# Patient Record
Sex: Female | Born: 1945 | Race: White | Hispanic: No | Marital: Married | Smoking: Former smoker
Health system: Southern US, Community
[De-identification: ages and names within clinical notes are randomized; demographics above are authoritative.]

## PROBLEM LIST (undated history)

## (undated) DIAGNOSIS — I10 Essential (primary) hypertension: Secondary | ICD-10-CM

## (undated) DIAGNOSIS — F419 Anxiety disorder, unspecified: Secondary | ICD-10-CM

## (undated) DIAGNOSIS — E785 Hyperlipidemia, unspecified: Secondary | ICD-10-CM

## (undated) DIAGNOSIS — N3281 Overactive bladder: Secondary | ICD-10-CM

## (undated) DIAGNOSIS — M1990 Unspecified osteoarthritis, unspecified site: Secondary | ICD-10-CM

## (undated) DIAGNOSIS — F32A Depression, unspecified: Secondary | ICD-10-CM

## (undated) DIAGNOSIS — S82899A Other fracture of unspecified lower leg, initial encounter for closed fracture: Secondary | ICD-10-CM

## (undated) DIAGNOSIS — F329 Major depressive disorder, single episode, unspecified: Secondary | ICD-10-CM

## (undated) DIAGNOSIS — K5792 Diverticulitis of intestine, part unspecified, without perforation or abscess without bleeding: Secondary | ICD-10-CM

## (undated) DIAGNOSIS — F319 Bipolar disorder, unspecified: Secondary | ICD-10-CM

## (undated) HISTORY — PX: COLONOSCOPY: SHX174

## (undated) HISTORY — PX: JOINT REPLACEMENT: SHX530

---
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
## 1974-09-19 HISTORY — PX: BARTHOLIN GLAND CYST EXCISION: SHX565

## 1993-09-19 HISTORY — PX: ELBOW SURGERY: SHX618

## 1996-09-19 HISTORY — PX: ABDOMINAL HYSTERECTOMY: SHX81

## 1998-09-19 HISTORY — PX: KNEE SURGERY: SHX244

## 2006-09-19 HISTORY — PX: TOTAL HIP ARTHROPLASTY: SHX124

## 2007-09-20 HISTORY — PX: JOINT REPLACEMENT: SHX530

## 2007-09-20 HISTORY — PX: PELVIC FLOOR REPAIR: SHX2192

## 2009-09-19 HISTORY — PX: JOINT REPLACEMENT: SHX530

## 2013-09-18 ENCOUNTER — Emergency Department: Payer: Self-pay | Admitting: Emergency Medicine

## 2013-09-18 LAB — TSH: Thyroid Stimulating Horm: 0.95 u[IU]/mL

## 2013-09-18 LAB — CBC
HCT: 38.4 % (ref 35.0–47.0)
HGB: 12.9 g/dL (ref 12.0–16.0)
MCH: 31.9 pg (ref 26.0–34.0)
MCHC: 33.6 g/dL (ref 32.0–36.0)
MCV: 95 fL (ref 80–100)
Platelet: 233 10*3/uL (ref 150–440)
RBC: 4.06 10*6/uL (ref 3.80–5.20)
RDW: 13.4 % (ref 11.5–14.5)
WBC: 6.2 10*3/uL (ref 3.6–11.0)

## 2013-09-18 LAB — COMPREHENSIVE METABOLIC PANEL
Albumin: 3.7 g/dL (ref 3.4–5.0)
Alkaline Phosphatase: 80 U/L
Anion Gap: 4 — ABNORMAL LOW (ref 7–16)
BUN: 17 mg/dL (ref 7–18)
Bilirubin,Total: 0.5 mg/dL (ref 0.2–1.0)
Calcium, Total: 8.9 mg/dL (ref 8.5–10.1)
Chloride: 100 mmol/L (ref 98–107)
Co2: 29 mmol/L (ref 21–32)
Creatinine: 0.86 mg/dL (ref 0.60–1.30)
EGFR (African American): >60
EGFR (Non-African Amer.): >60
Glucose: 97 mg/dL (ref 65–99)
Osmolality: 268 (ref 275–301)
Potassium: 3.9 mmol/L (ref 3.5–5.1)
SGOT(AST): 30 U/L (ref 15–37)
SGPT (ALT): 27 U/L (ref 12–78)
Sodium: 133 mmol/L — ABNORMAL LOW (ref 136–145)
Total Protein: 7.3 g/dL (ref 6.4–8.2)

## 2013-09-18 LAB — URINALYSIS, COMPLETE
Bilirubin,UR: NEGATIVE
Blood: NEGATIVE
Glucose,UR: NEGATIVE mg/dL (ref 0–75)
Ketone: NEGATIVE
Leukocyte Esterase: NEGATIVE
Nitrite: NEGATIVE
Ph: 8 (ref 4.5–8.0)
Protein: NEGATIVE
RBC,UR: 1 /HPF (ref 0–5)
Specific Gravity: 1.004 (ref 1.003–1.030)
Squamous Epithelial: <1
WBC UR: NONE SEEN /HPF (ref 0–5)

## 2013-09-18 LAB — ACETAMINOPHEN LEVEL: Acetaminophen: <2 ug/mL

## 2013-09-18 LAB — DRUG SCREEN, URINE
Amphetamines, Ur Screen: POSITIVE (ref ?–1000)
Barbiturates, Ur Screen: NEGATIVE (ref ?–200)
Benzodiazepine, Ur Scrn: NEGATIVE (ref ?–200)
Cannabinoid 50 Ng, Ur ~~LOC~~: NEGATIVE (ref ?–50)
Cocaine Metabolite,Ur ~~LOC~~: NEGATIVE (ref ?–300)
MDMA (Ecstasy)Ur Screen: POSITIVE (ref ?–500)
Methadone, Ur Screen: NEGATIVE (ref ?–300)
Opiate, Ur Screen: NEGATIVE (ref ?–300)
Phencyclidine (PCP) Ur S: NEGATIVE (ref ?–25)
Tricyclic, Ur Screen: NEGATIVE (ref ?–1000)

## 2013-09-18 LAB — PROTIME-INR
INR: 1
Prothrombin Time: 12.6 secs (ref 11.5–14.7)

## 2013-09-18 LAB — SALICYLATE LEVEL: Salicylates, Serum: <1.7 mg/dL

## 2013-09-18 LAB — ETHANOL
Ethanol %: <0.003 % (ref 0.000–0.080)
Ethanol: <3 mg/dL

## 2013-09-18 LAB — CK TOTAL AND CKMB (NOT AT ARMC)
CK, Total: 45 U/L (ref 21–215)
CK-MB: 0.5 ng/mL (ref 0.5–3.6)

## 2014-09-19 HISTORY — PX: REPLACEMENT TOTAL KNEE: SUR1224

## 2016-10-31 DIAGNOSIS — I10 Essential (primary) hypertension: Secondary | ICD-10-CM | POA: Insufficient documentation

## 2016-10-31 DIAGNOSIS — F319 Bipolar disorder, unspecified: Secondary | ICD-10-CM | POA: Insufficient documentation

## 2016-10-31 DIAGNOSIS — M159 Polyosteoarthritis, unspecified: Secondary | ICD-10-CM | POA: Insufficient documentation

## 2017-01-04 ENCOUNTER — Encounter: Payer: Self-pay | Admitting: *Deleted

## 2017-01-04 ENCOUNTER — Encounter
Admission: RE | Admit: 2017-01-04 | Discharge: 2017-01-04 | Disposition: A | Discharge status: Home / Self Care | Payer: Medicare Other | Source: Ambulatory Visit | Attending: Orthopedic Surgery | Admitting: Orthopedic Surgery

## 2017-01-04 DIAGNOSIS — F419 Anxiety disorder, unspecified: Secondary | ICD-10-CM | POA: Diagnosis not present

## 2017-01-04 DIAGNOSIS — Z01810 Encounter for preprocedural cardiovascular examination: Secondary | ICD-10-CM | POA: Diagnosis present

## 2017-01-04 DIAGNOSIS — N3281 Overactive bladder: Secondary | ICD-10-CM | POA: Insufficient documentation

## 2017-01-04 DIAGNOSIS — M1990 Unspecified osteoarthritis, unspecified site: Secondary | ICD-10-CM | POA: Diagnosis not present

## 2017-01-04 DIAGNOSIS — Z01812 Encounter for preprocedural laboratory examination: Secondary | ICD-10-CM | POA: Diagnosis not present

## 2017-01-04 DIAGNOSIS — F319 Bipolar disorder, unspecified: Secondary | ICD-10-CM | POA: Diagnosis not present

## 2017-01-04 DIAGNOSIS — I10 Essential (primary) hypertension: Secondary | ICD-10-CM | POA: Diagnosis not present

## 2017-01-04 DIAGNOSIS — E785 Hyperlipidemia, unspecified: Secondary | ICD-10-CM | POA: Insufficient documentation

## 2017-01-04 DIAGNOSIS — K5792 Diverticulitis of intestine, part unspecified, without perforation or abscess without bleeding: Secondary | ICD-10-CM | POA: Insufficient documentation

## 2017-01-04 HISTORY — DX: Hyperlipidemia, unspecified: E78.5

## 2017-01-04 HISTORY — DX: Unspecified osteoarthritis, unspecified site: M19.90

## 2017-01-04 HISTORY — DX: Diverticulitis of intestine, part unspecified, without perforation or abscess without bleeding: K57.92

## 2017-01-04 HISTORY — DX: Overactive bladder: N32.81

## 2017-01-04 HISTORY — DX: Depression, unspecified: F32.A

## 2017-01-04 HISTORY — DX: Major depressive disorder, single episode, unspecified: F32.9

## 2017-01-04 HISTORY — DX: Essential (primary) hypertension: I10

## 2017-01-04 HISTORY — DX: Anxiety disorder, unspecified: F41.9

## 2017-01-04 HISTORY — DX: Bipolar disorder, unspecified: F31.9

## 2017-01-04 HISTORY — DX: Other fracture of unspecified lower leg, initial encounter for closed fracture: S82.899A

## 2017-01-04 LAB — URINALYSIS, ROUTINE W REFLEX MICROSCOPIC
Bilirubin Urine: NEGATIVE
Glucose, UA: NEGATIVE mg/dL
Hgb urine dipstick: NEGATIVE
Ketones, ur: NEGATIVE mg/dL
Leukocytes, UA: NEGATIVE
Nitrite: NEGATIVE
Protein, ur: NEGATIVE mg/dL
Specific Gravity, Urine: 1.005 (ref 1.005–1.030)
pH: 7 (ref 5.0–8.0)

## 2017-01-04 LAB — CBC
HCT: 40.4 % (ref 35.0–47.0)
Hemoglobin: 13.5 g/dL (ref 12.0–16.0)
MCH: 32 pg (ref 26.0–34.0)
MCHC: 33.5 g/dL (ref 32.0–36.0)
MCV: 95.6 fL (ref 80.0–100.0)
Platelets: 246 10*3/uL (ref 150–440)
RBC: 4.22 MIL/uL (ref 3.80–5.20)
RDW: 13.5 % (ref 11.5–14.5)
WBC: 10.6 10*3/uL (ref 3.6–11.0)

## 2017-01-04 LAB — BASIC METABOLIC PANEL
Anion gap: 9 (ref 5–15)
BUN: 19 mg/dL (ref 6–20)
CO2: 31 mmol/L (ref 22–32)
Calcium: 10.4 mg/dL — ABNORMAL HIGH (ref 8.9–10.3)
Chloride: 95 mmol/L — ABNORMAL LOW (ref 101–111)
Creatinine, Ser: 0.97 mg/dL (ref 0.44–1.00)
GFR calc Af Amer: >60 mL/min (ref >60)
GFR calc non Af Amer: 58 mL/min — ABNORMAL LOW (ref >60)
Glucose, Bld: 97 mg/dL (ref 65–99)
Potassium: 3.6 mmol/L (ref 3.5–5.1)
Sodium: 135 mmol/L (ref 135–145)

## 2017-01-04 LAB — PROTIME-INR
INR: 0.99
Prothrombin Time: 13.1 seconds (ref 11.4–15.2)

## 2017-01-04 LAB — SEDIMENTATION RATE: Sed Rate: 12 mm/hr (ref 0–30)

## 2017-01-04 LAB — TYPE AND SCREEN
ABO/RH(D): AB POS
Antibody Screen: NEGATIVE

## 2017-01-04 LAB — SURGICAL PCR SCREEN
MRSA, PCR: NEGATIVE
Staphylococcus aureus: NEGATIVE

## 2017-01-04 LAB — APTT: aPTT: 26 seconds (ref 24–36)

## 2017-01-04 NOTE — Patient Instructions (Signed)
  Your procedure is scheduled on: Tuesday, January 10, 2017 Report to Same Day Surgery 2nd floor medical mall (Medical Mall Entrance-take elevator on left to 2nd floor.  Check in with surgery information desk.) To find out your arrival time please call [phone] between 1PM - 3PM on Monday, April 23rd  Remember: Instructions that are not followed completely may result in serious medical risk, up to and including death, or upon the discretion of your surgeon and anesthesiologist your surgery may need to be rescheduled.    _x___ 1. Do not eat food or drink liquids after midnight. No gum chewing or hard candies.     __x__ 2. No Alcohol for 24 hours before or after surgery.   __x__3. No Smoking for 24 prior to surgery.   ____  4. Bring all medications with you on the day of surgery if instructed.    __x__ 5. Notify your doctor if there is any change in your medical condition     (cold, fever, infections).     Do not wear jewelry, make-up, hairpins, clips or nail polish.  Do not wear lotions, powders, or perfumes. You may wear deodorant.  Do not shave 48 hours prior to surgery. Men may shave face and neck.  Do not bring valuables to the hospital.    [facility] is not responsible for any belongings or valuables.               Contacts, dentures or bridgework may not be worn into surgery.  Leave your suitcase in the car. After surgery it may be brought to your room.  For patients admitted to the hospital, discharge time is determined by your treatment team.   Patients discharged the day of surgery will not be allowed to drive home.  You will need someone to drive you home and stay with you the night of your procedure.    Please read over the following fact sheets that you were given:   [facility] Preparing for Surgery and or MRSA Information   _x___ Take these medications the morning of surgery with a SIP of water only:   1. CELEXA  2. PROPRANOLOL  3.  4.  5.  6.   _x___ Use  CHG Soap or sage wipes as directed on instruction sheet   X____Stop Anti-inflammatories such as Advil, Aleve, Ibuprofen, Motrin, Naproxen, Naprosyn, Goodies powders or aspirin products. OK to take Tylenol.   _x___ Stop supplements until after surgery.  But may continue Vitamin D, Vitamin B,  and multivitamin.

## 2017-01-05 LAB — URINE CULTURE: Culture: NO GROWTH

## 2017-01-10 ENCOUNTER — Inpatient Hospital Stay: Payer: Medicare Other | Admitting: Anesthesiology

## 2017-01-10 ENCOUNTER — Inpatient Hospital Stay: Payer: Medicare Other

## 2017-01-10 ENCOUNTER — Inpatient Hospital Stay
Admission: RE | Admit: 2017-01-10 | Discharge: 2017-01-12 | DRG: 470 | Disposition: A | Discharge status: Home Health | Payer: Medicare Other | Source: Ambulatory Visit | Attending: Orthopedic Surgery | Admitting: Orthopedic Surgery

## 2017-01-10 ENCOUNTER — Encounter: Payer: Self-pay | Admitting: *Deleted

## 2017-01-10 ENCOUNTER — Encounter
Admission: RE | Disposition: A | Discharge status: Home Health | Payer: Self-pay | Source: Ambulatory Visit | Attending: Orthopedic Surgery

## 2017-01-10 DIAGNOSIS — F319 Bipolar disorder, unspecified: Secondary | ICD-10-CM | POA: Diagnosis present

## 2017-01-10 DIAGNOSIS — D62 Acute posthemorrhagic anemia: Secondary | ICD-10-CM | POA: Diagnosis not present

## 2017-01-10 DIAGNOSIS — Z87891 Personal history of nicotine dependence: Secondary | ICD-10-CM

## 2017-01-10 DIAGNOSIS — M1611 Unilateral primary osteoarthritis, right hip: Secondary | ICD-10-CM | POA: Diagnosis present

## 2017-01-10 DIAGNOSIS — I10 Essential (primary) hypertension: Secondary | ICD-10-CM | POA: Diagnosis present

## 2017-01-10 DIAGNOSIS — N3281 Overactive bladder: Secondary | ICD-10-CM | POA: Diagnosis present

## 2017-01-10 DIAGNOSIS — E785 Hyperlipidemia, unspecified: Secondary | ICD-10-CM | POA: Diagnosis present

## 2017-01-10 DIAGNOSIS — M25551 Pain in right hip: Secondary | ICD-10-CM | POA: Diagnosis present

## 2017-01-10 DIAGNOSIS — G8918 Other acute postprocedural pain: Secondary | ICD-10-CM

## 2017-01-10 DIAGNOSIS — F419 Anxiety disorder, unspecified: Secondary | ICD-10-CM | POA: Diagnosis present

## 2017-01-10 DIAGNOSIS — Z419 Encounter for procedure for purposes other than remedying health state, unspecified: Secondary | ICD-10-CM

## 2017-01-10 HISTORY — PX: TOTAL HIP ARTHROPLASTY: SHX124

## 2017-01-10 LAB — CREATININE, SERUM
Creatinine, Ser: 0.95 mg/dL (ref 0.44–1.00)
GFR calc Af Amer: >60 mL/min (ref >60)
GFR calc non Af Amer: 59 mL/min — ABNORMAL LOW (ref >60)

## 2017-01-10 LAB — CBC
HCT: 34.3 % — ABNORMAL LOW (ref 35.0–47.0)
Hemoglobin: 11.5 g/dL — ABNORMAL LOW (ref 12.0–16.0)
MCH: 31.8 pg (ref 26.0–34.0)
MCHC: 33.6 g/dL (ref 32.0–36.0)
MCV: 94.6 fL (ref 80.0–100.0)
Platelets: 202 10*3/uL (ref 150–440)
RBC: 3.63 MIL/uL — ABNORMAL LOW (ref 3.80–5.20)
RDW: 13.6 % (ref 11.5–14.5)
WBC: 11.5 10*3/uL — ABNORMAL HIGH (ref 3.6–11.0)

## 2017-01-10 LAB — ABO/RH: ABO/RH(D): AB POS

## 2017-01-10 SURGERY — ARTHROPLASTY, HIP, TOTAL, ANTERIOR APPROACH
Anesthesia: Spinal | Site: Hip | Laterality: Right | Wound class: Clean

## 2017-01-10 MED ORDER — CALCIUM CARBONATE ANTACID 500 MG PO CHEW
400.0000 mg | CHEWABLE_TABLET | Freq: Every day | ORAL | Status: DC
  Administered 2017-01-11 – 2017-01-12 (×2): 400 mg via ORAL
  Filled 2017-01-10 (×2): qty 2

## 2017-01-10 MED ORDER — ACETAMINOPHEN 325 MG PO TABS: 650.0000 mg | ORAL_TABLET | Freq: Four times a day (QID) | ORAL | Status: DC | PRN

## 2017-01-10 MED ORDER — TRANEXAMIC ACID 1000 MG/10ML IV SOLN
1000.0000 mg | INTRAVENOUS | Status: DC
  Filled 2017-01-10: qty 10

## 2017-01-10 MED ORDER — ESTRADIOL 1 MG PO TABS
1.0000 mg | ORAL_TABLET | ORAL | Status: DC
  Administered 2017-01-12: 1 mg via ORAL
  Filled 2017-01-10: qty 1

## 2017-01-10 MED ORDER — PROPOFOL 500 MG/50ML IV EMUL
INTRAVENOUS | Status: DC | PRN
  Administered 2017-01-10: 25 ug/kg/min via INTRAVENOUS

## 2017-01-10 MED ORDER — CITALOPRAM HYDROBROMIDE 20 MG PO TABS
20.0000 mg | ORAL_TABLET | Freq: Every day | ORAL | Status: DC
  Administered 2017-01-10 – 2017-01-12 (×3): 20 mg via ORAL
  Filled 2017-01-10 (×3): qty 1

## 2017-01-10 MED ORDER — ONDANSETRON HCL 4 MG PO TABS: 4.0000 mg | ORAL_TABLET | Freq: Four times a day (QID) | ORAL | Status: DC | PRN

## 2017-01-10 MED ORDER — PHENOL 1.4 % MT LIQD
1.0000 | OROMUCOSAL | Status: DC | PRN
Start: 2017-01-10 — End: 2017-01-12

## 2017-01-10 MED ORDER — MORPHINE SULFATE (PF) 2 MG/ML IV SOLN: 2.0000 mg | INTRAVENOUS | Status: DC | PRN

## 2017-01-10 MED ORDER — FAMOTIDINE 20 MG PO TABS
ORAL_TABLET | ORAL | Status: AC
  Filled 2017-01-10: qty 1

## 2017-01-10 MED ORDER — MENTHOL 3 MG MT LOZG: 1.0000 | LOZENGE | OROMUCOSAL | Status: DC | PRN

## 2017-01-10 MED ORDER — EPHEDRINE SULFATE 50 MG/ML IJ SOLN
INTRAMUSCULAR | Status: DC | PRN
  Administered 2017-01-10: 5 mg via INTRAVENOUS
  Administered 2017-01-10: 10 mg via INTRAVENOUS

## 2017-01-10 MED ORDER — ENOXAPARIN SODIUM 40 MG/0.4ML ~~LOC~~ SOLN
40.0000 mg | SUBCUTANEOUS | Status: DC
  Administered 2017-01-11 – 2017-01-12 (×2): 40 mg via SUBCUTANEOUS
  Filled 2017-01-10 (×2): qty 0.4

## 2017-01-10 MED ORDER — BUPIVACAINE-EPINEPHRINE 0.25% -1:200000 IJ SOLN
INTRAMUSCULAR | Status: DC | PRN
  Administered 2017-01-10: 30 mL

## 2017-01-10 MED ORDER — OXYCODONE HCL 5 MG PO TABS: 5.0000 mg | ORAL_TABLET | ORAL | Status: DC | PRN

## 2017-01-10 MED ORDER — TEMAZEPAM 15 MG PO CAPS
15.0000 mg | ORAL_CAPSULE | Freq: Every day | ORAL | Status: DC
  Administered 2017-01-10 – 2017-01-11 (×2): 15 mg via ORAL
  Filled 2017-01-10 (×2): qty 1

## 2017-01-10 MED ORDER — SPIRONOLACTONE 100 MG PO TABS
100.0000 mg | ORAL_TABLET | Freq: Every day | ORAL | Status: DC
  Administered 2017-01-10 – 2017-01-11 (×2): 100 mg via ORAL
  Filled 2017-01-10 (×2): qty 1

## 2017-01-10 MED ORDER — SODIUM CHLORIDE 0.9 % IV SOLN
INTRAVENOUS | Status: DC
  Administered 2017-01-10 – 2017-01-11 (×3): via INTRAVENOUS

## 2017-01-10 MED ORDER — METHOCARBAMOL 500 MG PO TABS: 500.0000 mg | ORAL_TABLET | Freq: Four times a day (QID) | ORAL | Status: DC | PRN

## 2017-01-10 MED ORDER — MAGNESIUM HYDROXIDE 400 MG/5ML PO SUSP
30.0000 mL | Freq: Every day | ORAL | Status: DC | PRN
  Administered 2017-01-12: 30 mL via ORAL
  Filled 2017-01-10: qty 30

## 2017-01-10 MED ORDER — MAGNESIUM CITRATE PO SOLN
1.0000 | Freq: Once | ORAL | Status: DC | PRN
Start: 2017-01-10 — End: 2017-01-12

## 2017-01-10 MED ORDER — SODIUM CHLORIDE 0.9 % IV SOLN
INTRAVENOUS | Status: DC | PRN
  Administered 2017-01-10: 20 ug/min via INTRAVENOUS

## 2017-01-10 MED ORDER — HYDROCODONE-ACETAMINOPHEN 5-325 MG PO TABS
2.0000 | ORAL_TABLET | ORAL | Status: DC | PRN
  Administered 2017-01-10 – 2017-01-12 (×6): 2 via ORAL
  Filled 2017-01-10 (×6): qty 2

## 2017-01-10 MED ORDER — LIDOCAINE HCL (PF) 2 % IJ SOLN
INTRAMUSCULAR | Status: AC
  Filled 2017-01-10: qty 2

## 2017-01-10 MED ORDER — PHENYLEPHRINE HCL 10 MG/ML IJ SOLN
INTRAMUSCULAR | Status: DC | PRN
  Administered 2017-01-10 (×5): 100 ug via INTRAVENOUS

## 2017-01-10 MED ORDER — FOLIC ACID 1 MG PO TABS
1.0000 mg | ORAL_TABLET | Freq: Every day | ORAL | Status: DC
  Administered 2017-01-10 – 2017-01-12 (×3): 1 mg via ORAL
  Filled 2017-01-10 (×3): qty 1

## 2017-01-10 MED ORDER — PROPOFOL 10 MG/ML IV BOLUS
INTRAVENOUS | Status: AC
  Filled 2017-01-10: qty 20

## 2017-01-10 MED ORDER — FENTANYL CITRATE (PF) 100 MCG/2ML IJ SOLN
INTRAMUSCULAR | Status: DC | PRN
  Administered 2017-01-10: 50 ug via INTRAVENOUS
  Administered 2017-01-10 (×2): 25 ug via INTRAVENOUS

## 2017-01-10 MED ORDER — METOCLOPRAMIDE HCL 10 MG PO TABS: 5.0000 mg | ORAL_TABLET | Freq: Three times a day (TID) | ORAL | Status: DC | PRN

## 2017-01-10 MED ORDER — MAGNESIUM OXIDE 400 (241.3 MG) MG PO TABS
400.0000 mg | ORAL_TABLET | Freq: Every day | ORAL | Status: DC
  Administered 2017-01-11 – 2017-01-12 (×2): 400 mg via ORAL
  Filled 2017-01-10 (×2): qty 1

## 2017-01-10 MED ORDER — CEFAZOLIN SODIUM-DEXTROSE 2-4 GM/100ML-% IV SOLN
2.0000 g | Freq: Once | INTRAVENOUS | Status: AC
Start: 2017-01-10 — End: 2017-01-10
  Administered 2017-01-10: 2 g via INTRAVENOUS

## 2017-01-10 MED ORDER — PHENYLEPHRINE HCL 10 MG/ML IJ SOLN
INTRAMUSCULAR | Status: AC
  Filled 2017-01-10: qty 1

## 2017-01-10 MED ORDER — DEXTROSE 5 % IV SOLN
2.0000 g | Freq: Four times a day (QID) | INTRAVENOUS | Status: AC
  Administered 2017-01-10 – 2017-01-11 (×3): 2 g via INTRAVENOUS
  Filled 2017-01-10 (×3): qty 2000

## 2017-01-10 MED ORDER — MIDAZOLAM HCL 2 MG/2ML IJ SOLN
INTRAMUSCULAR | Status: AC
  Filled 2017-01-10: qty 2

## 2017-01-10 MED ORDER — METOCLOPRAMIDE HCL 5 MG/ML IJ SOLN: 5.0000 mg | Freq: Three times a day (TID) | INTRAMUSCULAR | Status: DC | PRN

## 2017-01-10 MED ORDER — BUPIVACAINE-EPINEPHRINE (PF) 0.25% -1:200000 IJ SOLN
INTRAMUSCULAR | Status: AC
  Filled 2017-01-10: qty 30

## 2017-01-10 MED ORDER — DIPHENHYDRAMINE HCL 12.5 MG/5ML PO ELIX: 12.5000 mg | ORAL_SOLUTION | ORAL | Status: DC | PRN

## 2017-01-10 MED ORDER — MIDAZOLAM HCL 5 MG/5ML IJ SOLN
INTRAMUSCULAR | Status: DC | PRN
  Administered 2017-01-10 (×2): 1 mg via INTRAVENOUS
  Administered 2017-01-10: 2 mg via INTRAVENOUS

## 2017-01-10 MED ORDER — LACTATED RINGERS IV SOLN
INTRAVENOUS | Status: DC
  Administered 2017-01-10 (×2): via INTRAVENOUS

## 2017-01-10 MED ORDER — CALCIUM CARBONATE-VITAMIN D 500-125 MG-UNIT PO TABS: 1.0000 | ORAL_TABLET | Freq: Every day | ORAL | Status: DC

## 2017-01-10 MED ORDER — ZOLPIDEM TARTRATE 5 MG PO TABS: 5.0000 mg | ORAL_TABLET | Freq: Every evening | ORAL | Status: DC | PRN

## 2017-01-10 MED ORDER — VITAMIN D 1000 UNITS PO TABS
1000.0000 [IU] | ORAL_TABLET | Freq: Every day | ORAL | Status: DC
  Administered 2017-01-11 – 2017-01-12 (×2): 1000 [IU] via ORAL
  Filled 2017-01-10 (×2): qty 1

## 2017-01-10 MED ORDER — AMPHETAMINE-DEXTROAMPHETAMINE 5 MG PO TABS
10.0000 mg | ORAL_TABLET | Freq: Every day | ORAL | Status: DC
  Administered 2017-01-10 – 2017-01-12 (×3): 10 mg via ORAL
  Filled 2017-01-10 (×3): qty 2

## 2017-01-10 MED ORDER — EPHEDRINE SULFATE 50 MG/ML IJ SOLN
INTRAMUSCULAR | Status: AC
  Filled 2017-01-10: qty 1

## 2017-01-10 MED ORDER — BUPIVACAINE HCL (PF) 0.5 % IJ SOLN
INTRAMUSCULAR | Status: DC | PRN
  Administered 2017-01-10: 3 mL via INTRATHECAL

## 2017-01-10 MED ORDER — GLYCOPYRROLATE 0.2 MG/ML IJ SOLN
INTRAMUSCULAR | Status: DC | PRN
  Administered 2017-01-10: 0.2 mg via INTRAVENOUS

## 2017-01-10 MED ORDER — BUPIVACAINE HCL (PF) 0.5 % IJ SOLN
INTRAMUSCULAR | Status: AC
  Filled 2017-01-10: qty 10

## 2017-01-10 MED ORDER — ONDANSETRON HCL 4 MG/2ML IJ SOLN: 4.0000 mg | Freq: Four times a day (QID) | INTRAMUSCULAR | Status: DC | PRN

## 2017-01-10 MED ORDER — CEFAZOLIN SODIUM-DEXTROSE 2-4 GM/100ML-% IV SOLN: 2.0000 g | Freq: Four times a day (QID) | INTRAVENOUS | Status: DC

## 2017-01-10 MED ORDER — FENTANYL CITRATE (PF) 100 MCG/2ML IJ SOLN: 25.0000 ug | INTRAMUSCULAR | Status: DC | PRN

## 2017-01-10 MED ORDER — GLYCOPYRROLATE 0.2 MG/ML IJ SOLN
INTRAMUSCULAR | Status: AC
  Filled 2017-01-10: qty 1

## 2017-01-10 MED ORDER — LAMOTRIGINE 100 MG PO TABS
200.0000 mg | ORAL_TABLET | Freq: Every day | ORAL | Status: DC
Start: 2017-01-10 — End: 2017-01-12
  Administered 2017-01-10 – 2017-01-11 (×2): 200 mg via ORAL
  Filled 2017-01-10: qty 2
  Filled 2017-01-10: qty 1
  Filled 2017-01-10: qty 2

## 2017-01-10 MED ORDER — BISACODYL 10 MG RE SUPP
10.0000 mg | Freq: Every day | RECTAL | Status: DC | PRN
  Filled 2017-01-10: qty 1

## 2017-01-10 MED ORDER — ACETAMINOPHEN 650 MG RE SUPP: 650.0000 mg | Freq: Four times a day (QID) | RECTAL | Status: DC | PRN

## 2017-01-10 MED ORDER — NEOMYCIN-POLYMYXIN B GU 40-200000 IR SOLN
Status: DC | PRN
  Administered 2017-01-10: 4 mL

## 2017-01-10 MED ORDER — FERROUS SULFATE 325 (65 FE) MG PO TABS
325.0000 mg | ORAL_TABLET | Freq: Every day | ORAL | Status: DC
  Administered 2017-01-11 – 2017-01-12 (×2): 325 mg via ORAL
  Filled 2017-01-10 (×2): qty 1

## 2017-01-10 MED ORDER — LIDOCAINE HCL (PF) 2 % IJ SOLN
INTRAMUSCULAR | Status: DC | PRN
  Administered 2017-01-10: 50 mg

## 2017-01-10 MED ORDER — PROPRANOLOL HCL ER 60 MG PO CP24
60.0000 mg | ORAL_CAPSULE | Freq: Every day | ORAL | Status: DC
  Administered 2017-01-11 – 2017-01-12 (×2): 60 mg via ORAL
  Filled 2017-01-10 (×2): qty 1

## 2017-01-10 MED ORDER — PRAVASTATIN SODIUM 40 MG PO TABS
40.0000 mg | ORAL_TABLET | Freq: Every day | ORAL | Status: DC
  Administered 2017-01-10 – 2017-01-11 (×2): 40 mg via ORAL
  Filled 2017-01-10 (×2): qty 1

## 2017-01-10 MED ORDER — DARIFENACIN HYDROBROMIDE ER 7.5 MG PO TB24
15.0000 mg | ORAL_TABLET | Freq: Every day | ORAL | Status: DC
  Administered 2017-01-10 – 2017-01-12 (×3): 15 mg via ORAL
  Filled 2017-01-10 (×3): qty 2

## 2017-01-10 MED ORDER — FENTANYL CITRATE (PF) 100 MCG/2ML IJ SOLN
INTRAMUSCULAR | Status: AC
  Filled 2017-01-10: qty 2

## 2017-01-10 MED ORDER — DOCUSATE SODIUM 100 MG PO CAPS
100.0000 mg | ORAL_CAPSULE | Freq: Two times a day (BID) | ORAL | Status: DC
  Administered 2017-01-10 – 2017-01-12 (×5): 100 mg via ORAL
  Filled 2017-01-10 (×5): qty 1

## 2017-01-10 MED ORDER — DEXTROSE 5 % IV SOLN
500.0000 mg | Freq: Four times a day (QID) | INTRAVENOUS | Status: DC | PRN
  Filled 2017-01-10: qty 5

## 2017-01-10 MED ORDER — NEOMYCIN-POLYMYXIN B GU 40-200000 IR SOLN
Status: AC
  Filled 2017-01-10: qty 4

## 2017-01-10 MED ORDER — VITAMIN B-12 1000 MCG PO TABS
1000.0000 ug | ORAL_TABLET | Freq: Every day | ORAL | Status: DC
  Administered 2017-01-11 – 2017-01-12 (×2): 1000 ug via ORAL
  Filled 2017-01-10 (×2): qty 1

## 2017-01-10 MED ORDER — ADULT MULTIVITAMIN W/MINERALS CH
1.0000 | ORAL_TABLET | Freq: Every day | ORAL | Status: DC
  Administered 2017-01-12: 1 via ORAL
  Filled 2017-01-10 (×2): qty 1

## 2017-01-10 MED ORDER — SODIUM CHLORIDE 0.9 % IV SOLN
INTRAVENOUS | Status: DC | PRN
  Administered 2017-01-10: 1000 mg via INTRAVENOUS

## 2017-01-10 MED ORDER — CEFAZOLIN SODIUM-DEXTROSE 2-4 GM/100ML-% IV SOLN
INTRAVENOUS | Status: AC
  Filled 2017-01-10: qty 100

## 2017-01-10 MED ORDER — FAMOTIDINE 20 MG PO TABS
20.0000 mg | ORAL_TABLET | Freq: Once | ORAL | Status: AC
  Administered 2017-01-10: 20 mg via ORAL

## 2017-01-10 MED ORDER — OMEGA-3-ACID ETHYL ESTERS 1 G PO CAPS
1.0000 g | ORAL_CAPSULE | Freq: Every day | ORAL | Status: DC
  Administered 2017-01-11 – 2017-01-12 (×2): 1 g via ORAL
  Filled 2017-01-10 (×2): qty 1

## 2017-01-10 MED ORDER — ONDANSETRON HCL 4 MG/2ML IJ SOLN: 4.0000 mg | Freq: Once | INTRAMUSCULAR | Status: DC | PRN

## 2017-01-10 SURGICAL SUPPLY — 46 items
BLADE SAW SAG 18.5X105 (BLADE) ×2 IMPLANT
BNDG COHESIVE 6X5 TAN STRL LF (GAUZE/BANDAGES/DRESSINGS) ×4 IMPLANT
CANISTER SUCT 1200ML W/VALVE (MISCELLANEOUS) ×2 IMPLANT
CAPT HIP TOTAL 3 ×2 IMPLANT
CATH FOL LEG HOLDER (MISCELLANEOUS) ×2 IMPLANT
CATH TRAY METER 16FR LF (MISCELLANEOUS) ×2 IMPLANT
CHLORAPREP W/TINT 26ML (MISCELLANEOUS) ×2 IMPLANT
DRAPE C-ARM XRAY 36X54 (DRAPES) ×2 IMPLANT
DRAPE INCISE IOBAN 66X60 STRL (DRAPES) IMPLANT
DRAPE POUCH INSTRU U-SHP 10X18 (DRAPES) ×2 IMPLANT
DRAPE SHEET LG 3/4 BI-LAMINATE (DRAPES) ×6 IMPLANT
DRAPE TABLE BACK 80X90 (DRAPES) ×2 IMPLANT
DRESSING SURGICEL FIBRLLR 1X2 (HEMOSTASIS) ×2 IMPLANT
DRSG OPSITE POSTOP 4X8 (GAUZE/BANDAGES/DRESSINGS) ×4 IMPLANT
DRSG SURGICEL FIBRILLAR 1X2 (HEMOSTASIS) ×4
ELECT BLADE 6.5 EXT (BLADE) ×2 IMPLANT
ELECT REM PT RETURN 9FT ADLT (ELECTROSURGICAL) ×2
ELECTRODE REM PT RTRN 9FT ADLT (ELECTROSURGICAL) ×1 IMPLANT
GLOVE BIOGEL PI IND STRL 9 (GLOVE) ×1 IMPLANT
GLOVE BIOGEL PI INDICATOR 9 (GLOVE) ×1
GLOVE SURG SYN 9.0  PF PI (GLOVE) ×2
GLOVE SURG SYN 9.0 PF PI (GLOVE) ×2 IMPLANT
GOWN SRG 2XL LVL 4 RGLN SLV (GOWNS) ×1 IMPLANT
GOWN STRL NON-REIN 2XL LVL4 (GOWNS) ×1
GOWN STRL REUS W/ TWL LRG LVL3 (GOWN DISPOSABLE) ×1 IMPLANT
GOWN STRL REUS W/TWL LRG LVL3 (GOWN DISPOSABLE) ×1
HEMOVAC 400CC 10FR (MISCELLANEOUS) IMPLANT
HOOD PEEL AWAY FLYTE STAYCOOL (MISCELLANEOUS) ×2 IMPLANT
MAT BLUE FLOOR 46X72 FLO (MISCELLANEOUS) ×2 IMPLANT
NDL SAFETY 18GX1.5 (NEEDLE) ×2 IMPLANT
NEEDLE SPNL 18GX3.5 QUINCKE PK (NEEDLE) ×2 IMPLANT
NS IRRIG 1000ML POUR BTL (IV SOLUTION) ×2 IMPLANT
PACK HIP COMPR (MISCELLANEOUS) ×2 IMPLANT
SOL PREP PVP 2OZ (MISCELLANEOUS) ×2
SOLUTION PREP PVP 2OZ (MISCELLANEOUS) ×1 IMPLANT
STAPLER SKIN PROX 35W (STAPLE) ×2 IMPLANT
STRAP SAFETY BODY (MISCELLANEOUS) ×2 IMPLANT
SUT DVC 2 QUILL PDO  T11 36X36 (SUTURE) ×1
SUT DVC 2 QUILL PDO T11 36X36 (SUTURE) ×1 IMPLANT
SUT SILK 0 (SUTURE) ×1
SUT SILK 0 30XBRD TIE 6 (SUTURE) ×1 IMPLANT
SUT V-LOC 90 ABS DVC 3-0 CL (SUTURE) ×2 IMPLANT
SUT VIC AB 1 CT1 36 (SUTURE) ×2 IMPLANT
SYR 20CC LL (SYRINGE) ×2 IMPLANT
SYR 30ML LL (SYRINGE) ×2 IMPLANT
TOWEL OR 17X26 4PK STRL BLUE (TOWEL DISPOSABLE) ×2 IMPLANT

## 2017-01-10 NOTE — Evaluation (Signed)
Physical Therapy Evaluation Patient Details Name: Jodi Parrish MRN: [id number] DOB: [birth year] Today's Date: 01/10/2017   History of Present Illness  71 y/o female s/p R hip replacement, anterior approach.  Clinical Impression  Pt did well with PT exam despite some minimal confusion, possibly medicine related.  She showed very good mobility getting to EOB, good confidence with getting to standing and was able to ambulate ~25 ft in the room with no real WBing hesitation or excessively increased pain.  Pt was able to do most exercises with light resistance and even did SLRs w/o assist. Overall pt did well and should be able to return home w/ assist from her husband.      Follow Up Recommendations Home health PT    Equipment Recommendations  3in1 (PT)    Recommendations for Other Services       Precautions / Restrictions Precautions Precautions: Anterior Hip Restrictions RLE Weight Bearing: Weight bearing as tolerated      Mobility  Bed Mobility Overal bed mobility: Modified Independent             General bed mobility comments: Pt was able to get both LEs to EOB with out direct assist, showed minimal hesitancy and generally was safe with the effort  Transfers Overall transfer level: Modified independent Equipment used: Rolling walker (2 wheeled)             General transfer comment: Pt needed cuing for foot/hand set up and generally for sequencing but was able to rise w/o direct assist and maintained balance well with walker  Ambulation/Gait Ambulation/Gait assistance: Min guard Ambulation Distance (Feet): 25 Feet Assistive device: Rolling walker (2 wheeled)       General Gait Details: Pt did very well with ambulation POD0, she was able to tolerate R WBing w/o increased pain, showed good swing through and nearly symetrical cadence.  Stairs            Wheelchair Mobility    Modified Rankin (Stroke Patients Only)       Balance Overall balance  assessment: Modified Independent                                           Pertinent Vitals/Pain Pain Assessment: 0-10 Pain Score: 3  Pain Location: minimally increased pain with activity, pt tolerates well    Home Living Family/patient expects to be discharged to:: Private residence Living Arrangements: Spouse/significant other Available Help at Discharge: Family   Home Access: Stairs to enter   Secretary/administrator of Steps: 1 Home Layout: One level Home Equipment: Environmental consultant - 2 wheels      Prior Function Level of Independence: Independent         Comments: reports she has been walking less recently because of hip pain but generally is able to be very active     Hand Dominance        Extremity/Trunk Assessment   Upper Extremity Assessment Upper Extremity Assessment: Overall WFL for tasks assessed    Lower Extremity Assessment Lower Extremity Assessment: Overall WFL for tasks assessed;RLE deficits/detail RLE Deficits / Details: Pt had some expected post op R LE weakness, but was able to do most activities with light resistance and even did 10 SLRs w/o assist and only minimal warm up RLE Sensation:  (intact post surgery)       Communication   Communication: No difficulties  Cognition Arousal/Alertness: Awake/alert Behavior During Therapy: WFL for tasks assessed/performed Overall Cognitive Status: Within Functional Limits for tasks assessed                                 General Comments: Pt did appear to have some mild confusion potentially med related      General Comments      Exercises Total Joint Exercises Ankle Circles/Pumps: AROM;10 reps Quad Sets: Strengthening;10 reps Gluteal Sets: Strengthening;10 reps Short Arc Quad: Strengthening;10 reps Heel Slides: AROM;Strengthening;10 reps Hip ABduction/ADduction: Strengthening;10 reps Straight Leg Raises: AROM;10 reps   Assessment/Plan    PT Assessment Patient  needs continued PT services  PT Problem List Decreased strength;Decreased range of motion;Decreased balance;Decreased activity tolerance;Decreased mobility;Decreased knowledge of use of DME;Decreased safety awareness;Decreased cognition       PT Treatment Interventions Gait training;DME instruction;Stair training;Functional mobility training;Therapeutic exercise;Therapeutic activities;Balance training;Patient/family education    PT Goals (Current goals can be found in the Care Plan section)  Acute Rehab PT Goals Patient Stated Goal: Go home PT Goal Formulation: With patient Time For Goal Achievement: 01/24/17 Potential to Achieve Goals: Good    Frequency BID   Barriers to discharge        Co-evaluation               End of Session Equipment Utilized During Treatment: Gait belt Activity Tolerance: Patient tolerated treatment well     PT Visit Diagnosis: Muscle weakness (generalized) (M62.81);Difficulty in walking, not elsewhere classified (R26.2)    Time: 1610-9604 PT Time Calculation (min) (ACUTE ONLY): 25 min   Charges:   PT Evaluation $PT Eval Low Complexity: 1 Procedure PT Treatments $Therapeutic Exercise: 8-22 mins   PT G Codes:        Malachi Pro, DPT 01/10/2017, 4:24 PM

## 2017-01-10 NOTE — Progress Notes (Signed)
Patient was admitted to room 150 from OR via room bed and OR staff. Foley, foot pumps, NSL,TEDs, and ice to right hip in place. Honeycomb dressing CD&I. Bed alarm on for safety. A&O x4. Reviewed POC and reviewed orders.

## 2017-01-10 NOTE — H&P (Signed)
Reviewed paper H+P, will be scanned into chart. Pt. examined No changes noted.

## 2017-01-10 NOTE — Progress Notes (Signed)
Patient is A&O x4, but forgetful POD #0 RTH. Dressing CD&I. Gave oral pain meds, with good relief. Tolerated a clear diet. Foley draining clear yellow urine.  Bed alarm on for safety.

## 2017-01-10 NOTE — Anesthesia Post-op Follow-up Note (Cosign Needed)
Anesthesia QCDR form completed.        

## 2017-01-10 NOTE — Op Note (Signed)
01/10/2017  9:18 AM  PATIENT:  Jodi Parrish  71 y.o. female  PRE-OPERATIVE DIAGNOSIS:  PRIMARY OSTEOARTHRITIS OF RIGHT HIP  POST-OPERATIVE DIAGNOSIS:  PRIMARY OSTEOARTHRITIS OF RIGHT HIP  PROCEDURE:  Procedure(s): TOTAL HIP ARTHROPLASTY ANTERIOR APPROACH (Right)  SURGEON: Leitha Schuller, MD  ASSISTANTS: None  ANESTHESIA:   spinal  EBL:  Total I/O In: 1300 [I.V.:1300] Out: 600 [Urine:300; Blood:300]  BLOOD ADMINISTERED:none  DRAINS: none   LOCAL MEDICATIONS USED:  MARCAINE     SPECIMEN:  Source of Specimen:  Right femoral head  DISPOSITION OF SPECIMEN:  PATHOLOGY  COUNTS:  YES  TOURNIQUET:  * No tourniquets in log *  IMPLANTS: Medacta AMIS 5 standard with Mpact 56 DM with liner and S 28 mm head  DICTATION: .Dragon Dictation   The patient was brought to the operating room and after spinal anesthesia was obtained patient was placed on the operative table with the ipsilateral foot into the Medacta attachment, contralateral leg on a well-padded table. C-arm was brought in and preop template x-ray taken. After prepping and draping in usual sterile fashion appropriate patient identification and timeout procedures were completed. Anterior approach to the hip was obtained and centered over the greater trochanter and TFL muscle. The subcutaneous tissue was incised hemostasis being achieved by electrocautery. TFL fascia was incised and the muscle retracted laterally deep retractor placed. The lateral femoral circumflex vessels were identified and ligated. The anterior capsule was exposed and a capsulotomy performed. The neck was identified and a femoral neck cut carried out with a saw. The head was removed without difficulty and showed sclerotic femoral head and acetabulum. Reaming was carried out to 54 mm and a 56 mm cup trial gave appropriate tightness to the acetabular component a 56 DM cup was impacted into position. The leg was then externally rotated and ischiofemoral and  pubofemoral releases carried out. The femur was sequentially broached to a size 5, size 5 standard width S head trials were placed and the final components chosen. The 5 standard stem was inserted along with a S 28 mm head and 56 mm liner. The hip was reduced and was stable the wound was thoroughly irrigated, with fibrillar packed along the posterior capsular incision and along the medial calcar to aid in postop hemostasis. The deep fascia view was closed using a heavy Quill after infiltration of 30 cc of quarter percent Sensorcaine with epinephrine. 3-0 v-loc to close the skin with skin staples Xeroform  and honeycomb dressing applied  PLAN OF CARE: Admit to inpatient

## 2017-01-10 NOTE — Transfer of Care (Signed)
Immediate Anesthesia Transfer of Care Note  Patient: Jodi Parrish  Procedure(s) Performed: Procedure(s): TOTAL HIP ARTHROPLASTY ANTERIOR APPROACH (Right)  Patient Location: PACU  Anesthesia Type:Spinal  Level of Consciousness: awake, alert  and oriented  Airway & Oxygen Therapy: Patient Spontanous Breathing  Post-op Assessment: Report given to RN and Post -op Vital signs reviewed and stable  Post vital signs: Reviewed  Last Vitals:  Vitals:   01/10/17 0632 01/10/17 0911  BP: 134/64 113/66  Pulse: 84 71  Resp: 16 12  Temp: 36.8 C     Last Pain:  Vitals:   01/10/17 0632  TempSrc: Oral  PainSc: 3          Complications: No apparent anesthesia complications

## 2017-01-10 NOTE — Anesthesia Procedure Notes (Signed)
Spinal  Patient location during procedure: OR Staffing Anesthesiologist: KEPHART, WILLIAM K Resident/CRNA: Gabriellia Rempel Performed: resident/CRNA  Preanesthetic Checklist Completed: patient identified, site marked, surgical consent, pre-op evaluation, timeout performed, IV checked, risks and benefits discussed and monitors and equipment checked Spinal Block Patient position: sitting Prep: ChloraPrep and site prepped and draped Patient monitoring: heart rate, continuous pulse ox, blood pressure and cardiac monitor Approach: midline Location: L4-5 Injection technique: single-shot Needle Needle type: Introducer and Pencan  Needle gauge: 24 G Needle length: 9 cm Additional Notes Negative paresthesia. Negative blood return. Positive free-flowing CSF. Expiration date of kit checked and confirmed. Patient tolerated procedure well, without complications.       

## 2017-01-10 NOTE — Anesthesia Preprocedure Evaluation (Signed)
Anesthesia Evaluation  Patient identified by MRN, date of birth, ID band Patient awake    Reviewed: Allergy & Precautions, NPO status , Patient's Chart, lab work & pertinent test results  History of Anesthesia Complications Negative for: history of anesthetic complications  Airway Mallampati: III       Dental   Pulmonary neg pulmonary ROS, former smoker,           Cardiovascular hypertension, Pt. on medications and Pt. on home beta blockers      Neuro/Psych Anxiety Depression Bipolar Disorder negative neurological ROS     GI/Hepatic negative GI ROS, Neg liver ROS,   Endo/Other  negative endocrine ROS  Renal/GU negative Renal ROS     Musculoskeletal   Abdominal   Peds  Hematology negative hematology ROS (+)   Anesthesia Other Findings   Reproductive/Obstetrics                             Anesthesia Physical Anesthesia Plan  ASA: II  Anesthesia Plan: Spinal   Post-op Pain Management:    Induction:   Airway Management Planned:   Additional Equipment:   Intra-op Plan:   Post-operative Plan:   Informed Consent: I have reviewed the patients History and Physical, chart, labs and discussed the procedure including the risks, benefits and alternatives for the proposed anesthesia with the patient or authorized representative who has indicated his/her understanding and acceptance.     Plan Discussed with:   Anesthesia Plan Comments:         Anesthesia Quick Evaluation

## 2017-01-10 NOTE — NC FL2 (Signed)
[place] MEDICAID FL2 LEVEL OF CARE SCREENING TOOL     IDENTIFICATION  Patient Name: Jodi Parrish Birthdate: April 22, 1946 Sex: female Admission Date (Current Location): 01/10/2017  [place] and [place] Number:  Chiropodist and Address:  [facility], [address] [place], [place] [address]      Provider Number: [id number]  Attending Physician Name and Address:  Kennedy Bucker, MD  Relative Name and Phone Number:       Current Level of Care: Hospital  Recommended Level of Care: Skilled Nursing Facility Prior Approval Number:    Date Approved/Denied:   PASRR Number:  [id number])  Discharge Plan: SNF    Current Diagnoses: Patient Active Problem List   Diagnosis Date Noted  . Primary osteoarthritis of right hip 01/10/2017    Orientation RESPIRATION BLADDER Height & Weight     Self, Time, Situation, Place  Normal Incontinent, Indwelling catheter Weight: 178 lb 3.2 oz (80.8 kg) Height:   (172.7 cm)  BEHAVIORAL SYMPTOMS/MOOD NEUROLOGICAL BOWEL NUTRITION STATUS   (None.)  (None.) Incontinent Diet (Diet: Clear Liquid)  AMBULATORY STATUS COMMUNICATION OF NEEDS Skin   Extensive Assist Verbally Surgical wounds (Incision: Right Hip)                       Personal Care Assistance Level of Assistance  Bathing, Feeding, Dressing Bathing Assistance: Limited assistance Feeding assistance: Independent Dressing Assistance: Limited assistance     Functional Limitations Info  Sight, Speech, Hearing Sight Info: Adequate Hearing Info: Adequate Speech Info: Adequate    SPECIAL CARE FACTORS FREQUENCY  PT (By licensed PT), OT (By licensed OT)     PT Frequency:  (5) OT Frequency:  (5)            Contractures      Additional Factors Info  Code Status, Allergies Code Status Info:  (Full Code) Allergies Info:  ( Abilify Aripiprazole, Duloxetine)           Current Medications (01/10/2017):  This is the current hospital  active medication list Current Facility-Administered Medications  Medication Dose Route Frequency Provider Last Rate Last Dose  . 0.9 %  sodium chloride infusion   Intravenous Continuous Kennedy Bucker, MD      . acetaminophen (TYLENOL) tablet 650 mg  650 mg Oral Q6H PRN Kennedy Bucker, MD       Or  . acetaminophen (TYLENOL) suppository 650 mg  650 mg Rectal Q6H PRN Kennedy Bucker, MD      . amphetamine-dextroamphetamine (ADDERALL) tablet 10 mg  10 mg Oral Daily Kennedy Bucker, MD      . bisacodyl (DULCOLAX) suppository 10 mg  10 mg Rectal Daily PRN Kennedy Bucker, MD      . calcium carbonate (TUMS - dosed in mg elemental calcium) chewable tablet 400 mg of elemental calcium  400 mg of elemental calcium Oral Daily Kennedy Bucker, MD      . ceFAZolin (ANCEF) 2 g in dextrose 5 % 100 mL IVPB  2 g Intravenous Q6H Kennedy Bucker, MD      . cholecalciferol (VITAMIN D) tablet 1,000 Units  1,000 Units Oral Daily Kennedy Bucker, MD      . citalopram (CELEXA) tablet 20 mg  20 mg Oral Daily Kennedy Bucker, MD      . darifenacin (ENABLEX) 24 hr tablet 15 mg  15 mg Oral Daily Kennedy Bucker, MD      . diphenhydrAMINE (BENADRYL) 12.5 MG/5ML elixir 12.5-25 mg  12.5-25  mg Oral Q4H PRN Kennedy Bucker, MD      . docusate sodium (COLACE) capsule 100 mg  100 mg Oral BID Kennedy Bucker, MD      . Melene Muller ON 01/11/2017] enoxaparin (LOVENOX) injection 40 mg  40 mg Subcutaneous Q24H Kennedy Bucker, MD      . Melene Muller ON 01/12/2017] estradiol (ESTRACE) tablet 1 mg  1 mg Oral Once per day on Mon Thu Kennedy Bucker, MD      . famotidine (PEPCID) 20 MG tablet           . [START ON 01/11/2017] ferrous sulfate tablet 325 mg  325 mg Oral Q breakfast Kennedy Bucker, MD      . folic acid (FOLVITE) tablet 1 mg  1 mg Oral Daily Kennedy Bucker, MD      . HYDROcodone-acetaminophen (NORCO/VICODIN) 5-325 MG per tablet 2 tablet  2 tablet Oral Q4H PRN Kennedy Bucker, MD      . lamoTRIgine (LAMICTAL) tablet 200 mg  200 mg Oral QHS Kennedy Bucker, MD      . magnesium citrate  solution 1 Bottle  1 Bottle Oral Once PRN Kennedy Bucker, MD      . magnesium hydroxide (MILK OF MAGNESIA) suspension 30 mL  30 mL Oral Daily PRN Kennedy Bucker, MD      . magnesium oxide (MAG-OX) tablet 400 mg  400 mg Oral Daily Kennedy Bucker, MD      . menthol-cetylpyridinium (CEPACOL) lozenge 3 mg  1 lozenge Oral PRN Kennedy Bucker, MD       Or  . phenol (CHLORASEPTIC) mouth spray 1 spray  1 spray Mouth/Throat PRN Kennedy Bucker, MD      . methocarbamol (ROBAXIN) tablet 500 mg  500 mg Oral Q6H PRN Kennedy Bucker, MD       Or  . methocarbamol (ROBAXIN) 500 mg in dextrose 5 % 50 mL IVPB  500 mg Intravenous Q6H PRN Kennedy Bucker, MD      . metoCLOPramide (REGLAN) tablet 5-10 mg  5-10 mg Oral Q8H PRN Kennedy Bucker, MD       Or  . metoCLOPramide (REGLAN) injection 5-10 mg  5-10 mg Intravenous Q8H PRN Kennedy Bucker, MD      . morphine 2 MG/ML injection 2 mg  2 mg Intravenous Q1H PRN Kennedy Bucker, MD      . multivitamin with minerals tablet 1 tablet  1 tablet Oral Daily Kennedy Bucker, MD      . omega-3 acid ethyl esters (LOVAZA) capsule 1 g  1 g Oral Daily Kennedy Bucker, MD      . ondansetron [facility]) tablet 4 mg  4 mg Oral Q6H PRN Kennedy Bucker, MD       Or  . ondansetron [facility]) injection 4 mg  4 mg Intravenous Q6H PRN Kennedy Bucker, MD      . oxyCODONE (Oxy IR/ROXICODONE) immediate release tablet 5-10 mg  5-10 mg Oral Q3H PRN Kennedy Bucker, MD      . pravastatin (PRAVACHOL) tablet 40 mg  40 mg Oral QHS Kennedy Bucker, MD      . Melene Muller ON 01/11/2017] propranolol ER (INDERAL LA) 24 hr capsule 60 mg  60 mg Oral Daily Kennedy Bucker, MD      . spironolactone (ALDACTONE) tablet 100 mg  100 mg Oral QHS Kennedy Bucker, MD      . temazepam (RESTORIL) capsule 15 mg  15 mg Oral QHS Kennedy Bucker, MD      . vitamin B-12 (CYANOCOBALAMIN) tablet 1,000 mcg  1,000 mcg Oral Daily Kennedy Bucker, MD      . zolpidem [facility]) tablet 5 mg  5 mg Oral QHS PRN Kennedy Bucker, MD         Discharge Medications: Please see discharge summary  for a list of discharge medications.  Relevant Imaging Results:  Relevant Lab Results:   Additional Information  (SSN: [id number])  Ralene Bathe, Student-Social Work

## 2017-01-11 LAB — CBC
HCT: 30.9 % — ABNORMAL LOW (ref 35.0–47.0)
Hemoglobin: 10.7 g/dL — ABNORMAL LOW (ref 12.0–16.0)
MCH: 33.1 pg (ref 26.0–34.0)
MCHC: 34.6 g/dL (ref 32.0–36.0)
MCV: 95.8 fL (ref 80.0–100.0)
Platelets: 165 10*3/uL (ref 150–440)
RBC: 3.22 MIL/uL — ABNORMAL LOW (ref 3.80–5.20)
RDW: 13.3 % (ref 11.5–14.5)
WBC: 8.5 10*3/uL (ref 3.6–11.0)

## 2017-01-11 LAB — BASIC METABOLIC PANEL
Anion gap: 3 — ABNORMAL LOW (ref 5–15)
BUN: 9 mg/dL (ref 6–20)
CO2: 27 mmol/L (ref 22–32)
Calcium: 8.5 mg/dL — ABNORMAL LOW (ref 8.9–10.3)
Chloride: 99 mmol/L — ABNORMAL LOW (ref 101–111)
Creatinine, Ser: 0.72 mg/dL (ref 0.44–1.00)
GFR calc Af Amer: >60 mL/min (ref >60)
GFR calc non Af Amer: >60 mL/min (ref >60)
Glucose, Bld: 119 mg/dL — ABNORMAL HIGH (ref 65–99)
Potassium: 3.9 mmol/L (ref 3.5–5.1)
Sodium: 129 mmol/L — ABNORMAL LOW (ref 135–145)

## 2017-01-11 NOTE — Plan of Care (Signed)
Problem: Safety: Goal: Ability to remain free from injury will improve Outcome: Progressing Pt knows to ask for assistance when ambulating.  Problem: Pain Managment: Goal: General experience of comfort will improve Outcome: Progressing Pain control with oral pain medication  Problem: Tissue Perfusion: Goal: Risk factors for ineffective tissue perfusion will decrease Outcome: Progressing Surgical dressing remaining dry and intact.  Problem: Fluid Volume: Goal: Ability to maintain a balanced intake and output will improve Outcome: Progressing Iv is infusing without difficulty.  Problem: Nutrition: Goal: Adequate nutrition will be maintained Outcome: Progressing Eating and drinking without difficulty.

## 2017-01-11 NOTE — Care Management Note (Signed)
Case Management Note  Patient Details  Name: Jodi Parrish MRN: [id number] Date of Birth: [birth year]  Subjective/Objective:  POD # 1 right THA. Met with patient at bedside to discuss discharge planing. Patient lives at home with her spouse. She has a walker and BSC.  Discussed home health PT and what to expect. No agency preference. Referral to [facility]. Pharmacy: [facility] [phone]. Called Lovenox 40 mg # 14 no refills. PCP is Dr. Ouida Sills.                 Action/Plan: [facility] for PT, Lovenox called in.   Expected Discharge Date:  01/12/17               Expected Discharge Plan:  [place]  In-House Referral:     Discharge planning Services  CM Consult  Post Acute Care Choice:  Home Health Choice offered to:  Patient  DME Arranged:    DME Agency:     HH Arranged:  PT [place]:  [facility] (now [facility])  Status of Service:  In process, will continue to follow  If discussed at Long Length of Stay Meetings, dates discussed:    Additional Comments:  Jolly Mango, RN 01/11/2017, 9:52 AM

## 2017-01-11 NOTE — Progress Notes (Signed)
Clinical Social Worker (CSW) received SNF consult. PT is recommending home health. RN case manager aware of above. Please reconsult if future social work needs arise. CSW signing off.   Alhassan Everingham, LCSW [phone] 

## 2017-01-11 NOTE — Progress Notes (Signed)
Physical Therapy Treatment Patient Details Name: Jodi Parrish MRN: [id number] DOB: 06/09/1946 Today's Date: 01/11/2017    History of Present Illness 71 y/o female s/p R hip replacement, anterior approach.    PT Comments    Pt is making excellent progress with mobility, ambulating 400 ft using RW.  Cues provided for posture while ambulating.  She tolerated all therapeutic exercises well.  She has a small step to enter home and will need to practice this before d/c.  Pt will benefit from continued skilled PT services to increase functional independence and safety.    Follow Up Recommendations  Home health PT     Equipment Recommendations  3in1 (PT)    Recommendations for Other Services       Precautions / Restrictions Precautions Precautions: Other (comment) Precaution Booklet Issued: No Precaution Comments: No hip precautions, pt with direct anteiror approach Restrictions Weight Bearing Restrictions: Yes RLE Weight Bearing: Weight bearing as tolerated    Mobility  Bed Mobility Overal bed mobility: Modified Independent             General bed mobility comments: Pt lifts her RLE into bed using her UEs but otherwise does not require assist or cues.  Transfers Overall transfer level: Modified independent Equipment used: Rolling walker (2 wheeled)             General transfer comment: Pt demonstrates safe technique for sit<>stand without cues needed.  Pt is slower to stand.  Ambulation/Gait Ambulation/Gait assistance: Supervision Ambulation Distance (Feet): 400 Feet Assistive device: Rolling walker (2 wheeled) Gait Pattern/deviations: Step-through pattern;Decreased weight shift to right;Decreased stride length;Decreased stance time - right;Decreased step length - left;Trunk flexed Gait velocity: decreased Gait velocity interpretation: Below normal speed for age/gender General Gait Details: Cues for upright posture and forward gaze and to relax shoulders.  Pt  with step through gait pattern without cues.    Stairs            Wheelchair Mobility    Modified Rankin (Stroke Patients Only)       Balance Overall balance assessment: Needs assistance Sitting-balance support: No upper extremity supported;Feet supported Sitting balance-Leahy Scale: Good     Standing balance support: Bilateral upper extremity supported;During functional activity Standing balance-Leahy Scale: Poor Standing balance comment: Relies on UE support for static and dynamic activities                            Cognition Arousal/Alertness: Awake/alert Behavior During Therapy: WFL for tasks assessed/performed Overall Cognitive Status: History of cognitive impairments - at baseline                                 General Comments: Pt continues to have some mild confusion.  Per nursing, this is pt's baseline.      Exercises Total Joint Exercises Ankle Circles/Pumps: AROM;10 reps;Both;Seated Hip ABduction/ADduction: Strengthening;10 reps;Both;Other (comment);Seated (isometric with pillow adduction; iso abd w/manual resistance) Straight Leg Raises: 10 reps;AAROM;Strengthening;Right;Supine Long Arc Quad: Strengthening;10 reps;Seated;Right Knee Flexion: AROM;Right;10 reps;Standing;Other (comment) (for gentle stretch) Marching in Standing: Seated;Both;10 reps    General Comments        Pertinent Vitals/Pain Pain Assessment: 0-10 Pain Score: 5  Pain Location: R hip Pain Descriptors / Indicators: Aching;Guarding Pain Intervention(s): Limited activity within patient's tolerance;Monitored during session;Repositioned    Home Living  Prior Function            PT Goals (current goals can now be found in the care plan section) Acute Rehab PT Goals Patient Stated Goal: Go home PT Goal Formulation: With patient Time For Goal Achievement: 01/24/17 Potential to Achieve Goals: Good Progress towards PT  goals: Progressing toward goals    Frequency    BID      PT Plan Current plan remains appropriate    Co-evaluation             End of Session Equipment Utilized During Treatment: Gait belt Activity Tolerance: Patient tolerated treatment well Patient left: with call bell/phone within reach;in bed;with bed alarm set;with SCD's reapplied Nurse Communication: Mobility status PT Visit Diagnosis: Muscle weakness (generalized) (M62.81);Difficulty in walking, not elsewhere classified (R26.2)     Time: 1610-9604 PT Time Calculation (min) (ACUTE ONLY): 22 min  Charges:  $Gait Training: 8-22 mins                    G Codes:       Encarnacion Chu PT, DPT 01/11/2017, 2:18 PM

## 2017-01-11 NOTE — Progress Notes (Signed)
   Subjective: 1 Day Post-Op Procedure(s) (LRB): TOTAL HIP ARTHROPLASTY ANTERIOR APPROACH (Right) Patient reports pain as mild.   Patient is well, and has had no acute complaints or problems Denies any CP, SOB, ABD pain. We will continue therapy today.  Plan is to go Home after hospital stay.  Objective: Vital signs in last 24 hours: Temp:  [96.9 F (36.1 C)-99.7 F (37.6 C)] 99.7 F (37.6 C) (04/25 0424) Pulse Rate:  [59-91] 91 (04/25 0424) Resp:  [10-18] 18 (04/25 0424) BP: (94-127)/(53-82) 120/63 (04/25 0424) SpO2:  [92 %-100 %] 97 % (04/25 0424) Weight:  [80.8 kg (178 lb 3.2 oz)] 80.8 kg (178 lb 3.2 oz) (04/24 1134)  Intake/Output from previous day: 04/24 0701 - 04/25 0700 In: 3303.3 [P.O.:480; I.V.:2823.3] Out: 3800 [Urine:3500; Blood:300] Intake/Output this shift: No intake/output data recorded.   Recent Labs  01/10/17 1202 01/11/17 0547  HGB 11.5* 10.7*    Recent Labs  01/10/17 1202 01/11/17 0547  WBC 11.5* 8.5  RBC 3.63* 3.22*  HCT 34.3* 30.9*  PLT 202 165    Recent Labs  01/10/17 1202 01/11/17 0547  NA  --  129*  K  --  3.9  CL  --  99*  CO2  --  27  BUN  --  9  CREATININE 0.95 0.72  GLUCOSE  --  119*  CALCIUM  --  8.5*   No results for input(s): LABPT, INR in the last 72 hours.  EXAM General - Patient is Alert, Appropriate and Oriented Extremity - Neurovascular intact Sensation intact distally Intact pulses distally Dorsiflexion/Plantar flexion intact No cellulitis present Compartment soft Dressing - dressing C/D/I and scant drainage Motor Function - intact, moving foot and toes well on exam.   Past Medical History:  Diagnosis Date  . Ankle fracture    right  . Anxiety   . Arthritis   . Bipolar disorder (HCC)   . Depression   . Diverticulitis   . Hyperlipidemia   . Hypertension   . Osteoarthritis   . Overactive bladder     Assessment/Plan:   1 Day Post-Op Procedure(s) (LRB): TOTAL HIP ARTHROPLASTY ANTERIOR APPROACH  (Right) Active Problems:   Primary osteoarthritis of right hip  Estimated body mass index is 27.1 kg/m as calculated from the following:   Height as of this encounter:  (1.727 m).   Weight as of this encounter: 80.8 kg (178 lb 3.2 oz). Advance diet Up with therapy  Needs BM Acute post op blood loss anemia - Hgb 10.7, Continue with Iron. Recheck in the am CM to assist with discharge.    DVT Prophylaxis - Lovenox, Foot Pumps and TED hose Weight-Bearing as tolerated to right leg   T. Cranston Neighbor, PA-C [facility] 01/11/2017, 8:22 AM

## 2017-01-11 NOTE — Progress Notes (Signed)
Physical Therapy Treatment Patient Details Name: Jodi Parrish MRN: [id number] DOB: 1945-10-06 Today's Date: 01/11/2017    History of Present Illness 71 y/o female s/p R hip replacement, anterior approach.    PT Comments    Pt is progressing well with therapeutic exercises and ambulated 225 ft in hallway with RW today.  She will need to complete stair training before going home.  Pt will benefit from continued skilled PT services to increase functional independence and safety.    Follow Up Recommendations  Home health PT     Equipment Recommendations  3in1 (PT)    Recommendations for Other Services       Precautions / Restrictions Precautions Precautions: Other (comment) Precaution Booklet Issued: No Precaution Comments: No hip precautions, pt with direct anteiror approach Restrictions Weight Bearing Restrictions: Yes RLE Weight Bearing: Weight bearing as tolerated    Mobility  Bed Mobility Overal bed mobility: Modified Independent             General bed mobility comments: Pt props herself up on her elbows while moving LEs to EOB before sitting upright.  No cue or physical assist needed.  Increased time.  Transfers Overall transfer level: Modified independent Equipment used: Rolling walker (2 wheeled)             General transfer comment: Pt demonstrates safe technique for sit<>stand without cues needed.  Pt is slower to stand.  Ambulation/Gait Ambulation/Gait assistance: Min guard Ambulation Distance (Feet): 225 Feet Assistive device: Rolling walker (2 wheeled) Gait Pattern/deviations: Step-through pattern;Decreased weight shift to right;Decreased stride length;Decreased stance time - right;Decreased step length - left;Trunk flexed Gait velocity: decreased Gait velocity interpretation: Below normal speed for age/gender General Gait Details: Cues for upright posture and forward gaze.  Pt with step through gait pattern without cues.  She does report  fatigue at end of ambulation.   Stairs            Wheelchair Mobility    Modified Rankin (Stroke Patients Only)       Balance Overall balance assessment: Needs assistance Sitting-balance support: No upper extremity supported;Feet supported Sitting balance-Leahy Scale: Good     Standing balance support: Bilateral upper extremity supported;During functional activity Standing balance-Leahy Scale: Poor Standing balance comment: Relies on UE support for static and dynamic activities                            Cognition Arousal/Alertness: Awake/alert Behavior During Therapy: WFL for tasks assessed/performed Overall Cognitive Status: Within Functional Limits for tasks assessed                                 General Comments: Pt continues to have some mild confusion      Exercises Total Joint Exercises Ankle Circles/Pumps: AROM;10 reps;Supine;Both Hip ABduction/ADduction: Strengthening;10 reps;Both;Supine Straight Leg Raises: 10 reps;AAROM;Strengthening;Right;Supine Long Arc Quad: Strengthening;10 reps;Seated;Right Knee Flexion: AROM;Right;10 reps;Standing;Other (comment) (for gentle stretch)    General Comments        Pertinent Vitals/Pain Pain Assessment: 0-10 Pain Score: 3  ("I always rate my pain lower than it is") Pain Location: R hip Pain Descriptors / Indicators: Aching;Guarding Pain Intervention(s): Limited activity within patient's tolerance;Monitored during session;Repositioned    Home Living                      Prior Function  PT Goals (current goals can now be found in the care plan section) Acute Rehab PT Goals Patient Stated Goal: Go home PT Goal Formulation: With patient Time For Goal Achievement: 01/24/17 Potential to Achieve Goals: Good Progress towards PT goals: Progressing toward goals    Frequency    BID      PT Plan Current plan remains appropriate    Co-evaluation              End of Session Equipment Utilized During Treatment: Gait belt Activity Tolerance: Patient tolerated treatment well Patient left: in chair;with call bell/phone within reach;with chair alarm set Nurse Communication: Mobility status PT Visit Diagnosis: Muscle weakness (generalized) (M62.81);Difficulty in walking, not elsewhere classified (R26.2)     Time: 4098-1191 PT Time Calculation (min) (ACUTE ONLY): 20 min  Charges:  $Gait Training: 8-22 mins                    G Codes:       Encarnacion Chu PT, DPT 01/11/2017, 11:17 AM

## 2017-01-11 NOTE — Progress Notes (Signed)
Patient is A&O x4. Up with assist x1. Using [facility] as needed. IV fluids still infusion at 50 cc/hr. Oral pain meds for pain, with good relief. Dressing to hip is CD&I. Bed alarm on for safety.

## 2017-01-11 NOTE — Anesthesia Postprocedure Evaluation (Signed)
Anesthesia Post Note  Patient: Jodi Parrish  Procedure(s) Performed: Procedure(s) (LRB): TOTAL HIP ARTHROPLASTY ANTERIOR APPROACH (Right)  Patient location during evaluation: Nursing Unit Anesthesia Type: Spinal Level of consciousness: awake, awake and alert and oriented Pain management: pain level controlled Vital Signs Assessment: post-procedure vital signs reviewed and stable Respiratory status: spontaneous breathing, nonlabored ventilation and respiratory function stable Cardiovascular status: blood pressure returned to baseline and stable Postop Assessment: no headache and no backache Anesthetic complications: no     Last Vitals:  Vitals:   01/10/17 2318 01/11/17 0424  BP: (!) 100/57 120/63  Pulse: 80 91  Resp: 18 18  Temp: 37 C 37.6 C    Last Pain:  Vitals:   01/11/17 0609  TempSrc:   PainSc: Asleep                 Ginger Carne

## 2017-01-12 LAB — BASIC METABOLIC PANEL
Anion gap: 5 (ref 5–15)
BUN: 10 mg/dL (ref 6–20)
CO2: 29 mmol/L (ref 22–32)
Calcium: 8.6 mg/dL — ABNORMAL LOW (ref 8.9–10.3)
Chloride: 100 mmol/L — ABNORMAL LOW (ref 101–111)
Creatinine, Ser: 0.67 mg/dL (ref 0.44–1.00)
GFR calc Af Amer: >60 mL/min (ref >60)
GFR calc non Af Amer: >60 mL/min (ref >60)
Glucose, Bld: 125 mg/dL — ABNORMAL HIGH (ref 65–99)
Potassium: 3.8 mmol/L (ref 3.5–5.1)
Sodium: 134 mmol/L — ABNORMAL LOW (ref 135–145)

## 2017-01-12 LAB — CBC
HCT: 28.8 % — ABNORMAL LOW (ref 35.0–47.0)
Hemoglobin: 10 g/dL — ABNORMAL LOW (ref 12.0–16.0)
MCH: 32.8 pg (ref 26.0–34.0)
MCHC: 34.8 g/dL (ref 32.0–36.0)
MCV: 94.4 fL (ref 80.0–100.0)
Platelets: 165 10*3/uL (ref 150–440)
RBC: 3.05 MIL/uL — ABNORMAL LOW (ref 3.80–5.20)
RDW: 13.2 % (ref 11.5–14.5)
WBC: 9.9 10*3/uL (ref 3.6–11.0)

## 2017-01-12 LAB — SURGICAL PATHOLOGY

## 2017-01-12 MED ORDER — ENOXAPARIN SODIUM 40 MG/0.4ML ~~LOC~~ SOLN: 40.0000 mg | SUBCUTANEOUS | 0 refills | Status: DC

## 2017-01-12 MED ORDER — DOCUSATE SODIUM 100 MG PO CAPS: 100.0000 mg | ORAL_CAPSULE | Freq: Two times a day (BID) | ORAL | 0 refills | Status: DC

## 2017-01-12 MED ORDER — OXYCODONE HCL 5 MG PO TABS: 5.0000 mg | ORAL_TABLET | ORAL | 0 refills | Status: DC | PRN

## 2017-01-12 NOTE — Discharge Summary (Signed)
Physician Discharge Summary  Patient ID: Jodi Parrish MRN: [id number] DOB/AGE: [birth year] 70 y.o.  Admit date: 01/10/2017 Discharge date: 01/12/2017  Admission Diagnoses:  PRIMARY OSTEOARTHRITIS OF RIGHT HIP   Discharge Diagnoses: Patient Active Problem List   Diagnosis Date Noted  . Primary osteoarthritis of right hip 01/10/2017    Past Medical History:  Diagnosis Date  . Ankle fracture    right  . Anxiety   . Arthritis   . Bipolar disorder (HCC)   . Depression   . Diverticulitis   . Hyperlipidemia   . Hypertension   . Osteoarthritis   . Overactive bladder      Transfusion: none   Consultants (if any):   Discharged Condition: Improved  Hospital Course: Jodi Parrish is an 71 y.o. female who was admitted 01/10/2017 with a diagnosis of right hip osteoarthritis and went to the operating room on 01/10/2017 and underwent the above named procedures.    Surgeries: Procedure(s): TOTAL HIP ARTHROPLASTY ANTERIOR APPROACH on 01/10/2017 Patient tolerated the surgery well. Taken to PACU where she was stabilized and then transferred to the orthopedic floor.  Started on Lovenox 40 q 24 hrs. Foot pumps applied bilaterally at 80 mm. Heels elevated on bed with rolled towels. No evidence of DVT. Negative Homan. Physical therapy started on day #1 for gait training and transfer. OT started day #1 for ADL and assisted devices.  Patient's foley and IV was d/c on day #1. Patient found to have mild Acute post op blood loss anemia. Hgb stable on post op day 2. On post op day #2 patient was stable and ready for discharge to home with HHPT.  Implants: Medacta AMIS 5 standard with Mpact 56 DM with liner and S 28 mm head  She was given perioperative antibiotics:  Anti-infectives    Start     Dose/Rate Route Frequency Ordered Stop   01/10/17 1330  ceFAZolin (ANCEF) 2 g in dextrose 5 % 100 mL IVPB     2 g 240 mL/hr over 30 Minutes Intravenous Every 6 hours 01/10/17 1144 01/11/17 0128   01/10/17 1145  ceFAZolin (ANCEF) IVPB 2g/100 mL premix  Status:  Discontinued     2 g 200 mL/hr over 30 Minutes Intravenous Every 6 hours 01/10/17 1133 01/10/17 1144   01/10/17 0605  ceFAZolin (ANCEF) 2-4 GM/100ML-% IVPB    Comments:  Eli Hose: cabinet override      01/10/17 0605 01/10/17 0737   01/10/17 0045  ceFAZolin (ANCEF) IVPB 2g/100 mL premix     2 g 200 mL/hr over 30 Minutes Intravenous  Once 01/10/17 0038 01/10/17 0807    .  She was given sequential compression devices, early ambulation, and Lovenox for DVT prophylaxis.  She benefited maximally from the hospital stay and there were no complications.    Recent vital signs:  Vitals:   01/11/17 2319 01/12/17 0730  BP: (!) 121/59 111/70  Pulse: 84 88  Resp: 18 18  Temp: 98.1 F (36.7 C) 98.9 F (37.2 C)    Recent laboratory studies:  Lab Results  Component Value Date   HGB 10.0 (L) 01/12/2017   HGB 10.7 (L) 01/11/2017   HGB 11.5 (L) 01/10/2017   Lab Results  Component Value Date   WBC 9.9 01/12/2017   PLT 165 01/12/2017   Lab Results  Component Value Date   INR 0.99 01/04/2017   Lab Results  Component Value Date   NA 134 (L) 01/12/2017   K 3.8 01/12/2017  CL 100 (L) 01/12/2017   CO2 29 01/12/2017   BUN 10 01/12/2017   CREATININE 0.67 01/12/2017   GLUCOSE 125 (H) 01/12/2017    Discharge Medications:   Allergies as of 01/12/2017      Reactions   Abilify [aripiprazole] Anxiety   SEVERE HYPERACTIVITY   Duloxetine Anxiety      Medication List    STOP taking these medications   HYDROcodone-acetaminophen 5-325 MG tablet Commonly known as:  NORCO/VICODIN     TAKE these medications   amphetamine-dextroamphetamine 20 MG tablet Commonly known as:  ADDERALL Take 10 mg by mouth daily.   CALCIUM 500 + D PO Take 1 tablet by mouth daily.   citalopram 20 MG tablet Commonly known as:  CELEXA Take 20 mg by mouth daily.   docusate sodium 100 MG capsule Commonly known as:  COLACE Take 1  capsule (100 mg total) by mouth 2 (two) times daily.   enoxaparin 40 MG/0.4ML injection Commonly known as:  LOVENOX Inject 0.4 mLs (40 mg total) into the skin daily.   estradiol 1 MG tablet Commonly known as:  ESTRACE Take 1 mg by mouth 2 (two) times a week.   ferrous sulfate 325 (65 FE) MG tablet Take 325 mg by mouth daily with breakfast.   Fish Oil 1000 MG Caps Take 1 capsule by mouth daily.   folic acid 800 MCG tablet Commonly known as:  FOLVITE Take 800 mcg by mouth daily.   lamoTRIgine 200 MG tablet Commonly known as:  LAMICTAL Take 200 mg by mouth at bedtime.   Magnesium 250 MG Tabs Take 1 tablet by mouth daily.   meloxicam 15 MG tablet Commonly known as:  MOBIC Take 15 mg by mouth daily.   multivitamin with minerals tablet Take 1 tablet by mouth daily.   oxyCODONE 5 MG immediate release tablet Commonly known as:  Oxy IR/ROXICODONE Take 1-2 tablets (5-10 mg total) by mouth every 3 (three) hours as needed for breakthrough pain.   pravastatin 40 MG tablet Commonly known as:  PRAVACHOL Take 40 mg by mouth at bedtime.   PROBIOTIC DAILY PO Take 1 capsule by mouth daily.   propranolol ER 60 MG 24 hr capsule Commonly known as:  INDERAL LA Take 60 mg by mouth daily.   solifenacin 5 MG tablet Commonly known as:  VESICARE Take 5 mg by mouth at bedtime.   spironolactone 100 MG tablet Commonly known as:  ALDACTONE Take 100 mg by mouth at bedtime.   temazepam 15 MG capsule Commonly known as:  RESTORIL Take 15 mg by mouth at bedtime.   vitamin B-12 1000 MCG tablet Commonly known as:  CYANOCOBALAMIN Take 1,000 mcg by mouth daily.   Vitamin D-3 5000 units Tabs Take 1 tablet by mouth daily.            Durable Medical Equipment        Start     Ordered   01/10/17 1134  DME Walker rolling  Once    Question:  Patient needs a walker to treat with the following condition  Answer:  Status post total hip replacement, right   01/10/17 1133   01/10/17 1134   DME 3 n 1  Once     01/10/17 1133   01/10/17 1134  DME Bedside commode  Once    Question:  Patient needs a bedside commode to treat with the following condition  Answer:  Status post total hip replacement, right   01/10/17 1133  Diagnostic Studies: Dg Hip Operative Unilat W Or W/o Pelvis Right  Result Date: 01/10/2017 CLINICAL DATA:  71 year old female undergoing right hip arthroplasty. EXAM: OPERATIVE RIGHT HIP (WITH PELVIS IF PERFORMED) 3 VIEWS TECHNIQUE: Fluoroscopic spot image(s) were submitted for interpretation post-operatively. COMPARISON:  None. FINDINGS: Three AP fluoroscopic spot images of the right hip. Bipolar type hip arthroplasty hardware in place on the second 2 images. Alignment in the AP direction appears normal. FLUOROSCOPY TIME:  0 minutes 12 seconds IMPRESSION: Right hip arthroplasty underway. Electronically Signed   By: Odessa Fleming M.D.   On: 01/10/2017 09:00   Dg Hip Unilat W Or W/o Pelvis 2-3 Views Right  Result Date: 01/10/2017 CLINICAL DATA:  Right total hip replacement EXAM: DG HIP (WITH OR WITHOUT PELVIS) 2-3V RIGHT COMPARISON:  None. FINDINGS: Changes of right total hip replacement. Normal alignment. No hardware or bony complicating feature. IMPRESSION: Right hip replacement.  No complicating feature. Electronically Signed   By: Charlett Nose M.D.   On: 01/10/2017 09:51    Disposition: Final discharge disposition not confirmed    Follow-up Information    MENZ,MICHAEL, MD Follow up in 2 week(s).   Specialty:  Orthopedic Surgery Contact information: [address] [place] [address] [phone]            Signed: Patience Musca 01/12/2017, 8:13 AM

## 2017-01-12 NOTE — Progress Notes (Signed)
Patient was discharged home with husband. IV removed with cath intact. Lovenox teaching reviewed with patient and her husband. Script with last dose of meds given. Allowed time for questions.

## 2017-01-12 NOTE — Progress Notes (Signed)
Physical Therapy Treatment Patient Details Name: Jodi Parrish MRN: [id number] DOB: 1946-04-08 Today's Date: 01/12/2017    History of Present Illness 71 y/o female s/p R hip replacement, anterior approach.    PT Comments    Pt continues to make progress with mobility and completed stair training this session.  She does require cues for safety and emphasized the importance of having her husband's assistance when ascending/descending step at home, pt verbalized understanding.  She ambulated 250 ft with RW with cues for improved gait mechanics.    Follow Up Recommendations  Home health PT     Equipment Recommendations  3in1 (PT)    Recommendations for Other Services       Precautions / Restrictions Precautions Precautions: Other (comment) Precaution Booklet Issued: No Precaution Comments: No hip precautions, pt with direct anteiror approach Restrictions Weight Bearing Restrictions: Yes RLE Weight Bearing: Weight bearing as tolerated    Mobility  Bed Mobility               General bed mobility comments: Pt sitting in chair upon PT arrival  Transfers Overall transfer level: Modified independent Equipment used: Rolling walker (2 wheeled)             General transfer comment: Pt demonstrates safe technique for sit<>stand without cues needed.  Pt is slower to stand.  Ambulation/Gait Ambulation/Gait assistance: Supervision Ambulation Distance (Feet): 250 Feet Assistive device: Rolling walker (2 wheeled) Gait Pattern/deviations: Step-through pattern;Decreased weight shift to right;Decreased stride length;Decreased stance time - right;Decreased step length - left;Trunk flexed Gait velocity: decreased Gait velocity interpretation: Below normal speed for age/gender General Gait Details: Pt self corrects posture without outside cues needed this session. Cues to not allow L foot to slide on floor but to improve foot clearance.  Cues and demonstration provided for greater  step length with heel strike Bil.  Pt reports fatigue at the end of ambulation.   Stairs Stairs: Yes   Stair Management: No rails;Backwards;With walker Number of Stairs: 1 (x3) General stair comments: Performed 1 step x3 with cues for technique and sequencing.  On second attempt pt performs before this PT holding RW and required education on safety.  Pt performs correctly again on third attempt.  Wheelchair Mobility    Modified Rankin (Stroke Patients Only)       Balance Overall balance assessment: Needs assistance Sitting-balance support: No upper extremity supported;Feet supported Sitting balance-Leahy Scale: Good     Standing balance support: Bilateral upper extremity supported;During functional activity Standing balance-Leahy Scale: Fair Standing balance comment: Pt able to stand statically without UE support but relies on RW for dynamic activities                            Cognition Arousal/Alertness: Awake/alert Behavior During Therapy: WFL for tasks assessed/performed Overall Cognitive Status: History of cognitive impairments - at baseline                                 General Comments: Pt continues to have some mild confusion.  Per nursing, this is pt's baseline.      Exercises Total Joint Exercises Ankle Circles/Pumps: AROM;10 reps;Both;Seated Long Arc Quad: Strengthening;10 reps;Seated;Right Marching in Standing: Seated;Both;10 reps    General Comments General comments (skin integrity, edema, etc.): Reviewed home layout safety as well as safe technique for car transfer, pt verbalized understanding.  Pertinent Vitals/Pain Pain Assessment: Faces Faces Pain Scale: Hurts little more Pain Location: R hip Pain Descriptors / Indicators: Aching;Guarding Pain Intervention(s): Limited activity within patient's tolerance;Monitored during session    Home Living                      Prior Function            PT Goals  (current goals can now be found in the care plan section) Acute Rehab PT Goals Patient Stated Goal: Go home PT Goal Formulation: With patient Time For Goal Achievement: 01/24/17 Potential to Achieve Goals: Good Progress towards PT goals: Progressing toward goals    Frequency    BID      PT Plan Current plan remains appropriate    Co-evaluation             End of Session Equipment Utilized During Treatment: Gait belt Activity Tolerance: Patient tolerated treatment well Patient left: with call bell/phone within reach;in chair;with chair alarm set Nurse Communication: Mobility status PT Visit Diagnosis: Muscle weakness (generalized) (M62.81);Difficulty in walking, not elsewhere classified (R26.2)     Time: 7829-5621 PT Time Calculation (min) (ACUTE ONLY): 23 min  Charges:  $Gait Training: 23-37 mins                    G Codes:       Encarnacion Chu PT, DPT 01/12/2017, 9:57 AM

## 2017-01-12 NOTE — Care Management Important Message (Signed)
Important Message  Patient Details  Name: Jodi Parrish MRN: [id number] Date of Birth: 02-11-1946   Medicare Important Message Given:  N/A - LOS <3 / Initial given by admissions    Marily Memos, RN 01/12/2017, 8:58 AM

## 2017-01-12 NOTE — Discharge Instructions (Signed)

## 2017-01-12 NOTE — Progress Notes (Addendum)
   Subjective: 2 Days Post-Op Procedure(s) (LRB): TOTAL HIP ARTHROPLASTY ANTERIOR APPROACH (Right) Patient reports pain as mild.   Patient is well, and has had no acute complaints or problems Denies any CP, SOB, ABD pain. We will continue therapy today.  Plan is to go Home after hospital stay.  Objective: Vital signs in last 24 hours: Temp:  [98.1 F (36.7 C)-99.5 F (37.5 C)] 98.9 F (37.2 C) (04/26 0730) Pulse Rate:  [84-103] 88 (04/26 0730) Resp:  [18] 18 (04/26 0730) BP: (106-122)/(59-70) 111/70 (04/26 0730) SpO2:  [98 %] 98 % (04/26 0730)  Intake/Output from previous day: 04/25 0701 - 04/26 0700 In: 480 [P.O.:480] Out: 300 [Urine:300] Intake/Output this shift: No intake/output data recorded.   Recent Labs  01/10/17 1202 01/11/17 0547 01/12/17 0541  HGB 11.5* 10.7* 10.0*    Recent Labs  01/11/17 0547 01/12/17 0541  WBC 8.5 9.9  RBC 3.22* 3.05*  HCT 30.9* 28.8*  PLT 165 165    Recent Labs  01/11/17 0547 01/12/17 0541  NA 129* 134*  K 3.9 3.8  CL 99* 100*  CO2 27 29  BUN 9 10  CREATININE 0.72 0.67  GLUCOSE 119* 125*  CALCIUM 8.5* 8.6*   No results for input(s): LABPT, INR in the last 72 hours.  EXAM General - Patient is Alert, Appropriate and Oriented Extremity - Neurovascular intact Sensation intact distally Intact pulses distally Dorsiflexion/Plantar flexion intact No cellulitis present Compartment soft Dressing - dressing C/D/I, no drainage and dressing changed Motor Function - intact, moving foot and toes well on exam.   Past Medical History:  Diagnosis Date  . Ankle fracture    right  . Anxiety   . Arthritis   . Bipolar disorder (HCC)   . Depression   . Diverticulitis   . Hyperlipidemia   . Hypertension   . Osteoarthritis   . Overactive bladder     Assessment/Plan:   2 Days Post-Op Procedure(s) (LRB): TOTAL HIP ARTHROPLASTY ANTERIOR APPROACH (Right) Active Problems:   Primary osteoarthritis of right hip   Acute post  op blood loss anemia   Estimated body mass index is 27.1 kg/m as calculated from the following:   Height as of this encounter:  (1.727 m).   Weight as of this encounter: 80.8 kg (178 lb 3.2 oz). Advance diet Up with therapy  Discharge home with HHPT today pending BM Follow up with [facility] in 2 weeks  DVT Prophylaxis - Lovenox, Foot Pumps and TED hose Weight-Bearing as tolerated to right leg   T. Cranston Neighbor, PA-C [facility] 01/12/2017, 8:09 AM

## 2017-01-12 NOTE — Care Management Note (Signed)
Case Management Note  Patient Details  Name: Jodi Parrish MRN: [id number] Date of Birth: [birth year]  Subjective/Objective:  Discharging  today                Action/Plan: [facility] notified of discharge. Cost of Lovenox is $ 22.88. Patient updated and denies issues paying for medication.   Expected Discharge Date:  01/12/17               Expected Discharge Plan:  Home w Home Health Services  In-House Referral:     Discharge planning Services  CM Consult  Post Acute Care Choice:  Home Health Choice offered to:  Patient  DME Arranged:    DME Agency:     HH Arranged:  PT HH Agency:  [facility] (now [facility])  Status of Service:  Completed, signed off  If discussed at [employer] of Stay Meetings, dates discussed:    Additional Comments:  Marily Memos, RN 01/12/2017, 8:52 AM

## 2017-01-25 ENCOUNTER — Other Ambulatory Visit: Payer: Self-pay | Admitting: Internal Medicine

## 2017-01-25 DIAGNOSIS — Z1231 Encounter for screening mammogram for malignant neoplasm of breast: Secondary | ICD-10-CM

## 2017-01-26 ENCOUNTER — Ambulatory Visit: Payer: Medicare Other

## 2017-02-07 ENCOUNTER — Ambulatory Visit
Admission: RE | Admit: 2017-02-07 | Discharge: 2017-02-07 | Disposition: A | Discharge status: Home / Self Care | Payer: Medicare Other | Source: Ambulatory Visit | Attending: Internal Medicine | Admitting: Internal Medicine

## 2017-02-07 DIAGNOSIS — Z1231 Encounter for screening mammogram for malignant neoplasm of breast: Secondary | ICD-10-CM | POA: Insufficient documentation

## 2017-02-14 ENCOUNTER — Inpatient Hospital Stay
Admission: RE | Admit: 2017-02-14 | Discharge: 2017-02-14 | Disposition: A | Discharge status: Home / Self Care | Payer: Self-pay | Source: Ambulatory Visit | Attending: *Deleted | Admitting: *Deleted

## 2017-02-14 ENCOUNTER — Other Ambulatory Visit: Payer: Self-pay | Admitting: *Deleted

## 2017-02-14 DIAGNOSIS — Z9289 Personal history of other medical treatment: Secondary | ICD-10-CM

## 2017-02-16 DIAGNOSIS — E7800 Pure hypercholesterolemia, unspecified: Secondary | ICD-10-CM | POA: Insufficient documentation

## 2017-04-06 ENCOUNTER — Other Ambulatory Visit
Admission: RE | Admit: 2017-04-06 | Discharge: 2017-04-06 | Disposition: A | Discharge status: Home / Self Care | Payer: Medicare Other | Source: Ambulatory Visit | Attending: Internal Medicine | Admitting: Internal Medicine

## 2017-04-06 DIAGNOSIS — E875 Hyperkalemia: Secondary | ICD-10-CM | POA: Diagnosis present

## 2017-04-06 LAB — AMMONIA: Ammonia: 12 umol/L (ref 9–35)

## 2017-08-28 ENCOUNTER — Ambulatory Visit: Payer: Self-pay | Admitting: Urology

## 2017-09-05 NOTE — Progress Notes (Signed)
09/06/2017 3:18 PM   Myles GipJane A de Ryke 10/10/1945 [id number]  Referring provider: Lauro RegulusAnderson, Marshall W, MD [address] [facility] [place] [id number]  Chief Complaint  Patient presents with  . Urinary Incontinence    HPI: Patient is a 71 -year-old Caucasian female who is referred to us by, Dr. Dareen PianoAnderson, for urinary incontinence.  Patient states that she has had urinary incontinence for two years.  She underwent a pelvic floor lift in 2009.    Patient has incontinence with stress.   She is experiencing 2 to 3 incontinent episodes during the day. She is experiencing 2 to 3 incontinent episodes during the night.  Her incontinence volume is large sometimes going through pads and clothes.  She is wearing two medium pads during the day and night time.    She does/does not have a history of urinary tract infections, STI's or injury to the bladder.   She is having associated urinary frequency, urgency and nocturia.  Her PVR is 24 mL.   She denies dysuria, gross hematuria, suprapubic pain, back pain, abdominal pain or flank pain.  She has not had any recent fevers, chills, nausea or vomiting.   She does not have a history of nephrolithiasis, GU surgery or GU trauma.   She is  sexually active.  Shehas not noted incontinence with sexual intercourse.    She is post menopausal.   She admits to constipation.  She sometimes has to DAD.    She has not had any recent imaging studies.    She is not drinking a lot of water daily.   She is drinking 2 to 3 caffeinated beverages daily.  She is not drinking alcoholic beverages daily.   One glass a week of soda.    Her risk factors for incontinence are obesity, vaginal delivery, a family history of incontinence, age, caffeine, depression, vaginal atrophy, oral estrogens and pelvic surgery.    She is taking diuretics, antidepressants, antipsychotics and/or oral estrogen.  She had been on Vesicare and Estrace cream in the past  with good results from her urologist[facility] [place] [place]    Reviewed referral notes.    PMH: Past Medical History:  Diagnosis Date  . Ankle fracture    right  . Anxiety   . Arthritis   . Bipolar disorder (HCC)   . Depression   . Diverticulitis   . Hyperlipidemia   . Hypertension   . Osteoarthritis   . Overactive bladder     Surgical History: Past Surgical History:  Procedure Laterality Date  . ABDOMINAL HYSTERECTOMY  1998  . BARTHOLIN GLAND CYST EXCISION  1976  . COLONOSCOPY    . ELBOW SURGERY Right 1995   crushed elbow with hardware implanted  . JOINT REPLACEMENT    . KNEE SURGERY Left 2000  . PELVIC FLOOR REPAIR  2009   PELVIC FLOOR LIFT  . REPLACEMENT TOTAL KNEE Left 2016  . TONSILLECTOMY  1958  . TOTAL HIP ARTHROPLASTY Left 2008  . TOTAL HIP ARTHROPLASTY Right 01/10/2017   Procedure: TOTAL HIP ARTHROPLASTY ANTERIOR APPROACH;  Surgeon: Kennedy BuckerMichael Menz, MD;  Location: [facility] ORS;  Service: Orthopedics;  Laterality: Right;    Home Medications:  Allergies as of 09/06/2017      Reactions   Abilify [aripiprazole] Anxiety   SEVERE HYPERACTIVITY   Duloxetine Anxiety      Medication List        Accurate as of 09/06/17  3:18 PM. Always use  your most recent med list.          amphetamine-dextroamphetamine 20 MG tablet Commonly known as:  ADDERALL Take 10 mg by mouth daily.   CALCIUM 500 + D PO Take 1 tablet by mouth daily.   citalopram 20 MG tablet Commonly known as:  CELEXA Take 20 mg by mouth daily.   docusate sodium 100 MG capsule Commonly known as:  COLACE Take 1 capsule (100 mg total) by mouth 2 (two) times daily.   enoxaparin 40 MG/0.4ML injection Commonly known as:  LOVENOX Inject 0.4 mLs (40 mg total) into the skin daily.   estradiol 0.1 MG/GM vaginal cream Commonly known as:  ESTRACE VAGINAL Apply 0.5mg  (pea-sized amount)  just inside the vaginal introitus with a finger-tip on Monday, Wednesday and Friday nights.   estradiol 1 MG  tablet Commonly known as:  ESTRACE Take 1 mg by mouth 2 (two) times a week.   ferrous sulfate 325 (65 FE) MG tablet Take 325 mg by mouth daily with breakfast.   Fish Oil 1000 MG Caps Take 1 capsule by mouth daily.   folic acid 800 MCG tablet Commonly known as:  FOLVITE Take 800 mcg by mouth daily.   lamoTRIgine 200 MG tablet Commonly known as:  LAMICTAL Take 200 mg by mouth at bedtime.   Magnesium 250 MG Tabs Take 1 tablet by mouth daily.   meloxicam 15 MG tablet Commonly known as:  MOBIC Take 15 mg by mouth daily.   multivitamin with minerals tablet Take 1 tablet by mouth daily.   neomycin-polymyxin-dexameth 0.1 % Oint Commonly known as:  MAXITROL 1 application.   pravastatin 40 MG tablet Commonly known as:  PRAVACHOL Take 40 mg by mouth at bedtime.   PROBIOTIC DAILY PO Take 1 capsule by mouth daily.   propranolol ER 60 MG 24 hr capsule Commonly known as:  INDERAL LA Take 60 mg by mouth daily.   solifenacin 5 MG tablet Commonly known as:  VESICARE Take 1 tablet (5 mg total) by mouth daily.   spironolactone 100 MG tablet Commonly known as:  ALDACTONE Take 100 mg by mouth at bedtime.   temazepam 15 MG capsule Commonly known as:  RESTORIL Take 15 mg by mouth at bedtime.   vitamin B-12 1000 MCG tablet Commonly known as:  CYANOCOBALAMIN Take 1,000 mcg by mouth daily.   Vitamin D-3 5000 units Tabs Take 1 tablet by mouth daily.       Allergies:  Allergies  Allergen Reactions  . Abilify [Aripiprazole] Anxiety    SEVERE HYPERACTIVITY  . Duloxetine Anxiety    Family History: Family History  Problem Relation Age of Onset  . Parkinson's disease Mother   . CAD Father   . Hypertension Father   . Breast cancer Neg Hx     Social History:  reports that she has quit smoking. Her smoking use included cigarettes. she has never used smokeless tobacco. She reports that she does not drink alcohol or use drugs.  ROS: UROLOGY Frequent Urination?:  Yes Hard to postpone urination?: Yes Burning/pain with urination?: No Get up at night to urinate?: Yes Leakage of urine?: Yes Urine stream starts and stops?: No Trouble starting stream?: No Do you have to strain to urinate?: No Blood in urine?: No Urinary tract infection?: No Sexually transmitted disease?: No Injury to kidneys or bladder?: No Painful intercourse?: No Weak stream?: No Currently pregnant?: No Vaginal bleeding?: No Last menstrual period?: n  Gastrointestinal Nausea?: No Vomiting?: No Indigestion/heartburn?: No Diarrhea?: No  Constipation?: Yes  Constitutional Fever: No Night sweats?: No Weight loss?: No Fatigue?: Yes  Skin Skin rash/lesions?: Yes Itching?: No  Eyes Blurred vision?: No Double vision?: No  Ears/Nose/Throat Sore throat?: No Sinus problems?: No  Hematologic/Lymphatic Swollen glands?: No Easy bruising?: No  Cardiovascular Leg swelling?: No Chest pain?: Yes  Respiratory Cough?: No Shortness of breath?: No  Endocrine Excessive thirst?: No  Musculoskeletal Back pain?: Yes Joint pain?: Yes  Neurological Headaches?: No Dizziness?: No  Psychologic Depression?: Yes Anxiety?: Yes  Physical Exam: BP (!) 147/90   Pulse 88   Ht 5\' 8"  (1.727 m)   Wt 194 lb 8 oz (88.2 kg)   BMI 29.57 kg/m   Constitutional: Well nourished. Alert and oriented, No acute distress. HEENT: [place] AT, moist mucus membranes. Trachea midline, no masses. Cardiovascular: No clubbing, cyanosis, or edema. Respiratory: Normal respiratory effort, no increased work of breathing. GI: Abdomen is soft, non tender, non distended, no abdominal masses. Liver and spleen not palpable.  No hernias appreciated.  Stool sample for occult testing is not indicated.   GU: No CVA tenderness.  No bladder fullness or masses.  Atrophic external genitalia, normal pubic hair distribution, no lesions.  Normal urethral meatus, no lesions, no prolapse, no discharge.   No urethral  masses, tenderness and/or tenderness. No bladder fullness, tenderness or masses. Pale vagina mucosa, poor estrogen effect, no discharge, no lesions, good pelvic support, no cystocele.  Rectocele noted.  Cervix and uterus are surgically absent.  No adnexal/parametria masses or tenderness noted.  Anus and perineum are without rashes or lesions.    Skin: No rashes, bruises or suspicious lesions. Lymph: No cervical or inguinal adenopathy. Neurologic: Grossly intact, no focal deficits, moving all 4 extremities. Psychiatric: Normal mood and affect.  Laboratory Data: Lab Results  Component Value Date   WBC 9.9 01/12/2017   HGB 10.0 (L) 01/12/2017   HCT 28.8 (L) 01/12/2017   MCV 94.4 01/12/2017   PLT 165 01/12/2017    Lab Results  Component Value Date   CREATININE 0.67 01/12/2017    Urinalysis    Component Value Date/Time   COLORURINE YELLOW (A) 01/04/2017 0915   APPEARANCEUR CLEAR (A) 01/04/2017 0915   APPEARANCEUR Clear 09/18/2013 1454   LABSPEC 1.005 01/04/2017 0915   LABSPEC 1.004 09/18/2013 1454   PHURINE 7.0 01/04/2017 0915   GLUCOSEU NEGATIVE 01/04/2017 0915   GLUCOSEU Negative 09/18/2013 1454   HGBUR NEGATIVE 01/04/2017 0915   BILIRUBINUR NEGATIVE 01/04/2017 0915   BILIRUBINUR Negative 09/18/2013 1454   KETONESUR NEGATIVE 01/04/2017 0915   PROTEINUR NEGATIVE 01/04/2017 0915   NITRITE NEGATIVE 01/04/2017 0915   LEUKOCYTESUR NEGATIVE 01/04/2017 0915   LEUKOCYTESUR Negative 09/18/2013 1454    I have reviewed the labs.   Pertinent Imaging: Results for DE ALESSA, MAZUR (MRN [id number]) as of 09/06/2017 15:16  Ref. Range 09/06/2017 15:12  Scan Result Unknown 24ml   I have independently reviewed the films.    Assessment & Plan:    1. Urge incontinence  - patient has had great success with Vesicare 5 mg daily in the past and would like to restart that medication  - prescription for the Vesicare is sent to her pharmacy  - RTC in 3 months for PVR and symptom recheck    2. Vaginal atrophy  - restart the the Estrace cream three nights weekly  - RTC in 3 months for exam  Return in about 3 months (around 12/05/2017) for OAB questionnaire, PVR and exam.  These notes generated with  voice recognition software. I apologize for typographical errors.  Zara Council, [facility] [address], [place], [place] [address] [phone]

## 2017-09-06 ENCOUNTER — Encounter: Payer: Self-pay | Admitting: Urology

## 2017-09-06 ENCOUNTER — Other Ambulatory Visit: Payer: Self-pay

## 2017-09-06 ENCOUNTER — Ambulatory Visit (INDEPENDENT_AMBULATORY_CARE_PROVIDER_SITE_OTHER): Payer: Medicare Other | Admitting: Urology

## 2017-09-06 VITALS — BP 147/90 | HR 88 | Ht 68.0 in | Wt 194.5 lb

## 2017-09-06 DIAGNOSIS — N952 Postmenopausal atrophic vaginitis: Secondary | ICD-10-CM

## 2017-09-06 DIAGNOSIS — N3946 Mixed incontinence: Secondary | ICD-10-CM | POA: Diagnosis not present

## 2017-09-06 LAB — BLADDER SCAN AMB NON-IMAGING

## 2017-09-06 MED ORDER — SOLIFENACIN SUCCINATE 5 MG PO TABS: 5.0000 mg | ORAL_TABLET | Freq: Every day | ORAL | 3 refills | Status: DC

## 2017-09-06 MED ORDER — ESTRADIOL 0.1 MG/GM VA CREA: TOPICAL_CREAM | VAGINAL | 12 refills | Status: DC

## 2017-12-05 NOTE — Progress Notes (Signed)
**Note Jodi-Identified via Obfuscation** 12/06/2017 2:09 PM   Jodi Parrish 05/06/1946 [id number]  Referring provider: Lauro Regulus, MD [address] [facility] [place] [facility] [place], [place] [address]  No chief complaint on file.   HPI: Patient is a 72 year old Caucasian female with mixed urinary incontinence and vaginal atrophy who presents today for 32-month follow-up.  Background history Patient is a 51 -year-old Caucasian female who is referred to [place] by, Dr. Dareen Piano, for urinary incontinence.  Patient states that she has had urinary incontinence for two years.  She underwent a pelvic floor lift in 2009.  Patient has incontinence with stress.   She is experiencing 2 to 3 incontinent episodes during the day. She is experiencing 2 to 3 incontinent episodes during the night.  Her incontinence volume is large sometimes going through pads and clothes.  She is wearing two medium pads during the day and night time.  She does not have a history of urinary tract infections, STI's or injury to the bladder.  She is having associated urinary frequency, urgency and nocturia.  Her PVR is 24 mL.   She denies dysuria, gross hematuria, suprapubic pain, back pain, abdominal pain or flank pain.  She has not had any recent fevers, chills, nausea or vomiting.   She does not have a history of nephrolithiasis, GU surgery or GU trauma.  She is  sexually active.  She has not noted incontinence with sexual intercourse.  She is post menopausal.   She admits to constipation.  She sometimes has to DAD.  She has not had any recent imaging studies.  She is not drinking a lot of water daily.   She is drinking 2 to 3 caffeinated beverages daily.  She is not drinking alcoholic beverages daily.   One glass a week of soda.  Her risk factors for incontinence are obesity, vaginal delivery, a family history of incontinence, age, caffeine, depression, vaginal atrophy, oral estrogens and pelvic surgery.  She is taking diuretics, antidepressants, antipsychotics  and/or oral estrogen.  She had been on Vesicare and Estrace cream in the past with good results from her urologist, Dr. Gwyneth Sprout in [facility].    Today, the patient has been experiencing urgency x 0-3, frequency x 4-7, not restricting fluids to avoid visits to the restroom, not engaging in toilet mapping, incontinence x 0-3 and nocturia x 0-3.   Her PVR is 118 mL.  Patient denies any gross hematuria, dysuria or suprapubic/flank pain.  Patient denies any fevers, chills, nausea or vomiting.    She has noticed improvement in her frequency and urgency, she states she seldom has leakage of urine and she has nocturia x1    She has been using the vaginal estrogen cream three nights weekly.      PMH: Past Medical History:  Diagnosis Date  . Ankle fracture    right  . Anxiety   . Arthritis   . Bipolar disorder (HCC)   . Depression   . Diverticulitis   . Hyperlipidemia   . Hypertension   . Osteoarthritis   . Overactive bladder     Surgical History: Past Surgical History:  Procedure Laterality Date  . ABDOMINAL HYSTERECTOMY  1998  . BARTHOLIN GLAND CYST EXCISION  1976  . COLONOSCOPY    . ELBOW SURGERY Right 1995   crushed elbow with hardware implanted  . JOINT REPLACEMENT    . KNEE SURGERY Left 2000  . PELVIC FLOOR REPAIR  2009   PELVIC FLOOR LIFT  . REPLACEMENT  TOTAL KNEE Left 2016  . TONSILLECTOMY  1958  . TOTAL HIP ARTHROPLASTY Left 2008  . TOTAL HIP ARTHROPLASTY Right 01/10/2017   Procedure: TOTAL HIP ARTHROPLASTY ANTERIOR APPROACH;  Surgeon: Kennedy BuckerMichael Menz, MD;  Location: [facility] ORS;  Service: Orthopedics;  Laterality: Right;    Home Medications:  Allergies as of 12/06/2017      Reactions   Abilify [aripiprazole] Anxiety   SEVERE HYPERACTIVITY   Duloxetine Anxiety      Medication List        Accurate as of 12/06/17  2:09 PM. Always use your most recent med list.          amphetamine-dextroamphetamine 20 MG tablet Commonly known as:  ADDERALL Take 10 mg by mouth daily.     CALCIUM 500 + D PO Take 1 tablet by mouth daily.   citalopram 20 MG tablet Commonly known as:  CELEXA Take 20 mg by mouth daily.   docusate sodium 100 MG capsule Commonly known as:  COLACE Take 1 capsule (100 mg total) by mouth 2 (two) times daily.   enoxaparin 40 MG/0.4ML injection Commonly known as:  LOVENOX Inject 0.4 mLs (40 mg total) into the skin daily.   estradiol 0.1 MG/GM vaginal cream Commonly known as:  ESTRACE VAGINAL Apply 0.5mg  (pea-sized amount)  just inside the vaginal introitus with a finger-tip on Monday, Wednesday and Friday nights.   estradiol 0.1 MG/GM vaginal cream Commonly known as:  ESTRACE VAGINAL Apply 0.5mg  (pea-sized amount)  just inside the vaginal introitus with a finger-tip on then Monday, Wednesday and Friday nights.   estradiol 1 MG tablet Commonly known as:  ESTRACE Take 1 mg by mouth 2 (two) times a week.   ferrous sulfate 325 (65 FE) MG tablet Take 325 mg by mouth daily with breakfast.   Fish Oil 1000 MG Caps Take 1 capsule by mouth daily.   folic acid 800 MCG tablet Commonly known as:  FOLVITE Take 800 mcg by mouth daily.   lamoTRIgine 200 MG tablet Commonly known as:  LAMICTAL Take 200 mg by mouth at bedtime.   Magnesium 250 MG Tabs Take 1 tablet by mouth daily.   meloxicam 15 MG tablet Commonly known as:  MOBIC Take 15 mg by mouth daily.   multivitamin with minerals tablet Take 1 tablet by mouth daily.   neomycin-polymyxin-dexameth 0.1 % Oint Commonly known as:  MAXITROL 1 application.   pravastatin 40 MG tablet Commonly known as:  PRAVACHOL Take 40 mg by mouth at bedtime.   PROBIOTIC DAILY PO Take 1 capsule by mouth daily.   propranolol ER 60 MG 24 hr capsule Commonly known as:  INDERAL LA Take 60 mg by mouth daily.   solifenacin 5 MG tablet Commonly known as:  VESICARE Take 1 tablet (5 mg total) by mouth daily.   spironolactone 100 MG tablet Commonly known as:  ALDACTONE Take 100 mg by mouth at  bedtime.   temazepam 15 MG capsule Commonly known as:  RESTORIL Take 15 mg by mouth at bedtime.   vitamin B-12 1000 MCG tablet Commonly known as:  CYANOCOBALAMIN Take 1,000 mcg by mouth daily.   Vitamin D-3 5000 units Tabs Take 1 tablet by mouth daily.       Allergies:  Allergies  Allergen Reactions  . Abilify [Aripiprazole] Anxiety    SEVERE HYPERACTIVITY  . Duloxetine Anxiety    Family History: Family History  Problem Relation Age of Onset  . Parkinson's disease Mother   . CAD Father   .  Hypertension Father   . Breast cancer Neg Hx     Social History:  reports that she has quit smoking. Her smoking use included cigarettes. she has never used smokeless tobacco. She reports that she does not drink alcohol or use drugs.  ROS: UROLOGY Frequent Urination?: No Hard to postpone urination?: No Burning/pain with urination?: No Get up at night to urinate?: Yes Leakage of urine?: Yes Urine stream starts and stops?: No Trouble starting stream?: No Do you have to strain to urinate?: No Blood in urine?: No Urinary tract infection?: No Sexually transmitted disease?: No Injury to kidneys or bladder?: No Painful intercourse?: No Weak stream?: No Currently pregnant?: No Vaginal bleeding?: No Last menstrual period?: n  Gastrointestinal Nausea?: No Vomiting?: No Indigestion/heartburn?: No Diarrhea?: No Constipation?: Yes  Constitutional Fever: No Night sweats?: No Weight loss?: No Fatigue?: No  Skin Skin rash/lesions?: No Itching?: No  Eyes Blurred vision?: No Double vision?: No  Ears/Nose/Throat Sore throat?: No Sinus problems?: No  Hematologic/Lymphatic Swollen glands?: No Easy bruising?: No  Cardiovascular Leg swelling?: No Chest pain?: No  Respiratory Cough?: No Shortness of breath?: No  Endocrine Excessive thirst?: No  Musculoskeletal Back pain?: No Joint pain?: Yes  Neurological Headaches?: No Dizziness?:  No  Psychologic Depression?: Yes Anxiety?: Yes  Physical Exam: BP (!) 164/108   Pulse 89   Resp 16   Ht 5\' 8"  (1.727 m)   Wt 199 lb 6.4 oz (90.4 kg)   SpO2 98%   BMI 30.32 kg/m   Constitutional: Well nourished. Alert and oriented, No acute distress. HEENT: [place] AT, moist mucus membranes. Trachea midline, no masses. Cardiovascular: No clubbing, cyanosis, or edema. Respiratory: Normal respiratory effort, no increased work of breathing. GI: Abdomen is soft, non tender, non distended, no abdominal masses. Liver and spleen not palpable.  No hernias appreciated.  Stool sample for occult testing is not indicated.   GU: No CVA tenderness.  No bladder fullness or masses.  Atrophic external genitalia, normal pubic hair distribution, there are 5 flat flesh colored lesions on the labia.  Normal urethral meatus, no lesions, no prolapse, no discharge.   No urethral masses, tenderness and/or tenderness. No bladder fullness, tenderness or masses. Pale vagina mucosa, poor estrogen effect, no discharge, no lesions, good pelvic support, no cystocele.  Rectocele noted.  Cervix and uterus are surgically absent.  No adnexal/parametria masses or tenderness noted.  Anus and perineum are without rashes or lesions.    Skin: No rashes, bruises or suspicious lesions. Lymph: No cervical or inguinal adenopathy. Neurologic: Grossly intact, no focal deficits, moving all 4 extremities. Psychiatric: Normal mood and affect.    Laboratory Data: Lab Results  Component Value Date   WBC 9.9 01/12/2017   HGB 10.0 (L) 01/12/2017   HCT 28.8 (L) 01/12/2017   MCV 94.4 01/12/2017   PLT 165 01/12/2017    Lab Results  Component Value Date   CREATININE 0.67 01/12/2017    Urinalysis    Component Value Date/Time   COLORURINE YELLOW (A) 01/04/2017 0915   APPEARANCEUR CLEAR (A) 01/04/2017 0915   APPEARANCEUR Clear 09/18/2013 1454   LABSPEC 1.005 01/04/2017 0915   LABSPEC 1.004 09/18/2013 1454   PHURINE 7.0 01/04/2017  0915   GLUCOSEU NEGATIVE 01/04/2017 0915   GLUCOSEU Negative 09/18/2013 1454   HGBUR NEGATIVE 01/04/2017 0915   BILIRUBINUR NEGATIVE 01/04/2017 0915   BILIRUBINUR Negative 09/18/2013 1454   KETONESUR NEGATIVE 01/04/2017 0915   PROTEINUR NEGATIVE 01/04/2017 0915   NITRITE NEGATIVE 01/04/2017 0915  LEUKOCYTESUR NEGATIVE 01/04/2017 0915   LEUKOCYTESUR Negative 09/18/2013 1454    I have reviewed the labs.   Pertinent Imaging: Results for Jodi Parrish, Jodi Parrish (MRN [id number]) as of 12/28/2017 09:13  Ref. Range 12/06/2017 13:50  Scan Result Unknown    Assessment & Plan:    1. Vaginal lesions Refer to gynecology  2. Urge incontinence Patient found the Vesicare 5 mg daily very helpful and controlling her symptoms RTC in one year for OAB questionnaire, PVR and exam  3. Vaginal atrophy Continue the Estrace cream three nights weekly RTC in 12 months for exam  Return in about 1 year (around 12/07/2018) for OAB questionnaire, PVR and exam.  These notes generated with voice recognition software. I apologize for typographical errors.  Michiel Cowboy, PA-C  [facility] [address] [place], [place] [address] [phone]

## 2017-12-06 ENCOUNTER — Ambulatory Visit (INDEPENDENT_AMBULATORY_CARE_PROVIDER_SITE_OTHER): Payer: Medicare Other | Admitting: Urology

## 2017-12-06 ENCOUNTER — Encounter: Payer: Self-pay | Admitting: Urology

## 2017-12-06 VITALS — BP 164/108 | HR 89 | Resp 16 | Ht 68.0 in | Wt 199.4 lb

## 2017-12-06 DIAGNOSIS — N3946 Mixed incontinence: Secondary | ICD-10-CM | POA: Diagnosis not present

## 2017-12-06 DIAGNOSIS — N9089 Other specified noninflammatory disorders of vulva and perineum: Secondary | ICD-10-CM | POA: Diagnosis not present

## 2017-12-06 DIAGNOSIS — N952 Postmenopausal atrophic vaginitis: Secondary | ICD-10-CM | POA: Diagnosis not present

## 2017-12-06 LAB — BLADDER SCAN AMB NON-IMAGING

## 2017-12-06 MED ORDER — SOLIFENACIN SUCCINATE 5 MG PO TABS: 5.0000 mg | ORAL_TABLET | Freq: Every day | ORAL | 3 refills | Status: DC

## 2017-12-06 MED ORDER — ESTRADIOL 0.1 MG/GM VA CREA: TOPICAL_CREAM | VAGINAL | 12 refills | Status: DC

## 2017-12-07 ENCOUNTER — Telehealth: Payer: Self-pay | Admitting: Obstetrics & Gynecology

## 2017-12-07 NOTE — Telephone Encounter (Signed)
Jodi Parrish referring for Labial lesion. Called and left voicemail for patient to call back to be schedule

## 2017-12-11 ENCOUNTER — Encounter: Payer: Self-pay | Admitting: Obstetrics and Gynecology

## 2017-12-12 ENCOUNTER — Encounter: Payer: Self-pay | Admitting: Obstetrics and Gynecology

## 2017-12-14 ENCOUNTER — Encounter: Payer: Self-pay | Admitting: Obstetrics and Gynecology

## 2017-12-14 ENCOUNTER — Ambulatory Visit (INDEPENDENT_AMBULATORY_CARE_PROVIDER_SITE_OTHER): Payer: Medicare Other | Admitting: Obstetrics and Gynecology

## 2017-12-14 VITALS — BP 140/80 | HR 83 | Ht 68.0 in | Wt 202.0 lb

## 2017-12-14 DIAGNOSIS — N898 Other specified noninflammatory disorders of vagina: Secondary | ICD-10-CM | POA: Diagnosis not present

## 2017-12-14 LAB — POCT WET PREP WITH KOH
Clue Cells Wet Prep HPF POC: NEGATIVE
KOH Prep POC: NEGATIVE
Trichomonas, UA: NEGATIVE
Yeast Wet Prep HPF POC: NEGATIVE

## 2017-12-14 MED ORDER — CLOTRIMAZOLE-BETAMETHASONE 1-0.05 % EX CREA: 1.0000 "application " | TOPICAL_CREAM | Freq: Two times a day (BID) | CUTANEOUS | 0 refills | Status: DC

## 2017-12-14 NOTE — Progress Notes (Signed)
Lauro Regulus, MD   Chief Complaint  Patient presents with  . vaginal lesion    HPI:      Ms. Jodi Parrish is a 72 y.o. [phone] who LMP was No LMP recorded. Patient has had a hysterectomy., presents today for NP referral for vaginal lesion from [facility]. Pt has had ext vaginal itching bilat for several months. No meds to treat. No increased vag d/c, odor, irritation at introitus. Has been treating with vag ERT without sx change. She uses scented soap, no dryer sheets. Has to wear pantyliners for urine leakage. Pt denies any PMB. She is sex active, no vag dryness.    Past Medical History:  Diagnosis Date  . Ankle fracture    right  . Anxiety   . Arthritis   . Bipolar disorder (HCC)   . Depression   . Diverticulitis   . Hyperlipidemia   . Hypertension   . Osteoarthritis   . Overactive bladder     Past Surgical History:  Procedure Laterality Date  . ABDOMINAL HYSTERECTOMY  1998  . BARTHOLIN GLAND CYST EXCISION  1976  . COLONOSCOPY    . ELBOW SURGERY Right 1995   crushed elbow with hardware implanted  . JOINT REPLACEMENT    . KNEE SURGERY Left 2000  . PELVIC FLOOR REPAIR  2009   PELVIC FLOOR LIFT  . REPLACEMENT TOTAL KNEE Left 2016  . TONSILLECTOMY  1958  . TOTAL HIP ARTHROPLASTY Left 2008  . TOTAL HIP ARTHROPLASTY Right 01/10/2017   Procedure: TOTAL HIP ARTHROPLASTY ANTERIOR APPROACH;  Surgeon: Kennedy Bucker, MD;  Location: [facility] ORS;  Service: Orthopedics;  Laterality: Right;    Family History  Problem Relation Age of Onset  . Parkinson's disease Mother   . CAD Father   . Hypertension Father   . Breast cancer Neg Hx     Social History   Socioeconomic History  . Marital status: Married    Spouse name: Not on file  . Number of children: Not on file  . Years of education: Not on file  . Highest education level: Not on file  Occupational History  . Not on file  Social Needs  . Financial resource strain: Not on file  . Food insecurity:   Worry: Not on file    Inability: Not on file  . Transportation needs:    Medical: Not on file    Non-medical: Not on file  Tobacco Use  . Smoking status: Former Smoker    Types: Cigarettes  . Smokeless tobacco: Never Used  Substance and Sexual Activity  . Alcohol use: No  . Drug use: No  . Sexual activity: Yes  Lifestyle  . Physical activity:    Days per week: Not on file    Minutes per session: Not on file  . Stress: Not on file  Relationships  . Social connections:    Talks on phone: Not on file    Gets together: Not on file    Attends religious service: Not on file    Active member of club or organization: Not on file    Attends meetings of clubs or organizations: Not on file    Relationship status: Not on file  . Intimate partner violence:    Fear of current or ex partner: Not on file    Emotionally abused: Not on file    Physically abused: Not on file    Forced sexual activity: Not on file  Other Topics Concern  .  Not on file  Social History Narrative  . Not on file    Outpatient Medications Prior to Visit  Medication Sig Dispense Refill  . amphetamine-dextroamphetamine (ADDERALL) 20 MG tablet Take 10 mg by mouth daily.     . Calcium Carbonate-Vitamin D (CALCIUM 500 + D PO) Take 1 tablet by mouth daily.    . Cholecalciferol (VITAMIN D-3) 5000 units TABS Take 1 tablet by mouth daily.    . citalopram (CELEXA) 20 MG tablet Take 20 mg by mouth daily.    Marland Kitchen docusate sodium (COLACE) 100 MG capsule Take 1 capsule (100 mg total) by mouth 2 (two) times daily. 10 capsule 0  . estradiol (ESTRACE VAGINAL) 0.1 MG/GM vaginal cream Apply 0.5mg  (pea-sized amount)  just inside the vaginal introitus with a finger-tip on Monday, Wednesday and Friday nights. 30 g 12  . estradiol (ESTRACE VAGINAL) 0.1 MG/GM vaginal cream Apply 0.5mg  (pea-sized amount)  just inside the vaginal introitus with a finger-tip on then Monday, Wednesday and Friday nights. 30 g 12  . estradiol (ESTRACE) 1 MG  tablet Take 1 mg by mouth 2 (two) times a week.    . ferrous sulfate 325 (65 FE) MG tablet Take 325 mg by mouth daily with breakfast.    . folic acid (FOLVITE) 800 MCG tablet Take 800 mcg by mouth daily.    Marland Kitchen lamoTRIgine (LAMICTAL) 200 MG tablet Take 200 mg by mouth at bedtime.    . Magnesium 250 MG TABS Take 1 tablet by mouth daily.    . meloxicam (MOBIC) 15 MG tablet Take 15 mg by mouth daily.    . Multiple Vitamins-Minerals (MULTIVITAMIN WITH MINERALS) tablet Take 1 tablet by mouth daily.    Marland Kitchen neomycin-polymyxin-dexameth (MAXITROL) 0.1 % OINT 1 application.    . Omega-3 Fatty Acids (FISH OIL) 1000 MG CAPS Take 1 capsule by mouth daily.    . pravastatin (PRAVACHOL) 40 MG tablet Take 40 mg by mouth at bedtime.    . Probiotic Product (PROBIOTIC DAILY PO) Take 1 capsule by mouth daily.    . propranolol ER (INDERAL LA) 60 MG 24 hr capsule Take 60 mg by mouth daily.    . solifenacin (VESICARE) 5 MG tablet Take 1 tablet (5 mg total) by mouth daily. 90 tablet 3  . spironolactone (ALDACTONE) 100 MG tablet Take 100 mg by mouth at bedtime.    . temazepam (RESTORIL) 15 MG capsule Take 15 mg by mouth at bedtime.    . vitamin B-12 (CYANOCOBALAMIN) 1000 MCG tablet Take 1,000 mcg by mouth daily.    Marland Kitchen enoxaparin (LOVENOX) 40 MG/0.4ML injection Inject 0.4 mLs (40 mg total) into the skin daily. 14 Syringe 0   No facility-administered medications prior to visit.       ROS:  Review of Systems  Constitutional: Negative for fever.  Gastrointestinal: Positive for constipation. Negative for blood in stool, diarrhea, nausea and vomiting.  Genitourinary: Negative for dyspareunia, dysuria, flank pain, frequency, hematuria, urgency, vaginal bleeding, vaginal discharge and vaginal pain.  Musculoskeletal: Negative for back pain.  Skin: Negative for rash.   BREAST: No symptoms   OBJECTIVE:   Vitals:  BP 140/80   Pulse 83   Ht 5\' 8"  (1.727 m)   Wt 202 lb (91.6 kg)   BMI 30.71 kg/m   Physical Exam    Constitutional: She is oriented to person, place, and time and well-developed, well-nourished, and in no distress. Vital signs are normal.  Genitourinary: Uterus normal, cervix normal, right adnexa normal and left  adnexa normal. Uterus is not enlarged. Cervix exhibits no motion tenderness and no tenderness. Right adnexum displays no mass and no tenderness. Left adnexum displays no mass and no tenderness. Vulva exhibits erythema. Vulva exhibits no exudate, no lesion, no rash and no tenderness. Vagina exhibits no lesion. Thick  white and vaginal discharge found.  Genitourinary Comments: BILAT LABIA MAJORA/MINORA JUNCTION WITH ERYTHEMA, PINPOINT EXCORIATIONS; NO ULCERS, HYPERTROPHY, THINNING  Musculoskeletal: Normal range of motion.  Neurological: She is alert and oriented to person, place, and time.  Psychiatric: Memory, affect and judgment normal.  Vitals reviewed.   Results: Results for orders placed or performed in visit on 12/14/17 (from the past 24 hour(s))  POCT Wet Prep with KOH     Status: Normal   Collection Time: 12/14/17  4:34 PM  Result Value Ref Range   Trichomonas, UA Negative    Clue Cells Wet Prep HPF POC NEG    Epithelial Wet Prep HPF POC  Few, Moderate, Many, Too numerous to count   Yeast Wet Prep HPF POC neg    Bacteria Wet Prep HPF POC  Few   RBC Wet Prep HPF POC     WBC Wet Prep HPF POC     KOH Prep POC Negative Negative     Assessment/Plan: Vaginal itching - Neg wet prep/pos ext exam. Question fungal vs chem. Rx lotrisone crm BID for 2 wks. RTO in 2 wks if still sx. If resolved, can cx appt. Dove sens skin soap.  - Plan: POCT Wet Prep with KOH, clotrimazole-betamethasone (LOTRISONE) cream    Meds ordered this encounter  Medications  . clotrimazole-betamethasone (LOTRISONE) cream    Sig: Apply 1 application topically 2 (two) times daily. For 2 wks    Dispense:  15 g    Refill:  0    Order Specific Question:   Supervising Provider    Answer:   Nadara MustardHARRIS, ROBERT P  [[id number]]      Return in about 2 weeks (around 12/28/2017) for vaginitis f/u.  Alicia B. Copland, PA-C 12/14/2017 4:35 PM

## 2017-12-14 NOTE — Patient Instructions (Signed)
I value your feedback and entrusting us with your care. If you get a [place] patient survey, I would appreciate you taking the time to let us know about your experience today. Thank you! 

## 2017-12-18 ENCOUNTER — Other Ambulatory Visit: Payer: Self-pay | Admitting: Orthopedic Surgery

## 2017-12-18 DIAGNOSIS — T8484XD Pain due to internal orthopedic prosthetic devices, implants and grafts, subsequent encounter: Secondary | ICD-10-CM

## 2017-12-18 DIAGNOSIS — Z96652 Presence of left artificial knee joint: Principal | ICD-10-CM

## 2017-12-28 ENCOUNTER — Ambulatory Visit: Payer: PRIVATE HEALTH INSURANCE | Admitting: Obstetrics and Gynecology

## 2018-01-01 ENCOUNTER — Other Ambulatory Visit: Payer: Self-pay | Admitting: Orthopedic Surgery

## 2018-01-01 ENCOUNTER — Encounter
Admission: RE | Admit: 2018-01-01 | Discharge: 2018-01-01 | Disposition: A | Discharge status: Home / Self Care | Payer: Medicare Other | Source: Ambulatory Visit | Attending: Orthopedic Surgery | Admitting: Orthopedic Surgery

## 2018-01-01 DIAGNOSIS — M9943 Connective tissue stenosis of neural canal of lumbar region: Secondary | ICD-10-CM

## 2018-01-18 ENCOUNTER — Ambulatory Visit: Payer: Medicare Other

## 2018-02-09 IMAGING — XA DG HIP (WITH PELVIS) OPERATIVE*R*
3 series · 6 of 6 positions shown · non-contrast
Comparison: None.

CLINICAL DATA: 71-year-old female undergoing right hip
arthroplasty.

EXAM:
OPERATIVE RIGHT HIP (WITH PELVIS IF PERFORMED) 3 VIEWS
TECHNIQUE: Fluoroscopic spot image(s) were submitted for interpretation
post-operatively.

[Series 1: ortho standard · 1 of 1 slices shown (1 of 3)]
[im 1/1]
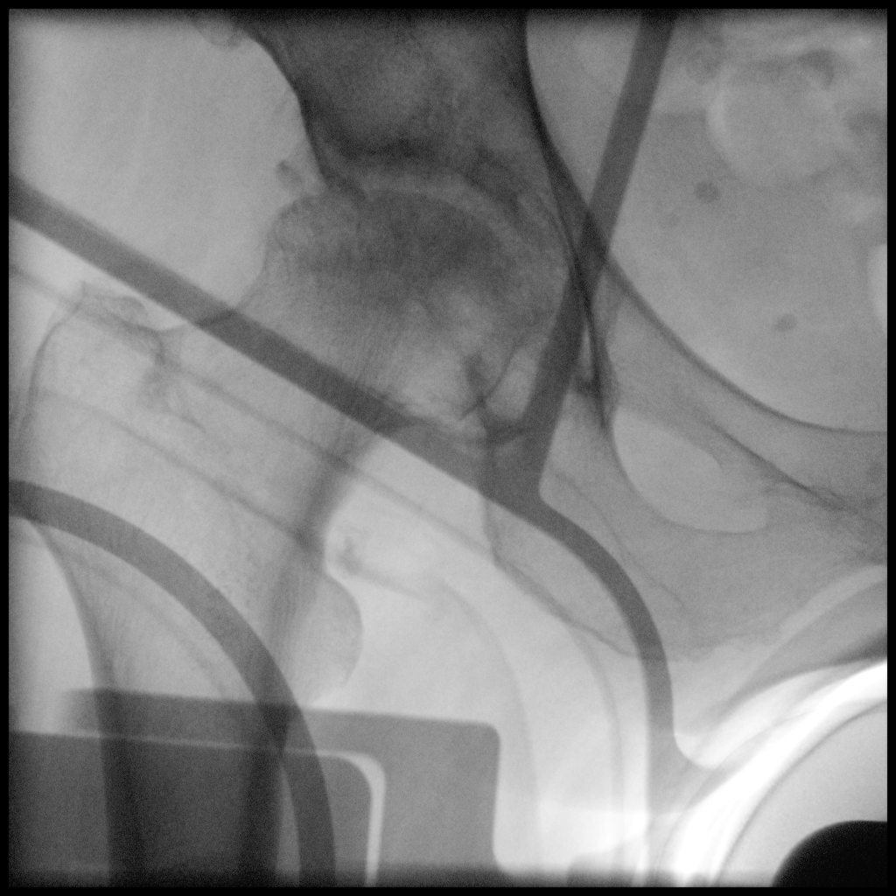

[Series 6: ortho standard · 4 of 8 frames shown (2 of 3)]
[frame 1/8]
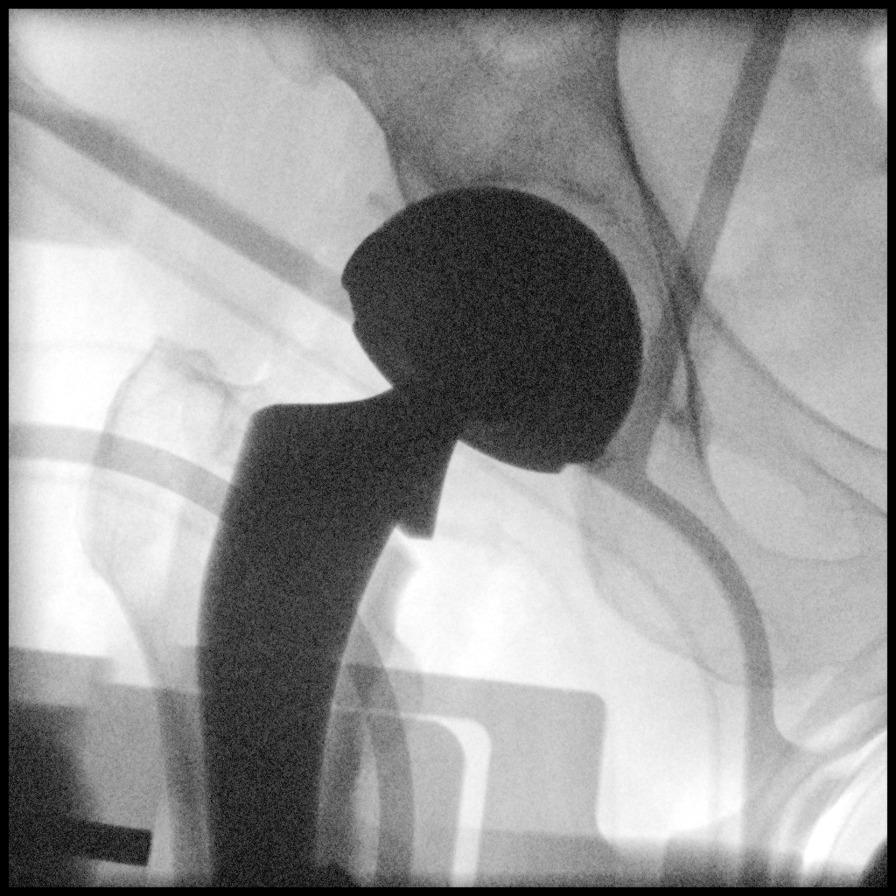
[frame 2/8]
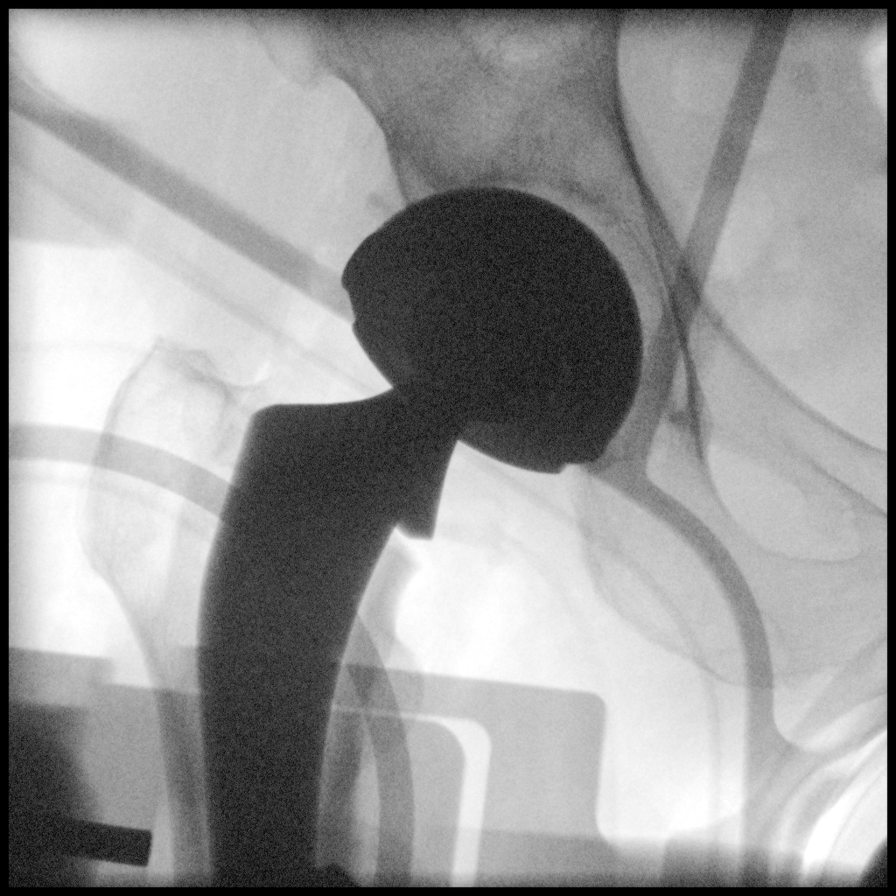
[frame 5/8]
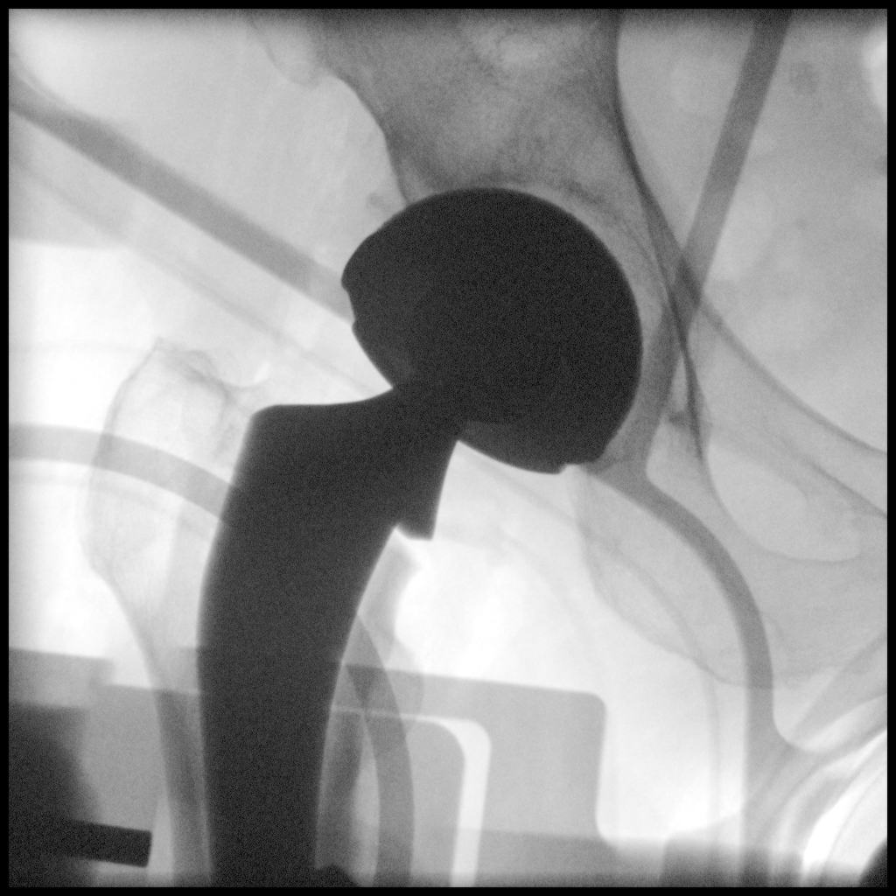
[frame 7/8]
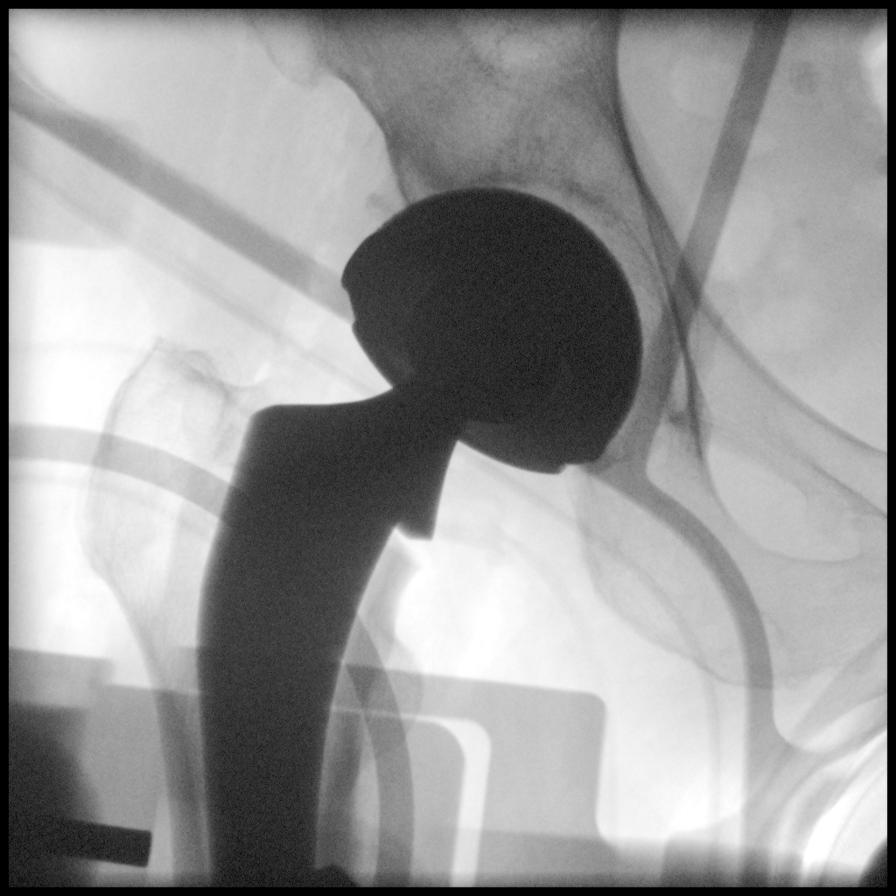

[Series 7: ortho standard · 1 of 1 slices shown (3 of 3)]
[im 1/1]
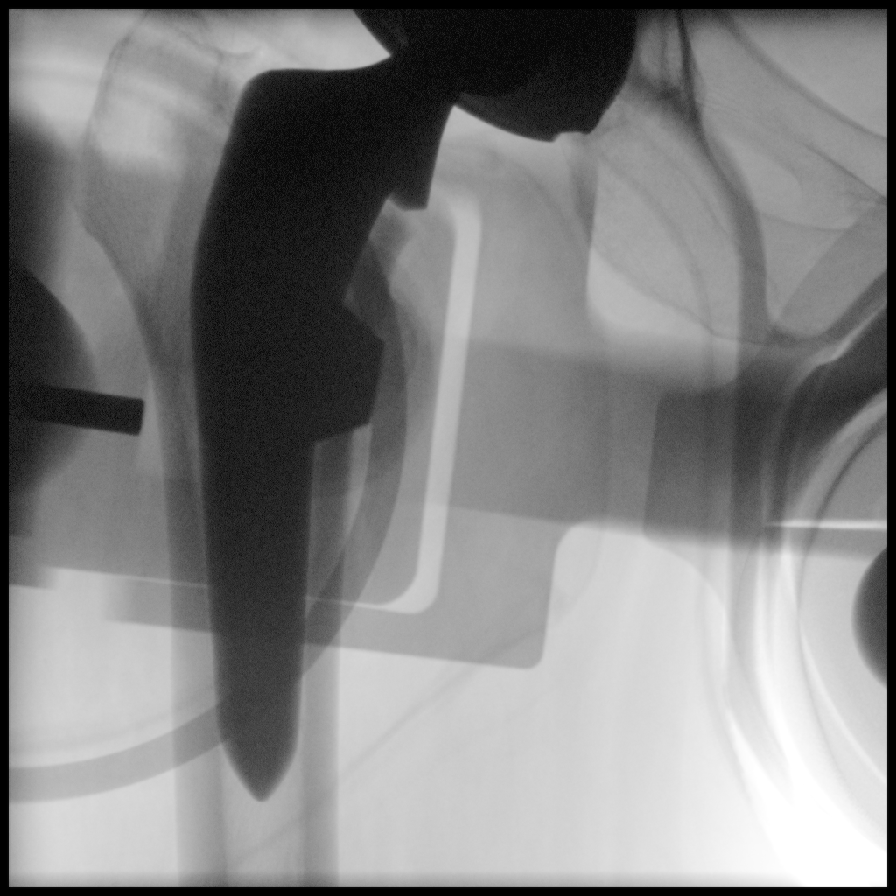

[6 of 6 positions shown; findings below may reference images not displayed]

FINDINGS: Three AP fluoroscopic spot images of the right hip. Bipolar type hip
arthroplasty hardware in place on the second 2 images. Alignment in
the AP direction appears normal.

FLUOROSCOPY TIME:  0 minutes 12 seconds
IMPRESSION: Right hip arthroplasty underway.

## 2018-02-09 IMAGING — DX DG HIP (WITH OR WITHOUT PELVIS) 2-3V*R*
2 series · 2 of 2 positions shown · non-contrast
Comparison: None.

CLINICAL DATA: Right total hip replacement

EXAM:
DG HIP (WITH OR WITHOUT PELVIS) 2-3V RIGHT

[hip ap]
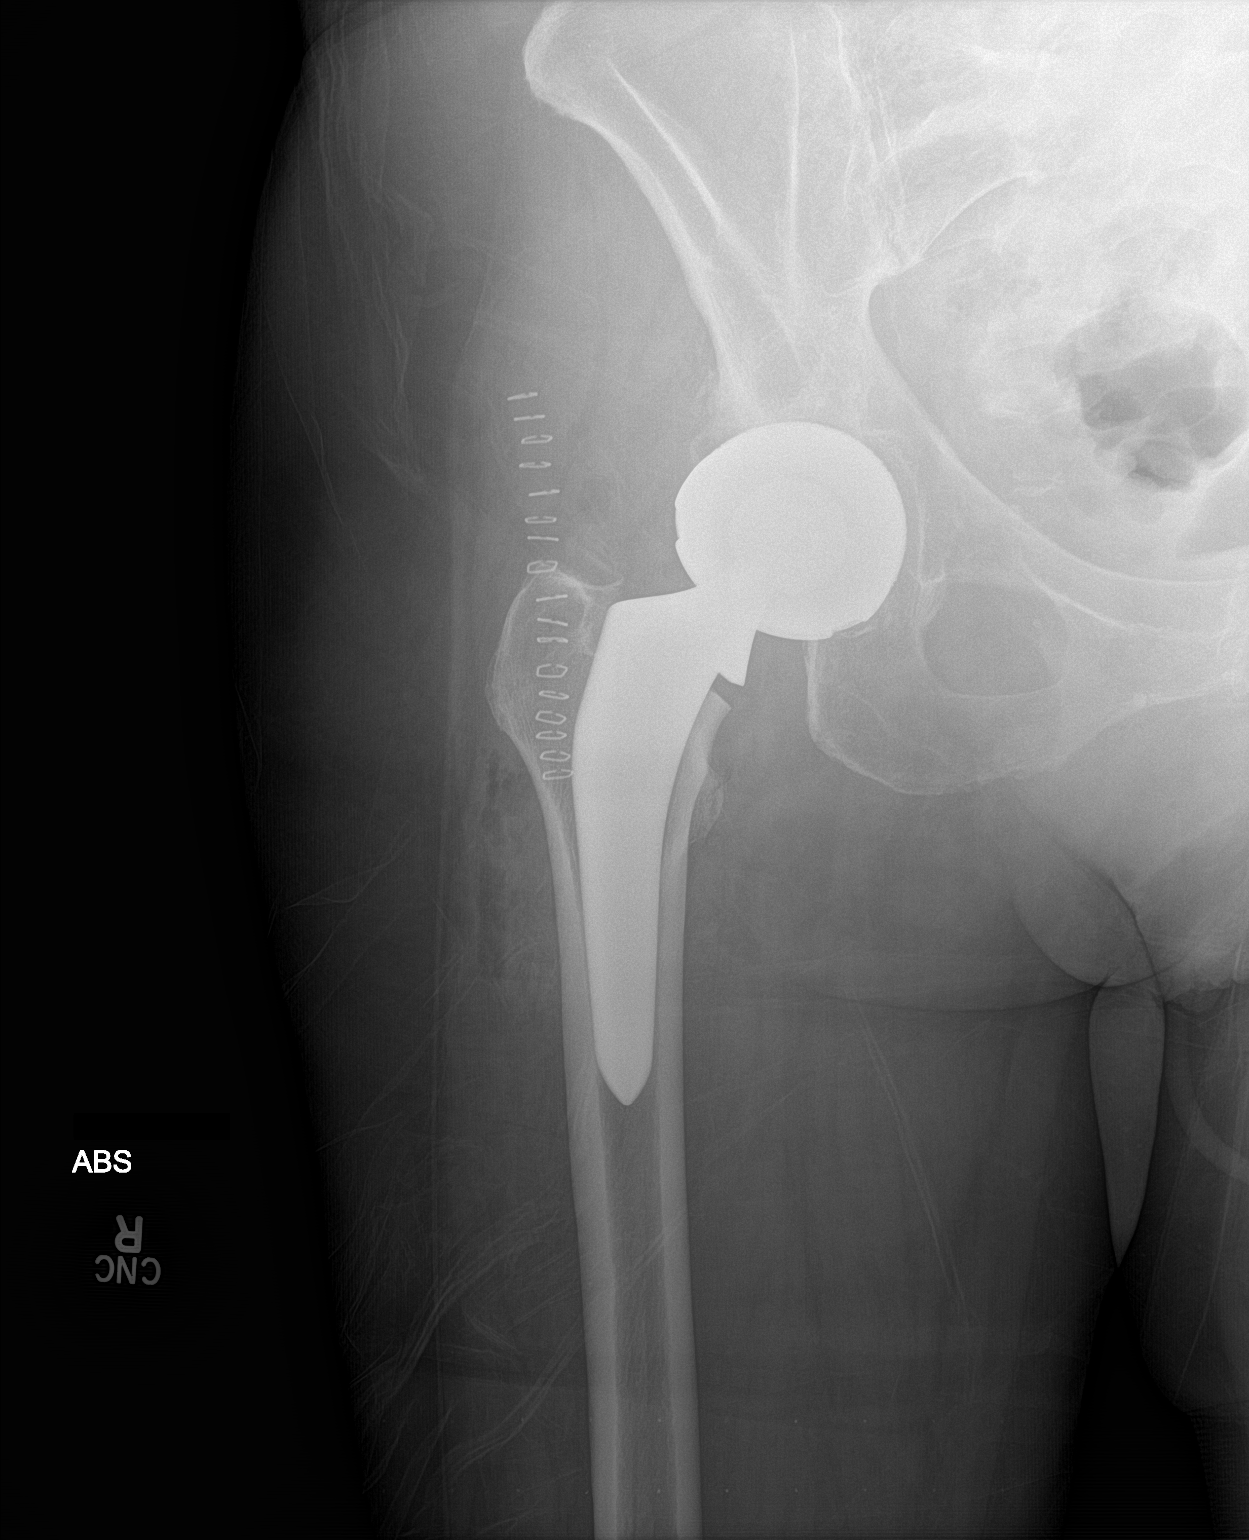

[hip lat]
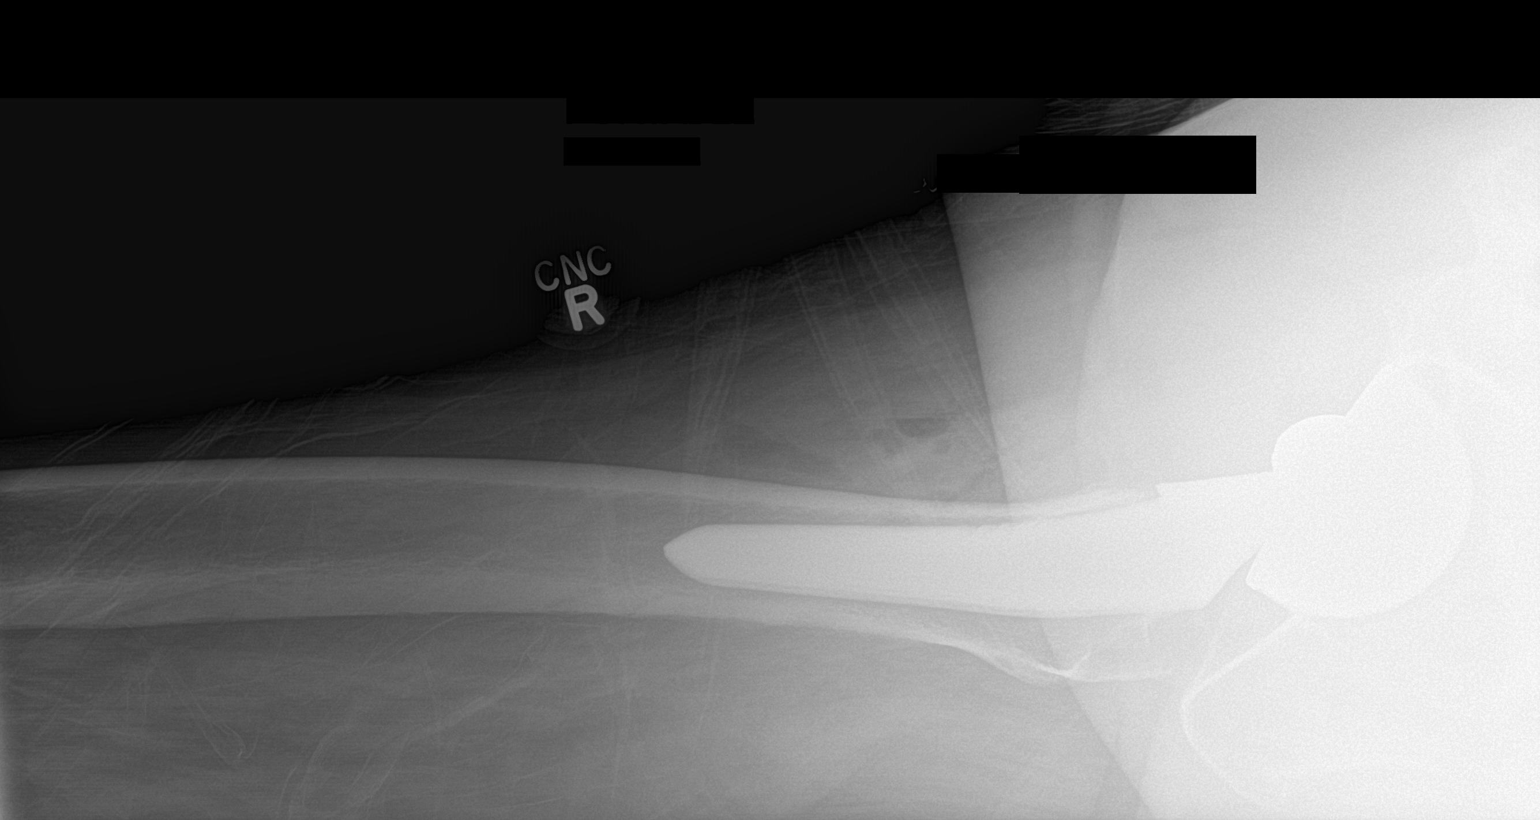

[2 of 2 positions shown; findings below may reference images not displayed]

FINDINGS: Changes of right total hip replacement. Normal alignment. No
hardware or bony complicating feature.
IMPRESSION: Right hip replacement.  No complicating feature.

## 2018-02-14 DIAGNOSIS — Z0000 Encounter for general adult medical examination without abnormal findings: Secondary | ICD-10-CM | POA: Insufficient documentation

## 2018-02-28 ENCOUNTER — Other Ambulatory Visit: Payer: Self-pay

## 2018-02-28 ENCOUNTER — Encounter: Payer: Self-pay | Admitting: Emergency Medicine

## 2018-02-28 ENCOUNTER — Emergency Department
Admission: EM | Admit: 2018-02-28 | Discharge: 2018-02-28 | Disposition: A | Discharge status: Home / Self Care | Payer: Medicare Other | Attending: Emergency Medicine | Admitting: Emergency Medicine

## 2018-02-28 ENCOUNTER — Emergency Department: Payer: Medicare Other

## 2018-02-28 DIAGNOSIS — Z79899 Other long term (current) drug therapy: Secondary | ICD-10-CM | POA: Diagnosis not present

## 2018-02-28 DIAGNOSIS — Z7901 Long term (current) use of anticoagulants: Secondary | ICD-10-CM | POA: Insufficient documentation

## 2018-02-28 DIAGNOSIS — Z87891 Personal history of nicotine dependence: Secondary | ICD-10-CM | POA: Insufficient documentation

## 2018-02-28 DIAGNOSIS — R079 Chest pain, unspecified: Secondary | ICD-10-CM | POA: Insufficient documentation

## 2018-02-28 DIAGNOSIS — I10 Essential (primary) hypertension: Secondary | ICD-10-CM | POA: Insufficient documentation

## 2018-02-28 DIAGNOSIS — F419 Anxiety disorder, unspecified: Secondary | ICD-10-CM | POA: Diagnosis not present

## 2018-02-28 LAB — CBC
HCT: 39.6 % (ref 35.0–47.0)
Hemoglobin: 13.8 g/dL (ref 12.0–16.0)
MCH: 32.9 pg (ref 26.0–34.0)
MCHC: 34.7 g/dL (ref 32.0–36.0)
MCV: 94.6 fL (ref 80.0–100.0)
Platelets: 202 10*3/uL (ref 150–440)
RBC: 4.19 MIL/uL (ref 3.80–5.20)
RDW: 13.3 % (ref 11.5–14.5)
WBC: 6.1 10*3/uL (ref 3.6–11.0)

## 2018-02-28 LAB — BASIC METABOLIC PANEL
Anion gap: 9 (ref 5–15)
BUN: 26 mg/dL — ABNORMAL HIGH (ref 6–20)
CO2: 27 mmol/L (ref 22–32)
Calcium: 9.2 mg/dL (ref 8.9–10.3)
Chloride: 101 mmol/L (ref 101–111)
Creatinine, Ser: 0.87 mg/dL (ref 0.44–1.00)
GFR calc Af Amer: >60 mL/min (ref >60)
GFR calc non Af Amer: >60 mL/min (ref >60)
Glucose, Bld: 109 mg/dL — ABNORMAL HIGH (ref 65–99)
Potassium: 4.5 mmol/L (ref 3.5–5.1)
Sodium: 137 mmol/L (ref 135–145)

## 2018-02-28 LAB — TROPONIN I: Troponin I: <0.03 ng/mL (ref <0.03)

## 2018-02-28 NOTE — ED Triage Notes (Signed)
Pt reports that she has had anxiety for awhile, and has had been under more stress lately and feels like her anxiety is causing her to have chest pain. Denies any N/V, diaphoresis, SOB or radiation.

## 2018-02-28 NOTE — ED Notes (Signed)
Spoke with pt about wait times and what to expect next. Advised pt that I am available for further questions if needed.  

## 2018-02-28 NOTE — ED Provider Notes (Signed)
[facility] Emergency Department Provider Note  ____________________________________________   First MD Initiated Contact with Patient 02/28/18 1412     (approximate)  I have reviewed the triage vital signs and the nursing notes.   HISTORY  Chief Complaint Chest Pain and Anxiety   HPI Jodi Parrish is a 72 y.o. female with a history of bipolar disorder as well as anxiety and osteoarthritis was presented to the emergency department several weeks of intermittent chest pain.  She says that the chest pain feels like a pressure and is to the center of her chest.  She denies any radiation of the pain.  Says the pain is brought on by stress.  Says that she has been stressed over the past year and a half ever since moving here from [facility].  She says that she feels like she never fully unpacked and is now stressed about finishing the unpacking process.  She says that her doctor's appointment have also "piled up" and that she feels unable to make all of her appointments.  She says that she is also been taken off of hydrocodone and feels like she is having difficulty managing her chronic pain.  She is seeing Dr. Rosita Kea of orthopedics for this.  Denies any shortness of breath.  No nausea or vomiting.  No pain with exertion.  Past Medical History:  Diagnosis Date  . Ankle fracture    right  . Anxiety   . Arthritis   . Bipolar disorder (HCC)   . Depression   . Diverticulitis   . Hyperlipidemia   . Hypertension   . Osteoarthritis   . Overactive bladder     Patient Active Problem List   Diagnosis Date Noted  . Primary osteoarthritis of right hip 01/10/2017    Past Surgical History:  Procedure Laterality Date  . ABDOMINAL HYSTERECTOMY  1998  . BARTHOLIN GLAND CYST EXCISION  1976  . COLONOSCOPY    . ELBOW SURGERY Right 1995   crushed elbow with hardware implanted  . JOINT REPLACEMENT    . KNEE SURGERY Left 2000  . PELVIC FLOOR REPAIR  2009   PELVIC FLOOR LIFT  . REPLACEMENT TOTAL KNEE Left 2016  . TONSILLECTOMY  1958  . TOTAL HIP ARTHROPLASTY Left 2008  . TOTAL HIP ARTHROPLASTY Right 01/10/2017   Procedure: TOTAL HIP ARTHROPLASTY ANTERIOR APPROACH;  Surgeon: Kennedy Bucker, MD;  Location: [facility] ORS;  Service: Orthopedics;  Laterality: Right;    Prior to Admission medications   Medication Sig Start Date End Date Taking? Authorizing Provider  amphetamine-dextroamphetamine (ADDERALL) 20 MG tablet Take 10 mg by mouth daily.     [provider]  Calcium Carbonate-Vitamin D (CALCIUM 500 + D PO) Take 1 tablet by mouth daily.    [provider]  Cholecalciferol (VITAMIN D-3) 5000 units TABS Take 1 tablet by mouth daily.    [provider]  citalopram (CELEXA) 20 MG tablet Take 20 mg by mouth daily.    [provider]  clotrimazole-betamethasone (LOTRISONE) cream Apply 1 application topically 2 (two) times daily. For 2 wks 12/14/17   Copland, Helmut Muster B, PA-C  docusate sodium (COLACE) 100 MG capsule Take 1 capsule (100 mg total) by mouth 2 (two) times daily. 01/12/17   Evon Slack, PA-C  enoxaparin (LOVENOX) 40 MG/0.4ML injection Inject 0.4 mLs (40 mg total) into the skin daily. 01/12/17 01/26/17  Evon Slack, PA-C  estradiol (ESTRACE VAGINAL) 0.1 MG/GM vaginal cream Apply 0.5mg  (pea-sized amount)  just inside the vaginal introitus with a finger-tip on Monday, Wednesday and Friday nights. 09/06/17   Michiel CowboyMcGowan, Shannon A, PA-C  estradiol (ESTRACE VAGINAL) 0.1 MG/GM vaginal cream Apply 0.5mg  (pea-sized amount)  just inside the vaginal introitus with a finger-tip on then Monday, Wednesday and Friday nights. 12/06/17   Michiel CowboyMcGowan, Shannon A, PA-C  estradiol (ESTRACE) 1 MG tablet Take 1 mg by mouth 2 (two) times a week.    [provider]  ferrous sulfate 325 (65 FE) MG tablet Take 325 mg by mouth daily with breakfast.    [provider]  folic acid (FOLVITE) 800 MCG tablet Take 800 mcg by mouth  daily.    [provider]  lamoTRIgine (LAMICTAL) 200 MG tablet Take 200 mg by mouth at bedtime.    [provider]  Magnesium 250 MG TABS Take 1 tablet by mouth daily.    [provider]  meloxicam (MOBIC) 15 MG tablet Take 15 mg by mouth daily.    [provider]  Multiple Vitamins-Minerals (MULTIVITAMIN WITH MINERALS) tablet Take 1 tablet by mouth daily.    [provider]  neomycin-polymyxin-dexameth (MAXITROL) 0.1 % OINT 1 application.    [provider]  Omega-3 Fatty Acids (FISH OIL) 1000 MG CAPS Take 1 capsule by mouth daily.    [provider]  pravastatin (PRAVACHOL) 40 MG tablet Take 40 mg by mouth at bedtime.    [provider]  Probiotic Product (PROBIOTIC DAILY PO) Take 1 capsule by mouth daily.    [provider]  propranolol ER (INDERAL LA) 60 MG 24 hr capsule Take 60 mg by mouth daily.    [provider]  solifenacin (VESICARE) 5 MG tablet Take 1 tablet (5 mg total) by mouth daily. 12/06/17   Michiel CowboyMcGowan, Shannon A, PA-C  spironolactone (ALDACTONE) 100 MG tablet Take 100 mg by mouth at bedtime.    [provider]  temazepam (RESTORIL) 15 MG capsule Take 15 mg by mouth at bedtime.    [provider]  vitamin B-12 (CYANOCOBALAMIN) 1000 MCG tablet Take 1,000 mcg by mouth daily.    [provider]    Allergies Abilify [aripiprazole] and Duloxetine  Family History  Problem Relation Age of Onset  . Parkinson's disease Mother   . CAD Father   . Hypertension Father   . Breast cancer Neg Hx     Social History Social History   Tobacco Use  . Smoking status: Former Smoker    Types: Cigarettes  . Smokeless tobacco: Never Used  Substance Use Topics  . Alcohol use: No  . Drug use: No    Review of Systems  Constitutional: No fever/chills Eyes: No visual changes. ENT: No sore throat. Cardiovascular: As above  respiratory: Denies shortness of  breath. Gastrointestinal: No abdominal pain.  No nausea, no vomiting.  No diarrhea.  No constipation. Genitourinary: Negative for dysuria. Musculoskeletal: Negative for back pain. Skin: Negative for rash. Neurological: Negative for headaches, focal weakness or numbness.   ____________________________________________   PHYSICAL EXAM:  VITAL SIGNS: ED Triage Vitals  Enc Vitals Group     BP 02/28/18 1245 (!) 149/80     Pulse Rate 02/28/18 1245 69     Resp 02/28/18 1245 20     Temp 02/28/18 1245 98.2 F (36.8 C)     Temp Source 02/28/18 1245 Oral     SpO2 02/28/18 1245 99 %     Weight 02/28/18 1247 180 lb (81.6 kg)     Height  02/28/18 1247 5\' 8"  (1.727 m)     Head Circumference --      Peak Flow --      Pain Score 02/28/18 1247 7     Pain Loc --      Pain Edu? --      Excl. in GC? --     Constitutional: Alert and oriented. Well appearing and in no acute distress. Eyes: Conjunctivae are normal.  Head: Atraumatic. Nose: No congestion/rhinnorhea. Mouth/Throat: Mucous membranes are moist.  Neck: No stridor.   Cardiovascular: Normal rate, regular rhythm. Grossly normal heart sounds.  Respiratory: Normal respiratory effort.  No retractions. Lungs CTAB. Gastrointestinal: Soft and nontender. No distention.  Musculoskeletal: No lower extremity tenderness nor edema.  No joint effusions. Neurologic:  Normal speech and language. No gross focal neurologic deficits are appreciated. Skin:  Skin is warm, dry and intact. No rash noted. Psychiatric: Mood and affect are normal. Speech and behavior are normal.  ____________________________________________   LABS (all labs ordered are listed, but only abnormal results are displayed)  Labs Reviewed  BASIC METABOLIC PANEL - Abnormal; Notable for the following components:      Result Value   Glucose, Bld 109 (*)    BUN 26 (*)    All other components within normal limits  CBC  TROPONIN I    ____________________________________________  EKG  ED ECG REPORT I, Arelia Longest, the attending physician, personally viewed and interpreted this ECG.   Date: 02/28/2018  EKG Time: 1245  Rate: 71  Rhythm: normal sinus rhythm  Axis: Normal  Intervals:none  ST&T Change: No ST segment elevation or depression.  No abnormal T wave inversion.  ____________________________________________  RADIOLOGY  Mild left basilar atelectasis.  Sequela of granulomatous disease.  Degenerative changes in the left glenohumeral joint. ____________________________________________   PROCEDURES  Procedure(s) performed:   Procedures  Critical Care performed:   ____________________________________________   INITIAL IMPRESSION / ASSESSMENT AND PLAN / ED COURSE  Pertinent labs & imaging results that were available during my care of the patient were reviewed by me and considered in my medical decision making (see chart for details).  Differential diagnosis includes, but is not limited to, ACS, aortic dissection, pulmonary embolism, cardiac tamponade, pneumothorax, pneumonia, pericarditis, myocarditis, GI-related causes including esophagitis/gastritis, and musculoskeletal chest wall pain.   As part of my medical decision making, I reviewed the following data within the electronic MEDICAL RECORD NUMBER Notes from prior ED visits  ----------------------------------------- 4:05 PM on 02/28/2018 -----------------------------------------  Patient's pain brought on by anxiety and has been going on for weeks.  Very reassuring cardiac work-up with normal troponin as well as EKG after several weeks of symptoms.  Symptoms brought on by anxiety.  No exertional symptoms.  I will give her cardiology follow-up with do not believe there is reason to keep her further in the emergency department for observation or admission.  She is understanding of the diagnosis as well as the treatment plan willing to  comply. ____________________________________________   FINAL CLINICAL IMPRESSION(S) / ED DIAGNOSES  Chest pain.    NEW MEDICATIONS STARTED DURING THIS VISIT:  New Prescriptions   No medications on file     Note:  This document was prepared using Dragon voice recognition software and may include unintentional dictation errors.     Myrna Blazer, MD 02/28/18 1606

## 2018-04-09 ENCOUNTER — Encounter: Payer: Self-pay | Admitting: *Deleted

## 2018-04-10 ENCOUNTER — Encounter: Payer: Self-pay | Admitting: Anesthesiology

## 2018-04-10 ENCOUNTER — Ambulatory Visit: Payer: Medicare Other | Admitting: Anesthesiology

## 2018-04-10 ENCOUNTER — Ambulatory Visit
Admission: RE | Admit: 2018-04-10 | Discharge: 2018-04-10 | Disposition: A | Discharge status: Home / Self Care | Payer: Medicare Other | Source: Ambulatory Visit | Attending: Internal Medicine | Admitting: Internal Medicine

## 2018-04-10 ENCOUNTER — Encounter
Admission: RE | Disposition: A | Discharge status: Home / Self Care | Payer: Self-pay | Source: Ambulatory Visit | Attending: Internal Medicine

## 2018-04-10 DIAGNOSIS — K222 Esophageal obstruction: Secondary | ICD-10-CM | POA: Diagnosis not present

## 2018-04-10 DIAGNOSIS — Z888 Allergy status to other drugs, medicaments and biological substances status: Secondary | ICD-10-CM | POA: Insufficient documentation

## 2018-04-10 DIAGNOSIS — F329 Major depressive disorder, single episode, unspecified: Secondary | ICD-10-CM | POA: Insufficient documentation

## 2018-04-10 DIAGNOSIS — R1310 Dysphagia, unspecified: Secondary | ICD-10-CM | POA: Diagnosis present

## 2018-04-10 DIAGNOSIS — I10 Essential (primary) hypertension: Secondary | ICD-10-CM | POA: Insufficient documentation

## 2018-04-10 DIAGNOSIS — Z86010 Personal history of colonic polyps: Secondary | ICD-10-CM | POA: Diagnosis not present

## 2018-04-10 DIAGNOSIS — Z7901 Long term (current) use of anticoagulants: Secondary | ICD-10-CM | POA: Diagnosis not present

## 2018-04-10 DIAGNOSIS — K640 First degree hemorrhoids: Secondary | ICD-10-CM | POA: Diagnosis not present

## 2018-04-10 DIAGNOSIS — Z1211 Encounter for screening for malignant neoplasm of colon: Secondary | ICD-10-CM | POA: Diagnosis not present

## 2018-04-10 DIAGNOSIS — K635 Polyp of colon: Secondary | ICD-10-CM | POA: Insufficient documentation

## 2018-04-10 DIAGNOSIS — M1990 Unspecified osteoarthritis, unspecified site: Secondary | ICD-10-CM | POA: Diagnosis not present

## 2018-04-10 DIAGNOSIS — K5730 Diverticulosis of large intestine without perforation or abscess without bleeding: Secondary | ICD-10-CM | POA: Diagnosis not present

## 2018-04-10 DIAGNOSIS — E785 Hyperlipidemia, unspecified: Secondary | ICD-10-CM | POA: Insufficient documentation

## 2018-04-10 DIAGNOSIS — Z79899 Other long term (current) drug therapy: Secondary | ICD-10-CM | POA: Diagnosis not present

## 2018-04-10 DIAGNOSIS — F419 Anxiety disorder, unspecified: Secondary | ICD-10-CM | POA: Diagnosis not present

## 2018-04-10 DIAGNOSIS — Z87891 Personal history of nicotine dependence: Secondary | ICD-10-CM | POA: Diagnosis not present

## 2018-04-10 HISTORY — PX: COLONOSCOPY WITH PROPOFOL: SHX5780

## 2018-04-10 HISTORY — PX: ESOPHAGOGASTRODUODENOSCOPY (EGD) WITH PROPOFOL: SHX5813

## 2018-04-10 SURGERY — COLONOSCOPY WITH PROPOFOL
Anesthesia: General

## 2018-04-10 MED ORDER — SODIUM CHLORIDE 0.9 % IV SOLN
INTRAVENOUS | Status: DC
  Administered 2018-04-10: 1000 mL via INTRAVENOUS

## 2018-04-10 MED ORDER — PROPOFOL 500 MG/50ML IV EMUL
INTRAVENOUS | Status: DC | PRN
  Administered 2018-04-10: 150 ug/kg/min via INTRAVENOUS

## 2018-04-10 MED ORDER — LIDOCAINE HCL (PF) 2 % IJ SOLN
INTRAMUSCULAR | Status: DC | PRN
  Administered 2018-04-10: 100 mg via INTRADERMAL

## 2018-04-10 MED ORDER — LIDOCAINE HCL (PF) 1 % IJ SOLN
INTRAMUSCULAR | Status: AC
  Administered 2018-04-10: 0.3 mL
  Filled 2018-04-10: qty 2

## 2018-04-10 MED ORDER — PROPOFOL 10 MG/ML IV BOLUS
INTRAVENOUS | Status: DC | PRN
  Administered 2018-04-10: 60 mg via INTRAVENOUS

## 2018-04-10 NOTE — Anesthesia Post-op Follow-up Note (Signed)
Anesthesia QCDR form completed.        

## 2018-04-10 NOTE — Anesthesia Procedure Notes (Signed)
Date/Time: 04/10/2018 1:36 PM Performed by: Junious SilkNoles, Laiana Fratus, CRNA Pre-anesthesia Checklist: Patient identified, Emergency Drugs available, Suction available, Patient being monitored and Timeout performed Oxygen Delivery Method: Nasal cannula

## 2018-04-10 NOTE — Anesthesia Postprocedure Evaluation (Signed)
Anesthesia Post Note  Patient: Jodi KirschnerJane A De Parrish  Procedure(s) Performed: COLONOSCOPY WITH PROPOFOL (N/A ) ESOPHAGOGASTRODUODENOSCOPY (EGD) WITH PROPOFOL (N/A )  Anesthesia Type: General Level of consciousness: awake and alert and oriented Pain management: pain level controlled Vital Signs Assessment: post-procedure vital signs reviewed and stable Respiratory status: spontaneous breathing Cardiovascular status: blood pressure returned to baseline Anesthetic complications: no     Last Vitals:  Vitals:   04/10/18 1220 04/10/18 1354  BP: 134/85   Pulse: 76   Resp: 16   Temp: (!) 35.6 C (!) 36.1 C  SpO2: 100%     Last Pain:  Vitals:   04/10/18 1354  TempSrc: Tympanic  PainSc:                  [name]

## 2018-04-10 NOTE — Interval H&P Note (Signed)
History and Physical Interval Note:  04/10/2018 12:19 PM  Mikey KirschnerJane A De Ryke  has presented today for surgery, with the diagnosis of personal history polyps; pharyngeal dysphagia  The various methods of treatment have been discussed with the patient and family. After consideration of risks, benefits and other options for treatment, the patient has consented to  Procedure(s): COLONOSCOPY WITH PROPOFOL (N/A) ESOPHAGOGASTRODUODENOSCOPY (EGD) WITH PROPOFOL (N/A) as a surgical intervention .  The patient's history has been reviewed, patient examined, no change in status, stable for surgery.  I have reviewed the patient's chart and labs.  Questions were answered to the patient's satisfaction.     [place], [place]

## 2018-04-10 NOTE — Op Note (Signed)
[facility] Gastroenterology Patient Name: Jodi Parrish Procedure Date: 04/10/2018 1:07 PM MRN: [id number] Account #: [id number] Date of Birth: January 17, 1946 Admit Type: Outpatient Age: 72 Room: [facility] ENDO ROOM 2 Gender: Female Note Status: Finalized Procedure:            Colonoscopy Indications:          High risk colon cancer surveillance: Personal history                        of colonic polyps Providers:            Boykin Nearing. Norma Fredrickson MD, MD Referring MD:         Marya Amsler. Dareen Piano MD, MD (Referring MD) Medicines:            Propofol per Anesthesia Complications:        No immediate complications. Procedure:            Pre-Anesthesia Assessment:                       - The risks and benefits of the procedure and the                        sedation options and risks were discussed with the                        patient. All questions were answered and informed                        consent was obtained.                       - Patient identification and proposed procedure were                        verified prior to the procedure by the nurse. The                        procedure was verified in the procedure room.                       - ASA Grade Assessment: III - A patient with severe                        systemic disease.                       - After reviewing the risks and benefits, the patient                        was deemed in satisfactory condition to undergo the                        procedure.                       After obtaining informed consent, the colonoscope was                        passed under direct vision. Throughout the procedure,  the patient's blood pressure, pulse, and oxygen                        saturations were monitored continuously. The                        Colonoscope was introduced through the anus and                        advanced to the the cecum, identified by appendiceal     orifice and ileocecal valve. The colonoscopy was                        technically difficult and complex due to significant                        looping. Successful completion of the procedure was                        aided by changing the patient to a prone position. The                        patient tolerated the procedure well. The quality of                        the bowel preparation was good. The entire colon was                        examined. The ileocecal valve, appendiceal orifice, and                        rectum were photographed. Findings:      The perianal and digital rectal examinations were normal. Pertinent       negatives include normal sphincter tone and no palpable rectal lesions.      Many small-mouthed diverticula were found in the sigmoid colon.      A 3 mm polyp was found in the sigmoid colon. The polyp was sessile. The       polyp was removed with a jumbo cold forceps. Resection and retrieval       were complete.      Non-bleeding internal hemorrhoids were found during retroflexion. The       hemorrhoids were Grade I (internal hemorrhoids that do not prolapse).      The exam was otherwise without abnormality. Impression:           - Diverticulosis in the sigmoid colon.                       - One 3 mm polyp in the sigmoid colon, removed with a                        jumbo cold forceps. Resected and retrieved.                       - Non-bleeding internal hemorrhoids.                       - The examination was otherwise normal. Recommendation:       - Patient has a contact number available for  emergencies. The signs and symptoms of potential                        delayed complications were discussed with the patient.                        Return to normal activities tomorrow. Written discharge                        instructions were provided to the patient.                       - Resume previous diet.                       -  Continue present medications.                       - Await pathology results.                       - Repeat colonoscopy is recommended for surveillance.                        The colonoscopy date will be determined after pathology                        results from today's exam become available for review.                       - Monitor results to esophageal dilation                       - Return to my office in 3 months.                       - The findings and recommendations were discussed with                        the patient and their family. Procedure Code(s):    --- Professional ---                       [id number], Colonoscopy, flexible; with biopsy, single or                        multiple Diagnosis Code(s):    --- Professional ---                       K57.30, Diverticulosis of large intestine without                        perforation or abscess without bleeding                       D12.5, Benign neoplasm of sigmoid colon                       K64.0, First degree hemorrhoids                       Z86.010, Personal history of colonic polyps CPT copyright 2017 American Medical Association. All rights reserved. The codes  documented in this report are preliminary and upon coder review may  be revised to meet current compliance requirements. Stanton Kidney MD, MD 04/10/2018 1:53:18 PM This report has been signed electronically. Number of Addenda: 0 Note Initiated On: 04/10/2018 1:07 PM Scope Withdrawal Time: 0 hours 7 minutes 50 seconds  Total Procedure Duration: 0 hours 19 minutes 19 seconds       [facility]

## 2018-04-10 NOTE — Transfer of Care (Signed)
Immediate Anesthesia Transfer of Care Note  Patient: Jodi KirschnerJane A De Ryke  Procedure(s) Performed: COLONOSCOPY WITH PROPOFOL (N/A ) ESOPHAGOGASTRODUODENOSCOPY (EGD) WITH PROPOFOL (N/A )  Patient Location: PACU  Anesthesia Type:General  Level of Consciousness: sedated  Airway & Oxygen Therapy: Patient Spontanous Breathing and Patient connected to nasal cannula oxygen  Post-op Assessment: Report given to RN and Post -op Vital signs reviewed and stable  Post vital signs: Reviewed and stable  Last Vitals:  Vitals Value Taken Time  BP 121/91 04/10/2018  1:56 PM  Temp    Pulse 75 04/10/2018  1:57 PM  Resp 13 04/10/2018  1:57 PM  SpO2 94 % 04/10/2018  1:57 PM  Vitals shown include unvalidated device data.  Last Pain:  Vitals:   04/10/18 1220  TempSrc: Tympanic  PainSc: 0-No pain         Complications: No apparent anesthesia complications

## 2018-04-10 NOTE — H&P (Signed)
Outpatient short stay form Pre-procedure 04/10/2018 12:17 PM Jodi Parrish, M.D.  Primary Physician: Einar Crow, M.D.  Reason for visit: Pharyngeal dysphagia and personal hx of colon polyps.  History of present illness:  As above. Patient denies change in bowel habits, rectal bleeding, weight loss or abdominal pain. Has intermittent dysphagia to solids localized in the throat region without previous symptoms of food impaction, vomiting. Has some noncardiac chest pain.   No current facility-administered medications for this encounter.   Medications Prior to Admission  Medication Sig Dispense Refill Last Dose  . amphetamine-dextroamphetamine (ADDERALL) 20 MG tablet Take 10 mg by mouth daily.    Taking  . Calcium Carbonate-Vitamin D (CALCIUM 500 + D PO) Take 1 tablet by mouth daily.   Taking  . Cholecalciferol (VITAMIN D-3) 5000 units TABS Take 1 tablet by mouth daily.   Taking  . citalopram (CELEXA) 20 MG tablet Take 20 mg by mouth daily.   Taking  . clotrimazole-betamethasone (LOTRISONE) cream Apply 1 application topically 2 (two) times daily. For 2 wks 15 g 0   . docusate sodium (COLACE) 100 MG capsule Take 1 capsule (100 mg total) by mouth 2 (two) times daily. 10 capsule 0 Taking  . enoxaparin (LOVENOX) 40 MG/0.4ML injection Inject 0.4 mLs (40 mg total) into the skin daily. 14 Syringe 0   . estradiol (ESTRACE VAGINAL) 0.1 MG/GM vaginal cream Apply 0.5mg  (pea-sized amount)  just inside the vaginal introitus with a finger-tip on Monday, Wednesday and Friday nights. 30 g 12 Taking  . estradiol (ESTRACE VAGINAL) 0.1 MG/GM vaginal cream Apply 0.5mg  (pea-sized amount)  just inside the vaginal introitus with a finger-tip on then Monday, Wednesday and Friday nights. 30 g 12 Taking  . estradiol (ESTRACE) 1 MG tablet Take 1 mg by mouth 2 (two) times a week.   Taking  . ferrous sulfate 325 (65 FE) MG tablet Take 325 mg by mouth daily with breakfast.   Taking  . folic acid (FOLVITE) 800 MCG  tablet Take 800 mcg by mouth daily.   Taking  . lamoTRIgine (LAMICTAL) 200 MG tablet Take 200 mg by mouth at bedtime.   Taking  . Magnesium 250 MG TABS Take 1 tablet by mouth daily.   Taking  . meloxicam (MOBIC) 15 MG tablet Take 15 mg by mouth daily.   Taking  . Multiple Vitamins-Minerals (MULTIVITAMIN WITH MINERALS) tablet Take 1 tablet by mouth daily.   Taking  . neomycin-polymyxin-dexameth (MAXITROL) 0.1 % OINT 1 application.   Taking  . Omega-3 Fatty Acids (FISH OIL) 1000 MG CAPS Take 1 capsule by mouth daily.   Taking  . pravastatin (PRAVACHOL) 40 MG tablet Take 40 mg by mouth at bedtime.   Taking  . Probiotic Product (PROBIOTIC DAILY PO) Take 1 capsule by mouth daily.   Taking  . propranolol ER (INDERAL LA) 60 MG 24 hr capsule Take 60 mg by mouth daily.   Taking  . solifenacin (VESICARE) 5 MG tablet Take 1 tablet (5 mg total) by mouth daily. 90 tablet 3 Taking  . spironolactone (ALDACTONE) 100 MG tablet Take 100 mg by mouth at bedtime.   Taking  . temazepam (RESTORIL) 15 MG capsule Take 15 mg by mouth at bedtime.   Taking  . vitamin B-12 (CYANOCOBALAMIN) 1000 MCG tablet Take 1,000 mcg by mouth daily.   Taking     Allergies  Allergen Reactions  . Abilify [Aripiprazole] Anxiety    SEVERE HYPERACTIVITY  . Duloxetine Anxiety     Past Medical  History:  Diagnosis Date  . Ankle fracture    right  . Anxiety   . Arthritis   . Bipolar disorder (HCC)   . Depression   . Diverticulitis   . Hyperlipidemia   . Hypertension   . Osteoarthritis   . Overactive bladder     Review of systems:  Otherwise negative.    Physical Exam  Gen: Alert, oriented. Appears stated age.  HEENT: [place]/AT. PERRLA. Lungs: CTA, no wheezes. CV: RR nl S1, S2. Abd: soft, benign, no masses. BS+ Ext: No edema. Pulses 2+    Planned procedures: Proceed with EGD and colonoscopy. The patient understands the nature of the planned procedure, indications, risks, alternatives and potential complications  including but not limited to bleeding, infection, perforation, damage to internal organs and possible oversedation/side effects from anesthesia. The patient agrees and gives consent to proceed.  Please refer to procedure notes for findings, recommendations and patient disposition/instructions.     Jodi K. Norma Fredricksonoledo, M.D. Gastroenterology 04/10/2018  12:17 PM

## 2018-04-10 NOTE — Op Note (Signed)
[facility] Gastroenterology Patient Name: Jodi Parrish Procedure Date: 04/10/2018 1:08 PM MRN: [id number] Account #: [id number] Date of Birth: 06/25/1946 Admit Type: Outpatient Age: [age] [facility] Gender: Female Note Status: Finalized Procedure:            Upper GI endoscopy Indications:          Dysphagia Providers:            Boykin Nearingeodoro K. Norma Fredricksonoledo MD, MD Referring MD:         Marya AmslerMarshall W. Dareen PianoAnderson MD, MD (Referring MD) Medicines:            Propofol per Anesthesia Complications:        No immediate complications. Procedure:            Pre-Anesthesia Assessment:                       - The risks and benefits of the procedure and the                        sedation options and risks were discussed with the                        patient. All questions were answered and informed                        consent was obtained.                       - Patient identification and proposed procedure were                        verified prior to the procedure by the nurse. The                        procedure was verified in the procedure room.                       - ASA Grade Assessment: III - A patient with severe                        systemic disease.                       - After reviewing the risks and benefits, the patient                        was deemed in satisfactory condition to undergo the                        procedure.                       After obtaining informed consent, the endoscope was                        passed under direct vision. Throughout the procedure,                        the patient's blood pressure, pulse, and oxygen  saturations were monitored continuously. The Endoscope                        was introduced through the mouth, and advanced to the                        third part of duodenum. The upper GI endoscopy was                        accomplished without difficulty. The patient tolerated                 the procedure well. Findings:      One benign-appearing, intrinsic mild (non-circumferential scarring)       stenosis was found in the distal esophagus. This stenosis measured less       than one cm (in length). The stenosis was traversed. The scope was       withdrawn. Dilation was performed with a Maloney dilator with no       resistance at 54 Fr.      The entire examined stomach was normal.      The examined duodenum was normal. Impression:           - Benign-appearing esophageal stenosis. Dilated.                       - Normal stomach.                       - Normal examined duodenum.                       - No specimens collected. Recommendation:       - Monitor results to esophageal dilation                       - Proceed with colonoscopy Procedure Code(s):    --- Professional ---                       [phone], Esophagogastroduodenoscopy, flexible, transoral;                        diagnostic, including collection of specimen(s) by                        brushing or washing, when performed (separate procedure)                       43450, Dilation of esophagus, by unguided sound or                        bougie, single or multiple passes Diagnosis Code(s):    --- Professional ---                       R13.10, Dysphagia, unspecified                       K22.2, Esophageal obstruction CPT copyright 2017 [employer]. All rights reserved. The codes documented in this report are preliminary and upon coder review may  be revised to meet current compliance requirements. Stanton Kidney MD, MD 04/10/2018 1:28:01 PM This report has been signed electronically. Number of Addenda:  0 Note Initiated On: 04/10/2018 1:08 PM      [facility]

## 2018-04-10 NOTE — Anesthesia Preprocedure Evaluation (Signed)
Anesthesia Evaluation  Patient identified by MRN, date of birth, ID band Patient awake    Reviewed: Allergy & Precautions, NPO status , Patient's Chart, lab work & pertinent test results, reviewed documented beta blocker date and time   Airway Mallampati: II  TM Distance: >3 FB     Dental  (+) Chipped   Pulmonary former smoker,           Cardiovascular hypertension, Pt. on medications      Neuro/Psych PSYCHIATRIC DISORDERS Anxiety Depression Bipolar Disorder  Neuromuscular disease    GI/Hepatic   Endo/Other    Renal/GU      Musculoskeletal  (+) Arthritis ,   Abdominal   Peds  Hematology   Anesthesia Other Findings   Reproductive/Obstetrics                             Anesthesia Physical Anesthesia Plan  ASA: III  Anesthesia Plan: General   Post-op Pain Management:    Induction: Intravenous  PONV Risk Score and Plan:   Airway Management Planned:   Additional Equipment:   Intra-op Plan:   Post-operative Plan:   Informed Consent: I have reviewed the patients History and Physical, chart, labs and discussed the procedure including the risks, benefits and alternatives for the proposed anesthesia with the patient or authorized representative who has indicated his/her understanding and acceptance.     Plan Discussed with: CRNA  Anesthesia Plan Comments:         Anesthesia Quick Evaluation

## 2018-04-12 ENCOUNTER — Encounter: Payer: Self-pay | Admitting: Internal Medicine

## 2018-04-12 LAB — SURGICAL PATHOLOGY

## 2018-05-10 ENCOUNTER — Ambulatory Visit: Payer: Medicare Other

## 2018-05-17 ENCOUNTER — Ambulatory Visit
Admission: RE | Admit: 2018-05-17 | Discharge: 2018-05-17 | Disposition: A | Discharge status: Home / Self Care | Payer: Medicare Other | Source: Ambulatory Visit | Attending: Orthopedic Surgery | Admitting: Orthopedic Surgery

## 2018-05-17 DIAGNOSIS — M48061 Spinal stenosis, lumbar region without neurogenic claudication: Secondary | ICD-10-CM | POA: Diagnosis not present

## 2018-05-17 DIAGNOSIS — M9943 Connective tissue stenosis of neural canal of lumbar region: Secondary | ICD-10-CM

## 2018-07-13 DIAGNOSIS — Z9989 Dependence on other enabling machines and devices: Secondary | ICD-10-CM | POA: Insufficient documentation

## 2018-07-19 DIAGNOSIS — K222 Esophageal obstruction: Secondary | ICD-10-CM | POA: Insufficient documentation

## 2018-07-19 DIAGNOSIS — R1319 Other dysphagia: Secondary | ICD-10-CM | POA: Insufficient documentation

## 2018-10-04 ENCOUNTER — Encounter: Payer: Self-pay | Admitting: *Deleted

## 2018-10-04 ENCOUNTER — Emergency Department: Payer: Medicare Other

## 2018-10-04 ENCOUNTER — Emergency Department
Admission: EM | Admit: 2018-10-04 | Discharge: 2018-10-04 | Disposition: A | Discharge status: Home / Self Care | Payer: Medicare Other | Attending: Emergency Medicine | Admitting: Emergency Medicine

## 2018-10-04 DIAGNOSIS — I10 Essential (primary) hypertension: Secondary | ICD-10-CM | POA: Insufficient documentation

## 2018-10-04 DIAGNOSIS — Z87891 Personal history of nicotine dependence: Secondary | ICD-10-CM | POA: Insufficient documentation

## 2018-10-04 DIAGNOSIS — R42 Dizziness and giddiness: Secondary | ICD-10-CM | POA: Insufficient documentation

## 2018-10-04 DIAGNOSIS — E860 Dehydration: Secondary | ICD-10-CM | POA: Insufficient documentation

## 2018-10-04 DIAGNOSIS — Z79899 Other long term (current) drug therapy: Secondary | ICD-10-CM | POA: Insufficient documentation

## 2018-10-04 LAB — CBC
HCT: 35.6 % — ABNORMAL LOW (ref 36.0–46.0)
Hemoglobin: 11.8 g/dL — ABNORMAL LOW (ref 12.0–15.0)
MCH: 31.6 pg (ref 26.0–34.0)
MCHC: 33.1 g/dL (ref 30.0–36.0)
MCV: 95.4 fL (ref 80.0–100.0)
Platelets: 291 10*3/uL (ref 150–400)
RBC: 3.73 MIL/uL — ABNORMAL LOW (ref 3.87–5.11)
RDW: 12.4 % (ref 11.5–15.5)
WBC: 8.5 10*3/uL (ref 4.0–10.5)
nRBC: 0 % (ref 0.0–0.2)

## 2018-10-04 LAB — BASIC METABOLIC PANEL
Anion gap: 8 (ref 5–15)
BUN: 26 mg/dL — ABNORMAL HIGH (ref 8–23)
CO2: 27 mmol/L (ref 22–32)
Calcium: 10.2 mg/dL (ref 8.9–10.3)
Chloride: 92 mmol/L — ABNORMAL LOW (ref 98–111)
Creatinine, Ser: 1.6 mg/dL — ABNORMAL HIGH (ref 0.44–1.00)
GFR calc Af Amer: 37 mL/min — ABNORMAL LOW (ref >60)
GFR calc non Af Amer: 32 mL/min — ABNORMAL LOW (ref >60)
Glucose, Bld: 113 mg/dL — ABNORMAL HIGH (ref 70–99)
Potassium: 4.8 mmol/L (ref 3.5–5.1)
Sodium: 127 mmol/L — ABNORMAL LOW (ref 135–145)

## 2018-10-04 LAB — TROPONIN I: Troponin I: <0.03 ng/mL (ref <0.03)

## 2018-10-04 MED ORDER — SODIUM CHLORIDE 0.9% FLUSH: 3.0000 mL | Freq: Once | INTRAVENOUS | Status: DC

## 2018-10-04 MED ORDER — SODIUM CHLORIDE 0.9 % IV SOLN
1000.0000 mL | Freq: Once | INTRAVENOUS | Status: AC
  Administered 2018-10-04: 1000 mL via INTRAVENOUS

## 2018-10-04 NOTE — ED Triage Notes (Signed)
Pt was seen by PCP in [facility] clinic for what she describes as "anxiety".  Pt tells me that she is anxious and has CP and nausea and dizziness which has been going on since she woke up this am.  Pt did not have this dizziness when she went to bed at 10pm last night. Pt states that she has been having some difficulty with speech since yesterday and it is worse today and it is making her more anxious.  Pt is calm and cooperative in triage. No focal weakness, no problems with speech noted and no visual loss or change.

## 2018-10-04 NOTE — ED Provider Notes (Signed)
[facility] Emergency Department Provider Note   ____________________________________________    I have reviewed the triage vital signs and the nursing notes.   HISTORY  Chief Complaint Anxiety and Dizziness     HPI Jodi Parrish is a 73 y.o. female who presents with complaints of dizziness as well as chronic anxiety.  Patient's primary complaint is that this morning when getting up from a chair she had a sensation that she was off balance and that the room was spinning.  She reports a history of similar episodes of vertigo.  She denies neuro deficits.  No headaches.  No nausea or vomiting.  Currently her symptoms have resolved.  Also notes that she has a long history of chronic anxiety which has become somewhat debilitating for her recently, she has stopped going to her psychiatrist 6 to 8 months ago.   Past Medical History:  Diagnosis Date  . Ankle fracture    right  . Anxiety   . Arthritis   . Bipolar disorder (HCC)   . Depression   . Diverticulitis   . Hyperlipidemia   . Hypertension   . Osteoarthritis   . Overactive bladder     Patient Active Problem List   Diagnosis Date Noted  . Primary osteoarthritis of right hip 01/10/2017    Past Surgical History:  Procedure Laterality Date  . ABDOMINAL HYSTERECTOMY  1998  . BARTHOLIN GLAND CYST EXCISION  1976  . COLONOSCOPY    . COLONOSCOPY WITH PROPOFOL N/A 04/10/2018   Procedure: COLONOSCOPY WITH PROPOFOL;  Surgeon: Toledo, Boykin Nearingeodoro K, MD;  Location: [facility] ENDOSCOPY;  Service: Gastroenterology;  Laterality: N/A;  . ELBOW SURGERY Right 1995   crushed elbow with hardware implanted  . ESOPHAGOGASTRODUODENOSCOPY (EGD) WITH PROPOFOL N/A 04/10/2018   Procedure: ESOPHAGOGASTRODUODENOSCOPY (EGD) WITH PROPOFOL;  Surgeon: Toledo, Boykin Nearingeodoro K, MD;  Location: [facility] ENDOSCOPY;  Service: Gastroenterology;  Laterality: N/A;  . JOINT REPLACEMENT    . KNEE SURGERY Left 2000  . PELVIC FLOOR REPAIR  2009   PELVIC FLOOR LIFT  . REPLACEMENT TOTAL KNEE Left 2016  . TONSILLECTOMY  1958  . TOTAL HIP ARTHROPLASTY Left 2008  . TOTAL HIP ARTHROPLASTY Right 01/10/2017   Procedure: TOTAL HIP ARTHROPLASTY ANTERIOR APPROACH;  Surgeon: Kennedy BuckerMichael Menz, MD;  Location: [facility] ORS;  Service: Orthopedics;  Laterality: Right;    Prior to Admission medications   Medication Sig Start Date End Date Taking? Authorizing Provider  budesonide-formoterol (SYMBICORT) 80-4.5 MCG/ACT inhaler Inhale 2 puffs into the lungs 2 (two) times daily.   Yes [provider]  Calcium Carbonate-Vitamin D (CALCIUM 500 + D PO) Take 1 tablet by mouth daily.   Yes [provider]  citalopram (CELEXA) 20 MG tablet Take 20 mg by mouth daily.   Yes [provider]  estradiol (ESTRACE VAGINAL) 0.1 MG/GM vaginal cream Apply 0.5mg  (pea-sized amount)  just inside the vaginal introitus with a finger-tip on Monday, Wednesday and Friday nights. 09/06/17  Yes McGowan, Carollee HerterShannon A, PA-C  lamoTRIgine (LAMICTAL) 200 MG tablet Take 200 mg by mouth at bedtime.   Yes [provider]  lisinopril (PRINIVIL,ZESTRIL) 10 MG tablet Take 10 mg by mouth daily.   Yes [provider]  olmesartan-hydrochlorothiazide (BENICAR HCT) 40-12.5 MG tablet Take 1 tablet by mouth daily.   Yes [provider]  pravastatin (PRAVACHOL) 40 MG tablet Take 40 mg by mouth at bedtime.   Yes [provider]  propranolol ER (INDERAL LA) 60 MG 24 hr capsule Take  60 mg by mouth daily.   Yes [provider]  spironolactone (ALDACTONE) 100 MG tablet Take 100 mg by mouth at bedtime.   Yes [provider]  traZODone (DESYREL) 100 MG tablet Take 200 mg by mouth at bedtime.   Yes [provider]  vitamin B-12 (CYANOCOBALAMIN) 1000 MCG tablet Take 1,000 mcg by mouth daily.   Yes [provider]  amphetamine-dextroamphetamine (ADDERALL) 20 MG tablet Take 10 mg by mouth daily.     [provider]    Cholecalciferol (VITAMIN D-3) 5000 units TABS Take 1 tablet by mouth daily.    [provider]  clotrimazole-betamethasone (LOTRISONE) cream Apply 1 application topically 2 (two) times daily. For 2 wks Patient not taking: Reported on 10/04/2018 12/14/17   Copland, Helmut Muster B, PA-C  docusate sodium (COLACE) 100 MG capsule Take 1 capsule (100 mg total) by mouth 2 (two) times daily. Patient not taking: Reported on 10/04/2018 01/12/17   Evon Slack, PA-C  enoxaparin (LOVENOX) 40 MG/0.4ML injection Inject 0.4 mLs (40 mg total) into the skin daily. 01/12/17 01/26/17  Evon Slack, PA-C  estradiol (ESTRACE VAGINAL) 0.1 MG/GM vaginal cream Apply 0.5mg  (pea-sized amount)  just inside the vaginal introitus with a finger-tip on then Monday, Wednesday and Friday nights. 12/06/17   Michiel Cowboy A, PA-C  estradiol (ESTRACE) 1 MG tablet Take 1 mg by mouth 2 (two) times a week.    [provider]  ferrous sulfate 325 (65 FE) MG tablet Take 325 mg by mouth daily with breakfast.    [provider]  folic acid (FOLVITE) 800 MCG tablet Take 800 mcg by mouth daily.    [provider]  Magnesium 250 MG TABS Take 1 tablet by mouth daily.    [provider]  meloxicam (MOBIC) 15 MG tablet Take 15 mg by mouth daily.    [provider]  Multiple Vitamins-Minerals (MULTIVITAMIN WITH MINERALS) tablet Take 1 tablet by mouth daily.    [provider]  neomycin-polymyxin-dexameth (MAXITROL) 0.1 % OINT 1 application.    [provider]  Omega-3 Fatty Acids (FISH OIL) 1000 MG CAPS Take 1 capsule by mouth daily.    [provider]  Probiotic Product (PROBIOTIC DAILY PO) Take 1 capsule by mouth daily.    [provider]  solifenacin (VESICARE) 5 MG tablet Take 1 tablet (5 mg total) by mouth daily. Patient not taking: Reported on 10/04/2018 12/06/17   Michiel Cowboy A, PA-C  temazepam (RESTORIL) 15 MG capsule Take 15 mg by mouth at  bedtime.    [provider]     Allergies Abilify [aripiprazole] and Duloxetine  Family History  Problem Relation Age of Onset  . Parkinson's disease Mother   . CAD Father   . Hypertension Father   . Breast cancer Neg Hx     Social History Social History   Tobacco Use  . Smoking status: Former Smoker    Types: Cigarettes  . Smokeless tobacco: Never Used  Substance Use Topics  . Alcohol use: No  . Drug use: No    Review of Systems  Constitutional: No fever/chills Eyes: No visual changes.  ENT: No sore throat. Cardiovascular: Denies chest pain. Respiratory: Denies shortness of breath. Gastrointestinal: No abdominal pain.     Genitourinary: Negative for dysuria. Musculoskeletal: Negative for back pain. Skin: Negative for rash. Neurological: As above   ____________________________________________   PHYSICAL EXAM:  VITAL SIGNS: ED Triage Vitals [10/04/18 1007]  Enc Vitals Group  BP 124/80     Pulse Rate 70     Resp 16     Temp 98.2 F (36.8 C)     Temp Source Oral     SpO2 99 %     Weight 78 kg (172 lb)     Height 1.727 m (5\' 8" )     Head Circumference      Peak Flow      Pain Score 0     Pain Loc      Pain Edu?      Excl. in GC?     Constitutional: Alert and oriented. No acute distress. Pleasant and interactive Eyes: Conjunctivae are normal.  Rightward nystagmus  Nose: No congestion/rhinnorhea. Mouth/Throat: Mucous membranes are moist.    Cardiovascular: Normal rate, regular rhythm. Grossly normal heart sounds.  Good peripheral circulation. Respiratory: Normal respiratory effort.  No retractions.  Gastrointestinal: Soft and nontender. No distention.   Musculoskeletal: No lower extremity tenderness nor edema.  Warm and well perfused Neurologic:  Normal speech and language. No gross focal neurologic deficits are appreciated.  Cranial nerves II through XII are normal Skin:  Skin is warm, dry and intact. No rash noted. Psychiatric:  Mood and affect are normal. Speech and behavior are normal.  ____________________________________________   LABS (all labs ordered are listed, but only abnormal results are displayed)  Labs Reviewed  BASIC METABOLIC PANEL - Abnormal; Notable for the following components:      Result Value   Sodium 127 (*)    Chloride 92 (*)    Glucose, Bld 113 (*)    BUN 26 (*)    Creatinine, Ser 1.60 (*)    GFR calc non Af Amer 32 (*)    GFR calc Af Amer 37 (*)    All other components within normal limits  CBC - Abnormal; Notable for the following components:   RBC 3.73 (*)    Hemoglobin 11.8 (*)    HCT 35.6 (*)    All other components within normal limits  TROPONIN I   ____________________________________________  EKG ED ECG REPORT I, Jene Every, the attending physician, personally viewed and interpreted this ECG.  Date: 10/04/2018  Rhythm: normal sinus rhythm QRS Axis: normal Intervals: normal ST/T Wave abnormalities: normal Narrative Interpretation: no evidence of acute ischemia   ____________________________________________  RADIOLOGY  CT head unremarkable ____________________________________________   PROCEDURES  Procedure(s) performed: No  Procedures   Critical Care performed: No ____________________________________________   INITIAL IMPRESSION / ASSESSMENT AND PLAN / ED COURSE  Pertinent labs & imaging results that were available during my care of the patient were reviewed by me and considered in my medical decision making (see chart for details).  Patient's symptoms appear to be related with vertigo, likely BPV, no concerning symptoms such as ataxia neuro deficits, headache.  Does have some nystagmus on exam as well.  Lab work notable for sodium of 127, she has a history of hyponatremia and this is not far off her baseline.  She is dehydrated based on her labs this was treated with a liter of IV fluids.  Recommend outpatient follow-up with PCP.  Strongly  recommended follow-up with psychiatry given her complaints of debilitating anxiety    ____________________________________________   FINAL CLINICAL IMPRESSION(S) / ED DIAGNOSES  Final diagnoses:  Vertigo        Note:  This document was prepared using Dragon voice recognition software and may include unintentional dictation errors.   Jene Every, MD 10/04/18 [phone]

## 2018-11-05 ENCOUNTER — Ambulatory Visit: Payer: Self-pay | Admitting: Psychiatry

## 2018-12-03 ENCOUNTER — Ambulatory Visit: Payer: Medicare Other | Admitting: Psychiatry

## 2018-12-03 NOTE — Progress Notes (Incomplete)
12/05/2018 7:49 PM   Jodi Parrish April 21, 1946 [id number]  Referring provider: Lauro Regulus, MD [address] [facility] [place] [facility] [place], [place] [address]  No chief complaint on file.   HPI: Jodi Parrish is a 72 y.o. female Caucasian with mixed urinary incontinence and vaginal atrophy who presents today for an annual follow-up.  Background history Patient is a 57 -year-old Caucasian female who is referred to [place] by, Dr. Dareen Piano, for urinary incontinence.  Patient states that she has had urinary incontinence for two years.  She underwent a pelvic floor lift in 2009.  Patient has incontinence with stress.   She is experiencing 2 to 3 incontinent episodes during the day. She is experiencing 2 to 3 incontinent episodes during the night.  Her incontinence volume is large sometimes going through pads and clothes.  She is wearing two medium pads during the day and night time.  She does not have a history of urinary tract infections, STI's or injury to the bladder.  She is having associated urinary frequency, urgency and nocturia.  Her PVR is 24 mL.   She denies dysuria, gross hematuria, suprapubic pain, back pain, abdominal pain or flank pain.  She has not had any recent fevers, chills, nausea or vomiting.   She does not have a history of nephrolithiasis, GU surgery or GU trauma.  She is  sexually active.  She has not noted incontinence with sexual intercourse.  She is post menopausal.   She admits to constipation.  She sometimes has to DAD.  She has not had any recent imaging studies.  She is not drinking a lot of water daily.   She is drinking 2 to 3 caffeinated beverages daily.  She is not drinking alcoholic beverages daily.   One glass a week of soda.  Her risk factors for incontinence are obesity, vaginal delivery, a family history of incontinence, age, caffeine, depression, vaginal atrophy, oral estrogens and pelvic surgery.  She is taking diuretics, antidepressants,  antipsychotics and/or oral estrogen.  She had been on Vesicare and Estrace cream in the past with good results from her urologist, Dr. Gwyneth Sprout in [facility].    On 12/06/2017, the patient had been experiencing urgency x 0-3, frequency x 4-7, not restricting fluids to avoid visits to the restroom, not engaging in toilet mapping, incontinence x 0-3 and nocturia x 0-3.   Her PVR was 118 mL.  Patient denied any gross hematuria, dysuria or suprapubic/flank pain.  Patient denied any fevers, chills, nausea or vomiting.  She had noticed improvement in her frequency and urgency, she stated she seldom has leakage of urine and she had nocturia x1.  She has been using the vaginal estrogen cream three nights weekly.      Today (12/05/2018), the patient is experiencing urgency x *** (***), frequency x *** (***), not/is restricting fluids to avoid visits to the restroom ***, not/is engaging in toilet mapping, incontinence x *** (***) and nocturia x *** (***).   Her BP is ***.   Her PVR is ***.     ***  PMH: Past Medical History:  Diagnosis Date   Ankle fracture    right   Anxiety    Arthritis    Bipolar disorder (HCC)    Depression    Diverticulitis    Hyperlipidemia    Hypertension    Osteoarthritis    Overactive bladder     Surgical History: Past Surgical History:  Procedure Laterality Date  ABDOMINAL HYSTERECTOMY  1998   BARTHOLIN GLAND CYST EXCISION  1976   COLONOSCOPY     COLONOSCOPY WITH PROPOFOL N/A 04/10/2018   Procedure: COLONOSCOPY WITH PROPOFOL;  Surgeon: Toledo, Boykin Nearing, MD;  Location: [facility];  Service: Gastroenterology;  Laterality: N/A;   ELBOW SURGERY Right 1995   crushed elbow with hardware implanted   ESOPHAGOGASTRODUODENOSCOPY (EGD) WITH PROPOFOL N/A 04/10/2018   Procedure: ESOPHAGOGASTRODUODENOSCOPY (EGD) WITH PROPOFOL;  Surgeon: Toledo, Boykin Nearing, MD;  Location: [facility] ENDOSCOPY;  Service: Gastroenterology;  Laterality: N/A;   JOINT REPLACEMENT     KNEE  SURGERY Left 2000   PELVIC FLOOR REPAIR  2009   PELVIC FLOOR LIFT   REPLACEMENT TOTAL KNEE Left 2016   TONSILLECTOMY  1958   TOTAL HIP ARTHROPLASTY Left 2008   TOTAL HIP ARTHROPLASTY Right 01/10/2017   Procedure: TOTAL HIP ARTHROPLASTY ANTERIOR APPROACH;  Surgeon: Kennedy Bucker, MD;  Location: [facility] ORS;  Service: Orthopedics;  Laterality: Right;    Home Medications:  Allergies as of 12/05/2018      Reactions   Abilify [aripiprazole] Anxiety   SEVERE HYPERACTIVITY   Duloxetine Anxiety      Medication List       Accurate as of December 03, 2018  7:49 PM. Always use your most recent med list.        amphetamine-dextroamphetamine 20 MG tablet Commonly known as:  ADDERALL Take 10 mg by mouth daily.   budesonide-formoterol 80-4.5 MCG/ACT inhaler Commonly known as:  SYMBICORT Inhale 2 puffs into the lungs 2 (two) times daily.   CALCIUM 500 + D PO Take 1 tablet by mouth daily.   citalopram 20 MG tablet Commonly known as:  CELEXA Take 20 mg by mouth daily.   clotrimazole-betamethasone cream Commonly known as:  LOTRISONE Apply 1 application topically 2 (two) times daily. For 2 wks   docusate sodium 100 MG capsule Commonly known as:  COLACE Take 1 capsule (100 mg total) by mouth 2 (two) times daily.   enoxaparin 40 MG/0.4ML injection Commonly known as:  LOVENOX Inject 0.4 mLs (40 mg total) into the skin daily.   estradiol 0.1 MG/GM vaginal cream Commonly known as:  ESTRACE VAGINAL Apply 0.5mg  (pea-sized amount)  just inside the vaginal introitus with a finger-tip on Monday, Wednesday and Friday nights.   estradiol 0.1 MG/GM vaginal cream Commonly known as:  ESTRACE VAGINAL Apply 0.5mg  (pea-sized amount)  just inside the vaginal introitus with a finger-tip on then Monday, Wednesday and Friday nights.   estradiol 1 MG tablet Commonly known as:  ESTRACE Take 1 mg by mouth 2 (two) times a week.   ferrous sulfate 325 (65 FE) MG tablet Take 325 mg by mouth daily with  breakfast.   Fish Oil 1000 MG Caps Take 1 capsule by mouth daily.   folic acid 800 MCG tablet Commonly known as:  FOLVITE Take 800 mcg by mouth daily.   lamoTRIgine 200 MG tablet Commonly known as:  LAMICTAL Take 200 mg by mouth at bedtime.   lisinopril 10 MG tablet Commonly known as:  PRINIVIL,ZESTRIL Take 10 mg by mouth daily.   Magnesium 250 MG Tabs Take 1 tablet by mouth daily.   meloxicam 15 MG tablet Commonly known as:  MOBIC Take 15 mg by mouth daily.   multivitamin with minerals tablet Take 1 tablet by mouth daily.   neomycin-polymyxin-dexameth 0.1 % Oint Commonly known as:  MAXITROL 1 application.   olmesartan-hydrochlorothiazide 40-12.5 MG tablet Commonly known as:  BENICAR HCT Take 1 tablet by  mouth daily.   pravastatin 40 MG tablet Commonly known as:  PRAVACHOL Take 40 mg by mouth at bedtime.   PROBIOTIC DAILY PO Take 1 capsule by mouth daily.   propranolol ER 60 MG 24 hr capsule Commonly known as:  INDERAL LA Take 60 mg by mouth daily.   solifenacin 5 MG tablet Commonly known as:  VESIcare Take 1 tablet (5 mg total) by mouth daily.   spironolactone 100 MG tablet Commonly known as:  ALDACTONE Take 100 mg by mouth at bedtime.   temazepam 15 MG capsule Commonly known as:  RESTORIL Take 15 mg by mouth at bedtime.   traZODone 100 MG tablet Commonly known as:  DESYREL Take 200 mg by mouth at bedtime.   vitamin B-12 1000 MCG tablet Commonly known as:  CYANOCOBALAMIN Take 1,000 mcg by mouth daily.   Vitamin D-3 125 MCG (5000 UT) Tabs Take 1 tablet by mouth daily.       Allergies:  Allergies  Allergen Reactions   Abilify [Aripiprazole] Anxiety    SEVERE HYPERACTIVITY   Duloxetine Anxiety    Family History: Family History  Problem Relation Age of Onset   Parkinson's disease Mother    CAD Father    Hypertension Father    Breast cancer Neg Hx     Social History:  reports that she has quit smoking. Her smoking use included  cigarettes. She has never used smokeless tobacco. She reports that she does not drink alcohol or use drugs.  ROS:                                        Physical Exam: There were no vitals taken for this visit.  Constitutional: Well nourished. Alert and oriented, No acute distress. {HEENT: [place] AT, moist mucus membranes.  Trachea midline, no masses.} Cardiovascular: No clubbing, cyanosis, or edema. Respiratory: Normal respiratory effort, no increased work of breathing. {GI: Abdomen is soft, non tender, non distended, no abdominal masses. Liver and spleen not palpable.  No hernias appreciated.  Stool sample for occult testing is not indicated.} {GU: No CVA tenderness.  No bladder fullness or masses.  *** external genitalia, *** pubic hair distribution, no lesions.  Normal urethral meatus, no lesions, no prolapse, no discharge.   No urethral masses, tenderness and/or tenderness. No bladder fullness, tenderness or masses. *** vagina mucosa, *** estrogen effect, no discharge, no lesions, *** pelvic support, *** cystocele and *** rectocele noted.  No cervical motion tenderness.  Uterus is freely mobile and non-fixed.  No adnexal/parametria masses or tenderness noted.  Anus and perineum are without rashes or lesions.   *** } Skin: No rashes, bruises or suspicious lesions. {Lymph: No cervical or inguinal adenopathy.} Neurologic: Grossly intact, no focal deficits, moving all 4 extremities. Psychiatric: Normal mood and affect.   Laboratory Data: Lab Results  Component Value Date   WBC 8.5 10/04/2018   HGB 11.8 (L) 10/04/2018   HCT 35.6 (L) 10/04/2018   MCV 95.4 10/04/2018   PLT 291 10/04/2018    Lab Results  Component Value Date   CREATININE 1.60 (H) 10/04/2018    Urinalysis    Component Value Date/Time   COLORURINE YELLOW (A) 01/04/2017 0915   APPEARANCEUR CLEAR (A) 01/04/2017 0915   APPEARANCEUR Clear 09/18/2013 1454   LABSPEC 1.005 01/04/2017 0915   LABSPEC  1.004 09/18/2013 1454   PHURINE 7.0 01/04/2017 0915   GLUCOSEU NEGATIVE  01/04/2017 0915   GLUCOSEU Negative 09/18/2013 1454   HGBUR NEGATIVE 01/04/2017 0915   BILIRUBINUR NEGATIVE 01/04/2017 0915   BILIRUBINUR Negative 09/18/2013 1454   KETONESUR NEGATIVE 01/04/2017 0915   PROTEINUR NEGATIVE 01/04/2017 0915   NITRITE NEGATIVE 01/04/2017 0915   LEUKOCYTESUR NEGATIVE 01/04/2017 0915   LEUKOCYTESUR Negative 09/18/2013 1454    I have reviewed the labs.   Pertinent Imaging: Results for DE MELANIA, KIRKS (MRN [id number]) as of 12/28/2017 09:13  Ref. Range 12/06/2017 13:50  Scan Result Unknown    Assessment & Plan:    1. Vaginal lesions *** - Refer to gynecology  2. Urge incontinence *** - Patient found the Vesicare 5 mg daily very helpful and controlling her symptoms *** - RTC in one year for OAB questionnaire, PVR and exam  3. Vaginal atrophy *** - Continue the Estrace cream three nights weekly *** - RTC in 12 months for exam  No follow-ups on file.  Michiel Cowboy, PA-C  [facility] [address] [place], [place] [address] [phone]  I, Duanne Moron, am acting as a Neurosurgeon for [employer], PA-C.   {Add Scribe Attestation Statement}

## 2018-12-05 ENCOUNTER — Encounter: Payer: Self-pay | Admitting: Urology

## 2018-12-05 ENCOUNTER — Ambulatory Visit: Payer: Medicare Other | Admitting: Urology

## 2019-04-04 DIAGNOSIS — F419 Anxiety disorder, unspecified: Secondary | ICD-10-CM | POA: Insufficient documentation

## 2019-06-10 DIAGNOSIS — M19012 Primary osteoarthritis, left shoulder: Secondary | ICD-10-CM | POA: Insufficient documentation

## 2019-09-04 ENCOUNTER — Telehealth: Payer: Self-pay | Admitting: *Deleted

## 2019-09-04 ENCOUNTER — Other Ambulatory Visit: Payer: Self-pay | Admitting: Pulmonary Disease

## 2019-09-04 DIAGNOSIS — R590 Localized enlarged lymph nodes: Secondary | ICD-10-CM

## 2019-09-09 ENCOUNTER — Other Ambulatory Visit: Payer: Self-pay | Admitting: Orthopedic Surgery

## 2019-09-09 DIAGNOSIS — T84033A Mechanical loosening of internal left knee prosthetic joint, initial encounter: Secondary | ICD-10-CM

## 2019-09-09 DIAGNOSIS — T8484XA Pain due to internal orthopedic prosthetic devices, implants and grafts, initial encounter: Secondary | ICD-10-CM

## 2019-09-17 ENCOUNTER — Encounter
Admission: RE | Admit: 2019-09-17 | Discharge: 2019-09-17 | Disposition: A | Discharge status: Home / Self Care | Payer: Medicare Other | Source: Ambulatory Visit | Attending: Orthopedic Surgery | Admitting: Orthopedic Surgery

## 2019-09-17 ENCOUNTER — Other Ambulatory Visit: Payer: Self-pay

## 2019-09-17 DIAGNOSIS — T84033A Mechanical loosening of internal left knee prosthetic joint, initial encounter: Secondary | ICD-10-CM

## 2019-09-17 DIAGNOSIS — T8484XA Pain due to internal orthopedic prosthetic devices, implants and grafts, initial encounter: Secondary | ICD-10-CM | POA: Insufficient documentation

## 2019-09-17 MED ORDER — TECHNETIUM TC 99M MEDRONATE IV KIT
22.0900 | PACK | Freq: Once | INTRAVENOUS | Status: AC | PRN
  Administered 2019-09-17: 22.09 via INTRAVENOUS

## 2019-09-23 ENCOUNTER — Ambulatory Visit
Admission: RE | Admit: 2019-09-23 | Discharge: 2019-09-23 | Disposition: A | Discharge status: Home / Self Care | Payer: Medicare Other | Source: Ambulatory Visit | Attending: Pulmonary Disease | Admitting: Pulmonary Disease

## 2019-09-23 ENCOUNTER — Other Ambulatory Visit: Payer: Self-pay

## 2019-09-23 DIAGNOSIS — R590 Localized enlarged lymph nodes: Secondary | ICD-10-CM

## 2019-09-23 MED ORDER — IOHEXOL 300 MG/ML  SOLN
75.0000 mL | Freq: Once | INTRAMUSCULAR | Status: AC | PRN
  Administered 2019-09-23: 09:00:00 75 mL via INTRAVENOUS

## 2019-10-08 DIAGNOSIS — R0789 Other chest pain: Secondary | ICD-10-CM | POA: Insufficient documentation

## 2019-12-18 ENCOUNTER — Other Ambulatory Visit: Payer: Self-pay

## 2019-12-18 ENCOUNTER — Emergency Department: Payer: Medicare Other

## 2019-12-18 ENCOUNTER — Emergency Department
Admission: EM | Admit: 2019-12-18 | Discharge: 2019-12-18 | Disposition: A | Discharge status: Home / Self Care | Payer: Medicare Other | Attending: Student in an Organized Health Care Education/Training Program | Admitting: Student in an Organized Health Care Education/Training Program

## 2019-12-18 DIAGNOSIS — Y939 Activity, unspecified: Secondary | ICD-10-CM | POA: Insufficient documentation

## 2019-12-18 DIAGNOSIS — G8929 Other chronic pain: Secondary | ICD-10-CM | POA: Insufficient documentation

## 2019-12-18 DIAGNOSIS — I10 Essential (primary) hypertension: Secondary | ICD-10-CM | POA: Insufficient documentation

## 2019-12-18 DIAGNOSIS — M549 Dorsalgia, unspecified: Secondary | ICD-10-CM | POA: Insufficient documentation

## 2019-12-18 DIAGNOSIS — F419 Anxiety disorder, unspecified: Secondary | ICD-10-CM | POA: Diagnosis not present

## 2019-12-18 DIAGNOSIS — Z79899 Other long term (current) drug therapy: Secondary | ICD-10-CM | POA: Diagnosis not present

## 2019-12-18 DIAGNOSIS — M25512 Pain in left shoulder: Secondary | ICD-10-CM | POA: Diagnosis present

## 2019-12-18 DIAGNOSIS — W19XXXA Unspecified fall, initial encounter: Secondary | ICD-10-CM | POA: Insufficient documentation

## 2019-12-18 DIAGNOSIS — M542 Cervicalgia: Secondary | ICD-10-CM | POA: Insufficient documentation

## 2019-12-18 DIAGNOSIS — Z87891 Personal history of nicotine dependence: Secondary | ICD-10-CM | POA: Insufficient documentation

## 2019-12-18 DIAGNOSIS — M25511 Pain in right shoulder: Secondary | ICD-10-CM | POA: Diagnosis not present

## 2019-12-18 DIAGNOSIS — Z96643 Presence of artificial hip joint, bilateral: Secondary | ICD-10-CM | POA: Diagnosis not present

## 2019-12-18 DIAGNOSIS — Y929 Unspecified place or not applicable: Secondary | ICD-10-CM | POA: Insufficient documentation

## 2019-12-18 DIAGNOSIS — M79603 Pain in arm, unspecified: Secondary | ICD-10-CM

## 2019-12-18 DIAGNOSIS — Y999 Unspecified external cause status: Secondary | ICD-10-CM | POA: Diagnosis not present

## 2019-12-18 DIAGNOSIS — Z96652 Presence of left artificial knee joint: Secondary | ICD-10-CM | POA: Diagnosis not present

## 2019-12-18 DIAGNOSIS — Z7901 Long term (current) use of anticoagulants: Secondary | ICD-10-CM | POA: Insufficient documentation

## 2019-12-18 DIAGNOSIS — R410 Disorientation, unspecified: Secondary | ICD-10-CM | POA: Diagnosis not present

## 2019-12-18 LAB — CBC WITH DIFFERENTIAL/PLATELET
Abs Immature Granulocytes: 0.07 10*3/uL (ref 0.00–0.07)
Basophils Absolute: 0.1 10*3/uL (ref 0.0–0.1)
Basophils Relative: 0 %
Eosinophils Absolute: 0.2 10*3/uL (ref 0.0–0.5)
Eosinophils Relative: 1 %
HCT: 40.2 % (ref 36.0–46.0)
Hemoglobin: 13.5 g/dL (ref 12.0–15.0)
Immature Granulocytes: 1 %
Lymphocytes Relative: 17 %
Lymphs Abs: 2.2 10*3/uL (ref 0.7–4.0)
MCH: 32.1 pg (ref 26.0–34.0)
MCHC: 33.6 g/dL (ref 30.0–36.0)
MCV: 95.7 fL (ref 80.0–100.0)
Monocytes Absolute: 0.8 10*3/uL (ref 0.1–1.0)
Monocytes Relative: 6 %
Neutro Abs: 9.9 10*3/uL — ABNORMAL HIGH (ref 1.7–7.7)
Neutrophils Relative %: 75 %
Platelets: 224 10*3/uL (ref 150–400)
RBC: 4.2 MIL/uL (ref 3.87–5.11)
RDW: 12.5 % (ref 11.5–15.5)
WBC: 13.3 10*3/uL — ABNORMAL HIGH (ref 4.0–10.5)
nRBC: 0 % (ref 0.0–0.2)

## 2019-12-18 LAB — BASIC METABOLIC PANEL
Anion gap: 7 (ref 5–15)
BUN: 16 mg/dL (ref 8–23)
CO2: 31 mmol/L (ref 22–32)
Calcium: 9.9 mg/dL (ref 8.9–10.3)
Chloride: 100 mmol/L (ref 98–111)
Creatinine, Ser: 0.92 mg/dL (ref 0.44–1.00)
GFR calc Af Amer: >60 mL/min (ref >60)
GFR calc non Af Amer: >60 mL/min (ref >60)
Glucose, Bld: 97 mg/dL (ref 70–99)
Potassium: 3.4 mmol/L — ABNORMAL LOW (ref 3.5–5.1)
Sodium: 138 mmol/L (ref 135–145)

## 2019-12-18 LAB — URINALYSIS, COMPLETE (UACMP) WITH MICROSCOPIC
Bilirubin Urine: NEGATIVE
Glucose, UA: NEGATIVE mg/dL
Ketones, ur: NEGATIVE mg/dL
Leukocytes,Ua: NEGATIVE
Nitrite: NEGATIVE
Protein, ur: NEGATIVE mg/dL
Specific Gravity, Urine: 1.011 (ref 1.005–1.030)
pH: 9 — ABNORMAL HIGH (ref 5.0–8.0)

## 2019-12-18 LAB — TROPONIN I (HIGH SENSITIVITY): Troponin I (High Sensitivity): 3 ng/L (ref <18)

## 2019-12-18 LAB — CK: Total CK: 44 U/L (ref 38–234)

## 2019-12-18 MED ORDER — MORPHINE SULFATE (PF) 4 MG/ML IV SOLN
4.0000 mg | Freq: Once | INTRAVENOUS | Status: AC
  Administered 2019-12-18: 4 mg via INTRAVENOUS
  Filled 2019-12-18: qty 1

## 2019-12-18 MED ORDER — LORAZEPAM 2 MG/ML IJ SOLN
0.5000 mg | Freq: Once | INTRAMUSCULAR | Status: AC
  Administered 2019-12-18: 0.5 mg via INTRAVENOUS
  Filled 2019-12-18: qty 1

## 2019-12-18 MED ORDER — ONDANSETRON HCL 4 MG/2ML IJ SOLN
4.0000 mg | Freq: Once | INTRAMUSCULAR | Status: AC
  Administered 2019-12-18: 4 mg via INTRAVENOUS
  Filled 2019-12-18: qty 2

## 2019-12-18 MED ORDER — ALPRAZOLAM 0.5 MG PO TABS: 0.5000 mg | ORAL_TABLET | Freq: Two times a day (BID) | ORAL | 0 refills | Status: DC | PRN

## 2019-12-18 NOTE — ED Provider Notes (Signed)
[facility] Emergency Department Provider Note  ____________________________________________   First MD Initiated Contact with Patient 12/18/19 2050     (approximate)  I have reviewed the triage vital signs and the nursing notes.   HISTORY  Chief Complaint Shoulder Pain    HPI Jodi Parrish is a 74 y.o. female presents emergency department complaining of bilateral arm and upper back pain.  Patient did fall 2 days ago complained of left shoulder pain.  States the pain is a stinging nerve type pain.  Patient had her Covid vaccine 2 weeks ago.  Steroid injections about a week prior to that.  No LOC during the fall.   Patient does have a history of arthritis.  Had bilateral shoulder injections in December/2020.  Patient is a poor historian.  Her husband is trying to help with the history but is also a poor historian   Past Medical History:  Diagnosis Date  . Ankle fracture    right  . Anxiety   . Arthritis   . Bipolar disorder ([place])   . Depression   . Diverticulitis   . Hyperlipidemia   . Hypertension   . Osteoarthritis   . Overactive bladder     Patient Active Problem List   Diagnosis Date Noted  . Primary osteoarthritis of right hip 01/10/2017    Past Surgical History:  Procedure Laterality Date  . ABDOMINAL HYSTERECTOMY  1998  . BARTHOLIN GLAND CYST EXCISION  1976  . COLONOSCOPY    . COLONOSCOPY WITH PROPOFOL N/A 04/10/2018   Procedure: COLONOSCOPY WITH PROPOFOL;  Surgeon: Toledo, Benay Pike, MD;  Location: [facility] ENDOSCOPY;  Service: Gastroenterology;  Laterality: N/A;  . ELBOW SURGERY Right 1995   crushed elbow with hardware implanted  . ESOPHAGOGASTRODUODENOSCOPY (EGD) WITH PROPOFOL N/A 04/10/2018   Procedure: ESOPHAGOGASTRODUODENOSCOPY (EGD) WITH PROPOFOL;  Surgeon: Toledo, Benay Pike, MD;  Location: [facility] ENDOSCOPY;  Service: Gastroenterology;  Laterality: N/A;  . JOINT REPLACEMENT    . KNEE SURGERY Left 2000  . PELVIC FLOOR REPAIR  2009     PELVIC FLOOR LIFT  . REPLACEMENT TOTAL KNEE Left 2016  . TONSILLECTOMY  1958  . TOTAL HIP ARTHROPLASTY Left 2008  . TOTAL HIP ARTHROPLASTY Right 01/10/2017   Procedure: TOTAL HIP ARTHROPLASTY ANTERIOR APPROACH;  Surgeon: Hessie Knows, MD;  Location: [facility] ORS;  Service: Orthopedics;  Laterality: Right;    Prior to Admission medications   Medication Sig Start Date End Date Taking? Authorizing Provider  ALPRAZolam Duanne Moron) 0.5 MG tablet Take 1 tablet (0.5 mg total) by mouth 2 (two) times daily as needed for anxiety or sleep. 12/18/19 12/17/20  Keyanni Whittinghill, Linden Dolin, PA-C  amphetamine-dextroamphetamine (ADDERALL) 20 MG tablet Take 10 mg by mouth daily.     [provider]  budesonide-formoterol (SYMBICORT) 80-4.5 MCG/ACT inhaler Inhale 2 puffs into the lungs 2 (two) times daily.    [provider]  Calcium Carbonate-Vitamin D (CALCIUM 500 + D PO) Take 1 tablet by mouth daily.    [provider]  Cholecalciferol (VITAMIN D-3) 5000 units TABS Take 1 tablet by mouth daily.    [provider]  citalopram (CELEXA) 20 MG tablet Take 20 mg by mouth daily.    [provider]  clotrimazole-betamethasone (LOTRISONE) cream Apply 1 application topically 2 (two) times daily. For 2 wks Patient not taking: Reported on 10/04/2018 1/61/09   Copland, Elmo Putt B, PA-C  docusate sodium (COLACE) 100 MG capsule Take 1 capsule (100 mg total) by mouth 2 (two) times  daily. Patient not taking: Reported on 10/04/2018 01/12/17   Evon Slack, PA-C  enoxaparin (LOVENOX) 40 MG/0.4ML injection Inject 0.4 mLs (40 mg total) into the skin daily. 01/12/17 01/26/17  Evon Slack, PA-C  estradiol (ESTRACE VAGINAL) 0.1 MG/GM vaginal cream Apply 0.5mg  (pea-sized amount)  just inside the vaginal introitus with a finger-tip on Monday, Wednesday and Friday nights. 09/06/17   Michiel Cowboy A, PA-C  estradiol (ESTRACE VAGINAL) 0.1 MG/GM vaginal cream Apply 0.5mg  (pea-sized amount)  just inside the  vaginal introitus with a finger-tip on then Monday, Wednesday and Friday nights. 12/06/17   Michiel Cowboy A, PA-C  estradiol (ESTRACE) 1 MG tablet Take 1 mg by mouth 2 (two) times a week.    [provider]  ferrous sulfate 325 (65 FE) MG tablet Take 325 mg by mouth daily with breakfast.    [provider]  folic acid (FOLVITE) 800 MCG tablet Take 800 mcg by mouth daily.    [provider]  lamoTRIgine (LAMICTAL) 200 MG tablet Take 200 mg by mouth at bedtime.    [provider]  lisinopril (PRINIVIL,ZESTRIL) 10 MG tablet Take 10 mg by mouth daily.    [provider]  Magnesium 250 MG TABS Take 1 tablet by mouth daily.    [provider]  meloxicam (MOBIC) 15 MG tablet Take 15 mg by mouth daily.    [provider]  Multiple Vitamins-Minerals (MULTIVITAMIN WITH MINERALS) tablet Take 1 tablet by mouth daily.    [provider]  neomycin-polymyxin-dexameth (MAXITROL) 0.1 % OINT 1 application.    [provider]  olmesartan-hydrochlorothiazide (BENICAR HCT) 40-12.5 MG tablet Take 1 tablet by mouth daily.    [provider]  Omega-3 Fatty Acids (FISH OIL) 1000 MG CAPS Take 1 capsule by mouth daily.    [provider]  pravastatin (PRAVACHOL) 40 MG tablet Take 40 mg by mouth at bedtime.    [provider]  Probiotic Product (PROBIOTIC DAILY PO) Take 1 capsule by mouth daily.    [provider]  propranolol ER (INDERAL LA) 60 MG 24 hr capsule Take 60 mg by mouth daily.    [provider]  solifenacin (VESICARE) 5 MG tablet Take 1 tablet (5 mg total) by mouth daily. Patient not taking: Reported on 10/04/2018 12/06/17   Michiel Cowboy A, PA-C  spironolactone (ALDACTONE) 100 MG tablet Take 100 mg by mouth at bedtime.    [provider]  temazepam (RESTORIL) 15 MG capsule Take 15 mg by mouth at bedtime.    [provider]  traZODone (DESYREL) 100 MG tablet Take 200  mg by mouth at bedtime.    [provider]  vitamin B-12 (CYANOCOBALAMIN) 1000 MCG tablet Take 1,000 mcg by mouth daily.    [provider]    Allergies Abilify [aripiprazole] and Duloxetine  Family History  Problem Relation Age of Onset  . Parkinson's disease Mother   . CAD Father   . Hypertension Father   . Breast cancer Neg Hx     Social History Social History   Tobacco Use  . Smoking status: Former Smoker    Types: Cigarettes  . Smokeless tobacco: Never Used  Substance Use Topics  . Alcohol use: No  . Drug use: No    Review of Systems  Constitutional: No fever/chills Eyes: No visual changes. ENT: No sore throat. Respiratory: Denies cough Cardiovascular: Denies chest pain Gastrointestinal: Denies abdominal pain Genitourinary: Negative for dysuria. Musculoskeletal: Positive for upper back pain.  Positive for bilateral shoulder pain, positive for neck pain Skin: Negative for rash. Psychiatric: no mood changes,     ____________________________________________   PHYSICAL EXAM:  VITAL SIGNS: ED Triage Vitals  Enc Vitals Group     BP 12/18/19 2031 (!) 155/98     Pulse Rate 12/18/19 2031 75     Resp 12/18/19 2031 18     Temp 12/18/19 2031 98.4 F (36.9 C)     Temp Source 12/18/19 2031 Oral     SpO2 12/18/19 2031 100 %     Weight 12/18/19 2030 162 lb (73.5 kg)     Height 12/18/19 2030 5\' 7"  (1.702 m)     Head Circumference --      Peak Flow --      Pain Score 12/18/19 2041 10     Pain Loc --      Pain Edu? --      Excl. in GC? --     Constitutional: Alert and oriented. Well appearing and in no acute distress.  Patient is very anxious Eyes: Conjunctivae are normal.  Head: Atraumatic. Nose: No congestion/rhinnorhea. Mouth/Throat: Mucous membranes are moist.   Neck:  supple no lymphadenopathy noted, tenderness noted at C-spine Cardiovascular: Normal rate, regular rhythm. Heart sounds are normal Respiratory: Normal respiratory effort.   No retractions, lungs c t a  GU: deferred Musculoskeletal: FROM all extremities, warm and well perfused, arms are slightly tender on palpation, no bruises has noted, no deformities are noted, neurovascular is intact Neurologic:  Normal speech and language.  Patient does act slightly confused Skin:  Skin is warm, dry and intact. No rash noted. Psychiatric: Mood and affect are normal. Speech and behavior are normal.  ____________________________________________   LABS (all labs ordered are listed, but only abnormal results are displayed)  Labs Reviewed  BASIC METABOLIC PANEL - Abnormal; Notable for the following components:      Result Value   Potassium 3.4 (*)    All other components within normal limits  CBC WITH DIFFERENTIAL/PLATELET - Abnormal; Notable for the following components:   WBC 13.3 (*)    Neutro Abs 9.9 (*)    All other components within normal limits  URINALYSIS, COMPLETE (UACMP) WITH MICROSCOPIC - Abnormal; Notable for the following components:   Color, Urine STRAW (*)    APPearance CLEAR (*)    pH 9.0 (*)    Hgb urine dipstick SMALL (*)    Bacteria, UA RARE (*)    All other components within normal limits  CK  TROPONIN I (HIGH SENSITIVITY)   ____________________________________________   ____________________________________________  RADIOLOGY  CT the head and C-spine are negative for any acute abnormality X-ray of the left shoulder are negative for any acute abnormality  ____________________________________________   PROCEDURES  Procedure(s) performed: EKG   Procedures    ____________________________________________   INITIAL IMPRESSION / ASSESSMENT AND PLAN / ED COURSE  Pertinent labs & imaging results that were available during my care of the patient were reviewed by me and considered in my medical decision making (see chart for details).   Patient is a 74 year old female presents emergency department with bilateral arm and neck pain.   Patient states she is getting nervelike pain.  Concerned about the pain in her anxiety.  She is a poor historian.  Husband is a poor historian.  Had a vaccine 2 weeks ago and steroid injections prior to that.  Physical exam patient's vitals are normal except her blood pressure is little elevated at 155/98.  C-spine is mildly tender, shoulders and arms are tender no bruising noted on the arms, neurovascular is intact, T-spine and lumbar spine are nontender, remainder the exam is unremarkable  Due to the the patient and her husband being poor historians and I do have concerns as she is having bilateral arm pain and some upper back pain I do feel cardiac work-up should be done along with the orthopedic work-up.  DDx: Cervical radiculopathy, cervical spine fracture secondary to fall, head injury secondary to fall, left shoulder fracture/contusion secondary to fall, MI  CBC, basic metabolic panel, troponin, CK, CT the head and C-spine, x-ray of the left shoulder  ----------------------------------------- 11:02 PM on 12/18/2019 -----------------------------------------  CT of the head, C-spine, and x-ray of the left shoulder are all negative for any acute abnormalities  EKG shows normal sinus rhythm  CBC, basic metabolic panel troponin, and CK do not show any acute abnormalities.  Labs are reassuring and consistent with her previous labs.  I do feel patient has a great deal of anxiety along with chronic pain.  We had a long discussion about options for chronic pain management and her anxiety.  She states when she was in [place] doctors had no problem with giving her Xanax and hydrocodone but since they have moved here in the past year she is been taking off these medications and her symptoms have worsened.  Explained to her that most regular physicians do not write this kind of medication so therefore she will need to see specialist.  She was given phone numbers to call the [facility] pain management clinic,  [facility], due to her memory problems I suggested she see [facility] in [place], she was given a prescription for Xanax in case her anxiety increases again tomorrow.  I do feel that her anxiety has something to do with her chronic pain becoming worse.  She states she will comply with our instructions and was discharged in stable condition in the care of her husband.    ANHTHU PERDEW Ryke was evaluated in Emergency Department on 12/18/2019 for the symptoms described in the history of present illness. She was evaluated in the context of the global COVID-19 pandemic, which necessitated consideration that the patient might be at risk for infection with the SARS-CoV-2 virus that causes COVID-19. Institutional protocols and algorithms that pertain to the evaluation of patients at risk for COVID-19 are in a state of rapid change based on information released by regulatory bodies including the CDC and federal and state organizations. These policies and algorithms were followed during the patient's care in the ED.   As part of my medical decision making, I reviewed the following data within the electronic MEDICAL RECORD NUMBER Nursing notes reviewed and incorporated, Labs reviewed , EKG interpreted NSR, Old chart reviewed, Radiograph reviewed , Notes from prior ED visits and [place] Controlled Substance Database  ____________________________________________   FINAL CLINICAL IMPRESSION(S) / ED DIAGNOSES  Final diagnoses:  Anxiety  Musculoskeletal pain of upper extremity, unspecified laterality  Chronic shoulder pain, unspecified laterality      NEW MEDICATIONS STARTED DURING THIS VISIT:  Discharge Medication List as of 12/18/2019 10:50 PM    START taking these medications   Details  ALPRAZolam (XANAX) 0.5 MG tablet Take 1 tablet (0.5 mg total) by mouth 2 (two) times daily as needed for anxiety or sleep., Starting Wed 12/18/2019, Until Thu 12/17/2020, Normal          Note:  This document was prepared using  [employer] and may include unintentional dictation errors.    Faythe Ghee, PA-C 12/18/19 2305    Willy Eddy, MD 12/18/19 [phone]

## 2019-12-18 NOTE — ED Triage Notes (Addendum)
Pt in with co bilat shoulder pain that radiates to elbows bilat. Seen last year for the same and dx with osteoarthritic changes. Pt given steroid injections for the same. States today she felt like pain became severe today. Pt was going to PT but was making pain worse.

## 2019-12-18 NOTE — ED Notes (Signed)
Called pt's husband to come back, per her request.

## 2019-12-18 NOTE — Discharge Instructions (Signed)
Follow-up with Dr. Dareen Piano.  Ask him to test you for Lyme's disease, check your magnesium and vitamin D. Follow-up with the [facility] pain management clinic for your chronic pain Follow-up with the [facility] in [place] for memory and ADD Follow-up with [facility] in [place], asked for [place]

## 2020-01-27 DIAGNOSIS — M899 Disorder of bone, unspecified: Secondary | ICD-10-CM | POA: Insufficient documentation

## 2020-01-27 DIAGNOSIS — Z789 Other specified health status: Secondary | ICD-10-CM | POA: Insufficient documentation

## 2020-01-27 DIAGNOSIS — G894 Chronic pain syndrome: Secondary | ICD-10-CM | POA: Insufficient documentation

## 2020-01-27 DIAGNOSIS — Z79899 Other long term (current) drug therapy: Secondary | ICD-10-CM | POA: Insufficient documentation

## 2020-01-27 NOTE — Progress Notes (Signed)
patient: Jodi Parrish  Service Category: E/M  Provider: Gaspar Cola, MD  DOB: 11-30-1945  DOS: 01/29/2020  Location: Office  MRN: [id number]  Setting: Ambulatory outpatient  Referring Provider: Kirk Ruths, MD  Type: New Patient  Specialty: Interventional Pain Management  PCP: Kirk Ruths, MD  Location: Remote location  Delivery: TeleHealth     Virtual Encounter - Pain Management PROVIDER NOTE: Information contained herein reflects review and annotations entered in association with encounter. Interpretation of such information and data should be left to medically-trained personnel. Information provided to patient can be located elsewhere in the medical record under "Patient Instructions". Document created using STT-dictation technology, any transcriptional errors that may result from process are unintentional.    Contact & Pharmacy Preferred: [phone] Home: [phone] (home) Mobile: [phone] (mobile) E-mail: [email or web address]  [place], [address] [place] [address] Phone: [phone] Fax: [phone]  [place], [address] [place] [address] [address] [place] [address] Phone: [phone] Fax: [phone]  [facility] [place], [address] [place] [address] Phone: [phone] Fax: [phone]   Pre-screening note:  Our staff contacted Jodi Parrish and offered her an "in person", "face-to-face" appointment versus a telephone encounter. She indicated preferring the telephone encounter, at this time.  Primary Reason(s) for Visit: Tele-Encounter for initial evaluation of one or more chronic problems (new to examiner) potentially causing chronic pain, and posing a threat to normal  musculoskeletal function. (Level of risk: High) CC: Shoulder Pain (bilateral), Arm Pain (bilateral upper arm), Wrist Pain (left ), Leg Pain (upper anterior and posterior), Hip Pain (bilateral  s/p joint replacement on right x 2), Knee Pain (bilateral), Ankle Pain (bilateral), and Back Pain (mid, or waiste line)  I contacted Jodi Parrish on 01/29/2020 via telephone.      I clearly identified myself as Gaspar Cola, MD. I verified that I was speaking with the correct person using two identifiers (Name: Jodi Parrish, and date of birth: [birth year]).  Advanced Informed Consent I sought verbal advanced consent from Jodi Parrish for virtual visit interactions. I informed Jodi Parrish of possible security and privacy concerns, risks, and limitations associated with providing "not-in-person" medical evaluation and management services. I also informed Jodi Parrish of the availability of "in-person" appointments. Finally, I informed her that there would be a charge for the virtual visit and that she could be  personally, fully or partially, financially responsible for it. Jodi Parrish expressed understanding and agreed to proceed.   HPI  Jodi Parrish is a 74 y.o. year old, female patient, contacted today for an initial evaluation of her chronic pain. She has Primary osteoarthritis of right hip; Anxiety; Benign esophageal stricture; Bipolar 1 disorder ([place]); Nonexertional chest pain; Healthcare maintenance; Esophageal dysphagia; Essential hypertension; Generalized osteoarthritis of multiple sites; Hypercholesterolemia; Primary osteoarthritis of left shoulder; Use of cane as ambulatory aid; Chronic pain syndrome; Pharmacologic therapy; Disorder of skeletal system; Problems influencing health status; Chronic generalized pain; Depression; Chronic low back pain (1ry area of Pain) (Bilateral) (R>L) w/o sciatica; Chronic feet pain (2ry area of Pain) (Bilateral) (R>L); Chronic shoulder pain (3ry area of Pain)  (Bilateral) (L>R); Chronic upper extremity pain (4th area of Pain) (  Bilateral) (L>R); Chronic mid back pain (5th area of Pain) (Bilateral) (L>R); Cervicalgia; Chronic neck pain (6th area of Pain) (Bilateral) (R>L); Chronic knee pain (8th area of Pain) (Bilateral) (L>R); 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 of left knee joint; 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   Onset and Duration: Gradual and Present longer than 3 months Cause of pain: Unknown Severity: Getting worse, NAS-11 at its worse: 10/10, NAS-11 at its best: 8/10, NAS-11 now: 8/10 and NAS-11 on the average: 8/10 Timing: Not influenced by the time of the day, During activity or exercise and After activity or exercise Aggravating Factors: Bending, Climbing, Kneeling, Prolonged sitting, Prolonged standing, Squatting, Stooping , Twisting, Walking, Walking uphill and Walking downhill Alleviating Factors: Cold packs, Hot packs, Resting and Sitting Associated Problems: Depression, Fatigue, Numbness, Personality changes, Sadness, Sweating, Swelling, Temperature changes, Tingling, Weakness, Pain that wakes patient up and Pain that does not allow patient to sleep Quality of Pain: Aching, Agonizing, Annoying, Burning, Constant, Sharp and Uncomfortable Previous Examinations or Tests: CT scan, MRI scan and Orthopedic evaluation Previous Treatments: Epidural steroid injections and Physical Therapy  According to the patient she has chronic debilitating neck pain, shoulder pain, elbow pain, muscle pain, upper body pain, feet pain, knee pain, left wrist pain, and she has "pain from Parrish to bottom and front to back".  According to her the pain started approximately 3 years ago after a knee replacement which 10 her husband indicated she had 8 to 10 years ago.  The patient kept checking with her husband about her pains.  She indicates having had her pains  treated with pain medications and although she denied having had any pain physicians before coming in here she later explained that she has been seen and treated by Dr. Sharlet Salina and Dr. Sallee Provencal at the [facility].  Prior to coming to [place] she used to live in [place].  She also indicates having had some "shots" of steroids in her back by Dr. Sharlet Salina.  This evaluation took over an hour and a half secondary to the patient's inability to give me any clear answers and having flight of ideas as well as aphasia.  In summary this is the information that I was able to obtain:  The patient's primary area of pain is that of the lower back, bilaterally, with the left being worse than the right.  The patient has been treated by Dr. Sharlet Salina with injections which she indicated did not really help that much and hurt while he was doing them.  She denies any back surgeries, or physical therapy, but does admit to those nerve blocks and she also indicates having had some x-rays of her lower back.  The patient's secondary area pain is described to be that of her feet, bilaterally, with the right being worse than the left.  She denies any surgeries, physical therapy, nerve blocks, joint injections, or any recent x-rays.  The patient's third area pain is that of the shoulders, bilaterally, with the left being worse than the right.  She denies any surgeries, physical therapy, but does admit to having had some injections done into the shoulders by Dr. Sallee Provencal at the present [facility].  She also indicated having had some x-rays in that area.  The fourth area pain is described to be that of the upper extremities, bilaterally, with the left being worse than the right.  She denies any upper  extremity surgeries, physical therapy, nerve blocks or joint injections, but does indicate having had some x-rays, which she was not entirely sure about.  In the case of the left upper extremity the pain goes down into all of her  fingers and she has difficulty holding things and keeps dropping them.  All other fingers seem to be affected except for her thumb.  In the case of the right upper extremity the pain goes from the shoulder down to the elbow.  The patient's fifth area pain is that of the mid back, bilaterally, with the left being worse than the right.  She denies any prior surgeries, but does admit to some physical therapy in [place] years ago which she described as not having helped.  She denies any steroid injections or x-rays.  The patient's sixth area of pain is that of the neck, bilaterally, with the right being worse than the left.  She denies any cervical spine surgeries, nerve blocks, physical therapy, or x-rays.  The patient's next area of pain is that of the knees, bilaterally, with the left being worse than the right.  She apparently had a left total knee replacement done in [place] and not only did not help but it made things worse.  She admits to having had some physical therapy for the neck pain that lasted for several months, but it did not help.  She denies any recent x-rays, nerve blocks, or joint injections.  The patient's next area of pain is that of the hips, bilaterally, with the left being worse than the right, she apparently had a bilateral total hip replacement with the replacement having been done in [place] and another one here by Dr. Rudene Christians.  The patient indicates having had some x-rays done at the orthopedic clinic but she denies any nerve blocks or joint injections.  She also denied any physical therapy.  The patient is being referred to [place] after having had her injections done by Dr. Sharlet Salina.  Today we have reminded the patient that our specialty is interventional pain management and therefore it is difficult for me to understand why she has been sent to [place] if she has already gone to Dr. Sharlet Salina.  She indicated that she had injections done by Dr. Sharlet Salina but not only the day not help but  they were very painful.  She also indicated that she was told that she needed to see [place] since she needed to be with somebody that no more about pain management and Dr. Sharlet Salina.  Interestingly, when I first started talking to the patient today and I asked her about her problems the first thing that she answer was, which problems my physical problems or my mental problems.  I specified to the patient that all I treated was the physical once but later on she indicated having problems with remembering things, aphasia, anxiety, depression, and she also has a history of being bipolar.  I asked her if she had a psychiatrist and she indicated that she did not and therefore I would recommend that she get 1.  I did put a referral for a psychiatric evaluation and treatment since I will not be managing that part of her care.  This was informed to the patient who understood and accepted.  Historic Controlled Substance Pharmacotherapy Review  Current opioid analgesics:  Tramadol 50 mg 1 tablet p.o. twice daily last prescribed on 08/09/2018 Highest recorded MME/day: 10 mg/day MME/day: 0 mg/day   Historical Monitoring: The patient  reports no  history of drug use. List of all UDS Test(s): Lab Results  Component Value Date   MDMA POSITIVE 09/18/2013   COCAINSCRNUR NEGATIVE 09/18/2013   PCPSCRNUR NEGATIVE 09/18/2013   THCU NEGATIVE 09/18/2013   List of other Serum/Urine Drug Screening Test(s):  Lab Results  Component Value Date   COCAINSCRNUR NEGATIVE 09/18/2013   THCU NEGATIVE 09/18/2013   Historical Background Evaluation:  PMP: PDMP reviewed during this encounter. Two (2) year initial data search conducted.             PMP NARX Score Report:  Narcotic: 110 Sedative: 140 Stimulant: 000 Risk Assessment Profile: PMP NARX Overdose Risk Score: 140  Pharmacologic Plan: As per protocol, I have not taken over any controlled substance management, pending the results of ordered tests and/or consults.             Initial impression: Pending review of available data and ordered tests.  Meds   Current Outpatient Medications:  .  amphetamine-dextroamphetamine (ADDERALL) 20 MG tablet, Take 10 mg by mouth daily. , Disp: , Rfl:  .  Cholecalciferol (VITAMIN D-3) 5000 units TABS, Take 1 tablet by mouth daily., Disp: , Rfl:  .  ferrous sulfate 325 (65 FE) MG tablet, Take 325 mg by mouth daily with breakfast., Disp: , Rfl:  .  folic acid (FOLVITE) 371 MCG tablet, Take 800 mcg by mouth daily., Disp: , Rfl:  .  lamoTRIgine (LAMICTAL) 200 MG tablet, Take 200 mg by mouth at bedtime., Disp: , Rfl:  .  lisinopril (PRINIVIL,ZESTRIL) 10 MG tablet, Take 10 mg by mouth daily., Disp: , Rfl:  .  Magnesium 250 MG TABS, Take 1 tablet by mouth daily., Disp: , Rfl:  .  meloxicam (MOBIC) 15 MG tablet, Take 15 mg by mouth daily., Disp: , Rfl:  .  Multiple Vitamins-Minerals (MULTIVITAMIN WITH MINERALS) tablet, Take 1 tablet by mouth daily., Disp: , Rfl:  .  neomycin-polymyxin-dexameth (MAXITROL) 0.1 % OINT, 1 application., Disp: , Rfl:  .  olmesartan-hydrochlorothiazide (BENICAR HCT) 40-12.5 MG tablet, Take 1 tablet by mouth daily., Disp: , Rfl:  .  Omega-3 Fatty Acids (FISH OIL) 1000 MG CAPS, Take 1 capsule by mouth daily., Disp: , Rfl:  .  pravastatin (PRAVACHOL) 40 MG tablet, Take 40 mg by mouth at bedtime., Disp: , Rfl:  .  Probiotic Product (PROBIOTIC DAILY PO), Take 1 capsule by mouth daily., Disp: , Rfl:  .  propranolol ER (INDERAL LA) 60 MG 24 hr capsule, Take 60 mg by mouth daily., Disp: , Rfl:  .  spironolactone (ALDACTONE) 100 MG tablet, Take 100 mg by mouth at bedtime., Disp: , Rfl:  .  temazepam (RESTORIL) 15 MG capsule, Take 15 mg by mouth at bedtime., Disp: , Rfl:  .  traZODone (DESYREL) 100 MG tablet, Take 200 mg by mouth at bedtime., Disp: , Rfl:  .  vitamin B-12 (CYANOCOBALAMIN) 1000 MCG tablet, Take 1,000 mcg by mouth daily., Disp: , Rfl:  .  citalopram (CELEXA) 20 MG tablet, Take 20 mg by mouth daily.,  Disp: , Rfl:  .  gabapentin (NEURONTIN) 300 MG capsule, Take 300 mg by mouth 3 (three) times daily., Disp: , Rfl:   ROS  Cardiovascular: High blood pressure Pulmonary or Respiratory: No reported pulmonary signs or symptoms such as wheezing and difficulty taking a deep full breath (Asthma), difficulty blowing air out (Emphysema), coughing up mucus (Bronchitis), persistent dry cough, or temporary stoppage of breathing during sleep Neurological: No reported neurological signs or symptoms such as seizures, abnormal  skin sensations, urinary and/or fecal incontinence, being born with an abnormal open spine and/or a tethered spinal cord Psychological-Psychiatric: Psychiatric disorder, Anxiousness, Depressed and Difficulty sleeping and or falling asleep Gastrointestinal: Irregular, infrequent bowel movements (Constipation) Genitourinary: No reported renal or genitourinary signs or symptoms such as difficulty voiding or producing urine, peeing blood, non-functioning kidney, kidney stones, difficulty emptying the bladder, difficulty controlling the flow of urine, or chronic kidney disease Hematological: No reported hematological signs or symptoms such as prolonged bleeding, low or poor functioning platelets, bruising or bleeding easily, hereditary bleeding problems, low energy levels due to low hemoglobin or being anemic Endocrine: No reported endocrine signs or symptoms such as high or low blood sugar, rapid heart rate due to high thyroid levels, obesity or weight gain due to slow thyroid or thyroid disease Rheumatologic: Joint aches and or swelling due to excess weight (Osteoarthritis) Musculoskeletal: Negative for myasthenia gravis, muscular dystrophy, multiple sclerosis or malignant hyperthermia Work History: Retired  Allergies  Jodi Parrish is allergic to abilify [aripiprazole] and duloxetine.  Laboratory Chemistry Profile   Renal Lab Results  Component Value Date   BUN 16 12/18/2019   CREATININE  0.92 12/18/2019   GFRAA >60 12/18/2019   GFRNONAA >60 12/18/2019   PROTEINUR NEGATIVE 12/18/2019     Electrolytes Lab Results  Component Value Date   NA 138 12/18/2019   K 3.4 (L) 12/18/2019   CL 100 12/18/2019   CALCIUM 9.9 12/18/2019     Hepatic Lab Results  Component Value Date   AST 30 09/18/2013   ALT 27 09/18/2013   ALBUMIN 3.7 09/18/2013   ALKPHOS 80 09/18/2013   AMMONIA 12 04/06/2017     ID Lab Results  Component Value Date   STAPHAUREUS NEGATIVE 01/04/2017   MRSAPCR NEGATIVE 01/04/2017     Bone No results found.   Endocrine Lab Results  Component Value Date   GLUCOSE 97 12/18/2019   GLUCOSEU NEGATIVE 12/18/2019   TSH 0.95 09/18/2013     Neuropathy No results found.   CNS No results found.   Inflammation (CRP: Acute  ESR: Chronic) Lab Results  Component Value Date   ESRSEDRATE 12 01/04/2017     Rheumatology No results found.   Coagulation Lab Results  Component Value Date   INR 0.99 01/04/2017   LABPROT 13.1 01/04/2017   APTT 26 01/04/2017   PLT 224 12/18/2019     Cardiovascular Lab Results  Component Value Date   CKTOTAL 44 12/18/2019   CKMB 0.5 09/18/2013   TROPONINI <0.03 10/04/2018   HGB 13.5 12/18/2019   HCT 40.2 12/18/2019     Screening Lab Results  Component Value Date   STAPHAUREUS NEGATIVE 01/04/2017   MRSAPCR NEGATIVE 01/04/2017     Cancer No results found.   Allergens No results found.     Note: Lab results reviewed.  Imaging Review  Cervical Imaging: Cervical CT wo contrast:  Results for orders placed during the hospital encounter of 12/18/19  CT Cervical Spine Wo Contrast   Narrative CLINICAL DATA:  Head trauma  EXAM: CT HEAD WITHOUT CONTRAST  CT CERVICAL SPINE WITHOUT CONTRAST  TECHNIQUE: Multidetector CT imaging of the head and cervical spine was performed following the standard protocol without intravenous contrast. Multiplanar CT image reconstructions of the cervical spine were also  generated.  COMPARISON:  10/04/2018  FINDINGS: CT HEAD FINDINGS  Brain: There is no mass, hemorrhage or extra-axial collection. The size and configuration of the ventricles and extra-axial CSF spaces are normal. The brain parenchyma  is normal, without evidence of acute or chronic infarction.  Vascular: No abnormal hyperdensity of the major intracranial arteries or dural venous sinuses. No intracranial atherosclerosis.  Skull: The visualized skull base, calvarium and extracranial soft tissues are normal.  Sinuses/Orbits: No fluid levels or advanced mucosal thickening of the visualized paranasal sinuses. No mastoid or middle ear effusion. The orbits are normal.  CT CERVICAL SPINE FINDINGS  Alignment: Reversal of normal cervical lordosis may be positional or due to muscle spasm.  Skull base and vertebrae: No acute fracture.  Soft tissues and spinal canal: No prevertebral fluid or swelling. No visible canal hematoma.  Disc levels: No advanced spinal canal or neural foraminal stenosis.  Upper chest: No pneumothorax, pulmonary nodule or pleural effusion.  Other: Normal visualized paraspinal cervical soft tissues.  IMPRESSION: 1. Normal head CT. 2. No acute fracture or static subluxation of the cervical spine. 3. Reversal of normal cervical lordosis may be positional or due to muscle spasm.   Electronically Signed   By: Ulyses Jarred M.D.   On: 12/18/2019 22:01    Shoulder Imaging: Shoulder-L DG:  Results for orders placed during the hospital encounter of 12/18/19  DG Shoulder Left   Narrative CLINICAL DATA:  Shoulder pain  EXAM: LEFT SHOULDER - 2+ VIEW  COMPARISON:  None.  FINDINGS: There is narrowing of the glenohumeral joint space with marginal osteophytes at the inferior edge. There subchondral geodes in the humeral head. Acromioclavicular joint is normal. No acute fracture or dislocation.  IMPRESSION: Moderate left glenohumeral osteoarthrosis. No  acute abnormality.   Electronically Signed   By: Ulyses Jarred M.D.   On: 12/18/2019 22:02    Lumbosacral Imaging: Lumbar MR wo contrast:  Results for orders placed during the hospital encounter of 05/17/18  MR LUMBAR SPINE WO CONTRAST   Narrative CLINICAL DATA:  Chronic low back pain.  EXAM: MRI LUMBAR SPINE WITHOUT CONTRAST  TECHNIQUE: Multiplanar, multisequence MR imaging of the lumbar spine was performed. No intravenous contrast was administered.  COMPARISON:  None.  FINDINGS: Segmentation: There is transitional lumbosacral anatomy. The transitional segment will be considered a partially sacralized L5 with a left-sided assimilation joint with the sacrum. The L5-S1 disc is fully formed. T12 ribs are rudimentary or absent.  Alignment: Moderate lumbar levoscoliosis with apex at L3. Trace retrolisthesis of L1 on L2.  Vertebrae: No fracture or suspicious osseous lesion. Severe right-sided disc space narrowing from L1-2 to L3-4 with associated degenerative endplate changes including mild endplate edema at W6-2. Milder disc space narrowing at T11-12, T12-L1, and L4-5. Small a may angioma in the left L3 pedicle.  Conus medullaris and cauda equina: Conus extends to the L1-2 level. Conus and cauda equina appear normal.  Paraspinal and other soft tissues: Multiple T2 hyperintense left renal lesions measuring up to 2.7 cm, likely cysts. 6 mm T1 hyperintense and T2 hypointense left upper pole renal lesion, possibly a proteinaceous or hemorrhagic cyst.  Disc levels:  Disc desiccation throughout the lumbar spine with exception of L5-S1.  T11-12: Disc bulging greater to the left with small superimposed central and left foraminal disc protrusions and mild facet arthrosis resulting in mild left neural foraminal stenosis. No spinal stenosis.  T12-L1: Mild disc bulging and mild facet hypertrophy without stenosis.  L1-2: Mild disc bulging and mild facet and ligamentum  flavum hypertrophy result in mild spinal stenosis without significant neural foraminal stenosis.  L2-3: Disc bulging and spurring asymmetric to the right and mild facet and ligamentum flavum hypertrophy result in borderline spinal  and right lateral recess stenosis and mild right neural foraminal stenosis.  L3-4: Disc bulging and spurring asymmetric to the right and moderate facet and ligamentum flavum hypertrophy result in mild right lateral recess stenosis and mild right neural foraminal stenosis without significant spinal stenosis.  L4-5: Disc bulging slightly greater to the left, broad left subarticular disc protrusion, facet hypertrophy, and mild right and severe left facet arthrosis result in mild spinal stenosis, mild left lateral recess stenosis, and mild right neural foraminal stenosis. A 2.3 x 1.1 cm T2 hyperintense cystic lesion is present posteriorly in the left neural foramen extending into the extraforaminal space with severe narrowing of the foramen and displacement of the exiting L4 nerve.  L5-S1: Right facet arthrosis.  No disc herniation or stenosis.  IMPRESSION: 1. Transitional lumbosacral anatomy as above. 2. 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 3. 2 cm cyst in and lateral to the left neural foramen at L4-5, possibly a synovial cyst given the marked facet degeneration at this level. An incidental large perineural cyst is a differential consideration. Correlate for left L4 (or L5 given transitional anatomy) radiculopathy. 4. Mild spinal and left lateral recess stenosis at L4-5. 5. Mild spinal stenosis at L1-2.   Electronically Signed   By: Logan Bores M.D.   On: 05/18/2018 10:46    Hip Imaging: Hip-R DG 2-3 views:  Results for orders placed during the hospital encounter of 01/10/17  DG HIP UNILAT W OR W/O PELVIS 2-3 VIEWS RIGHT   Narrative CLINICAL DATA:  Right total hip replacement  EXAM: DG HIP (WITH OR WITHOUT  PELVIS) 2-3V RIGHT  COMPARISON:  None.  FINDINGS: Changes of right total hip replacement. Normal alignment. No hardware or bony complicating feature.  IMPRESSION: Right hip replacement.  No complicating feature.   Electronically Signed   By: Rolm Baptise M.D.   On: 01/10/2017 09:51    Complexity Note: Imaging results reviewed. Results shared with Jodi Parrish, using Layman's terms.                        PFSH  Drug: Jodi Parrish  reports no history of drug use. Alcohol:  reports no history of alcohol use. Tobacco:  reports that she has quit smoking. Her smoking use included cigarettes. She has never used smokeless tobacco. Medical:  has a past medical history of Ankle fracture, Anxiety, Arthritis, Bipolar disorder ([place]), Depression, Diverticulitis, Hyperlipidemia, Hypertension, Osteoarthritis, and Overactive bladder. Family: family history includes CAD in her father; Hypertension in her father; Parkinson's disease in her mother.  Past Surgical History:  Procedure Laterality Date  . ABDOMINAL HYSTERECTOMY  1998  . BARTHOLIN GLAND CYST EXCISION  1976  . COLONOSCOPY    . COLONOSCOPY WITH PROPOFOL N/A 04/10/2018   Procedure: COLONOSCOPY WITH PROPOFOL;  Surgeon: Toledo, Benay Pike, MD;  Location: [facility] ENDOSCOPY;  Service: Gastroenterology;  Laterality: N/A;  . ELBOW SURGERY Right 1995   crushed elbow with hardware implanted  . ESOPHAGOGASTRODUODENOSCOPY (EGD) WITH PROPOFOL N/A 04/10/2018   Procedure: ESOPHAGOGASTRODUODENOSCOPY (EGD) WITH PROPOFOL;  Surgeon: Toledo, Benay Pike, MD;  Location: [facility] ENDOSCOPY;  Service: Gastroenterology;  Laterality: N/A;  . JOINT REPLACEMENT    . KNEE SURGERY Left 2000  . PELVIC FLOOR REPAIR  2009   PELVIC FLOOR LIFT  . REPLACEMENT TOTAL KNEE Left 2016  . TONSILLECTOMY  1958  . TOTAL HIP ARTHROPLASTY Left 2008  . TOTAL HIP ARTHROPLASTY Right 01/10/2017   Procedure: TOTAL HIP ARTHROPLASTY ANTERIOR  APPROACH;  Surgeon: Hessie Knows, MD;  Location: [facility]  ORS;  Service: Orthopedics;  Laterality: Right;   Active Ambulatory Problems    Diagnosis Date Noted  . Primary osteoarthritis of right hip 01/10/2017  . Anxiety 04/04/2019  . Benign esophageal stricture 07/19/2018  . Bipolar 1 disorder ([place]) 10/31/2016  . Nonexertional chest pain 10/08/2019  . Healthcare maintenance 02/14/2018  . Esophageal dysphagia 07/19/2018  . Essential hypertension 10/31/2016  . Generalized osteoarthritis of multiple sites 10/31/2016  . Hypercholesterolemia 02/16/2017  . Primary osteoarthritis of left shoulder 06/10/2019  . Use of cane as ambulatory aid 07/13/2018  . Chronic pain syndrome 01/27/2020  . Pharmacologic therapy 01/27/2020  . Disorder of skeletal system 01/27/2020  . Problems influencing health status 01/27/2020  . Chronic generalized pain 01/29/2020  . Depression 01/29/2020  . Chronic low back pain (1ry area of Pain) (Bilateral) (R>L) w/o sciatica 01/29/2020  . Chronic feet pain (2ry area of Pain) (Bilateral) (R>L) 01/29/2020  . Chronic shoulder pain (3ry area of Pain) (Bilateral) (L>R) 01/29/2020  . Chronic upper extremity pain (4th area of Pain) (Bilateral) (L>R) 01/29/2020  . Chronic mid back pain (5th area of Pain) (Bilateral) (L>R) 01/29/2020  . Cervicalgia 01/29/2020  . Chronic neck pain (6th area of Pain) (Bilateral) (R>L) 01/29/2020  . Chronic knee pain (8th area of Pain) (Bilateral) (L>R) 01/29/2020  . 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 of left knee joint 01/29/2020  . Chronic bilateral hip pain after total replacement of both hip joints 01/29/2020  . Chronic hip pain (Bilateral) (L>R) 01/29/2020  . Aphasia 01/29/2020   Resolved Ambulatory Problems    Diagnosis Date Noted  . No Resolved Ambulatory Problems   Past Medical History:  Diagnosis Date  . Ankle fracture   . Arthritis   . Bipolar disorder ([place])   . Diverticulitis   . Hyperlipidemia   . Hypertension   . Osteoarthritis   . Overactive bladder    Assessment   Primary Diagnosis & Pertinent Problem List: The primary encounter diagnosis was Chronic low back pain (1ry area of Pain) (Bilateral) (R>L) w/o sciatica. Diagnoses of Chronic feet pain (2ry area of Pain) (Bilateral) (R>L), Chronic shoulder pain (3ry area of Pain) (Bilateral) (L>R), Chronic upper extremity pain (4th area of Pain) (Bilateral) (L>R), Chronic mid back pain (5th area of Pain) (Bilateral) (L>R), Cervicalgia, Chronic neck pain (6th area of Pain) (Bilateral) (R>L), Chronic knee pain (8th area of Pain) (Bilateral) (L>R), 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 of left knee joint, Chronic bilateral hip pain after total replacement of both hip joints, Chronic hip pain (Bilateral) (L>R), Chronic generalized pain, Chronic pain syndrome, Pharmacologic therapy, Disorder of skeletal system, Problems influencing health status, Anxiety, Depression, unspecified depression type, Bipolar 1 disorder ([place]), and Aphasia were also pertinent to this visit.  Visit Diagnosis (New problems to examiner): 1. Chronic low back pain (1ry area of Pain) (Bilateral) (R>L) w/o sciatica   2. Chronic feet pain (2ry area of Pain) (Bilateral) (R>L)   3. Chronic shoulder pain (3ry area of Pain) (Bilateral) (L>R)   4. Chronic upper extremity pain (4th area of Pain) (Bilateral) (L>R)   5. Chronic mid back pain (5th area of Pain) (Bilateral) (L>R)   6. Cervicalgia   7. Chronic neck pain (6th area of Pain) (Bilateral) (R>L)   8. Chronic knee pain (8th area of Pain) (Bilateral) (L>R)   9. 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 of left knee joint   10. Chronic bilateral hip pain after total replacement of both  hip joints   11. Chronic hip pain (Bilateral) (L>R)   12. Chronic generalized pain   13. Chronic pain syndrome   14. Pharmacologic therapy   15. Disorder of skeletal system   16. Problems influencing health status   17. Anxiety   18. Depression, unspecified depression type   19. Bipolar 1 disorder ([place])   20.  Aphasia    Plan of Care (Initial workup plan)  Note: Jodi Parrish was reminded that as per protocol, today's visit has been an evaluation only. We have not taken over the patient's controlled substance management.  Problem-specific plan: No problem-specific Assessment & Plan notes found for this encounter.   Lab Orders     Compliance Drug Analysis, Ur     Comp. Metabolic Panel (12)     Magnesium     Vitamin B12     Sedimentation rate     25-Hydroxy vitamin D Lcms D2+D3     C-reactive protein  Imaging Orders     DG Lumbar Spine Complete W/Bend     DG Foot Complete Left     DG Foot Complete Right     DG Shoulder Right     DG Shoulder Left     DG Cervical Spine With Flex & Extend     DG Thoracic Spine 2 View     DG Knee 1-2 Views Right     DG Knee 1-2 Views Left  Referral Orders     Ambulatory referral to Psychiatry Procedure Orders    No procedure(s) ordered today   Pharmacotherapy (current): Medications ordered:  No orders of the defined types were placed in this encounter.    Pharmacological management options:  Opioid Analgesics: The patient was informed that there is no guarantee that she would be a candidate for opioid analgesics. The decision will be made following CDC guidelines. This decision will be based on the results of diagnostic studies, as well as Jodi Parrish's risk profile.   Membrane stabilizer: To be determined at a later time  Muscle relaxant: To be determined at a later time  NSAID: To be determined at a later time  Other analgesic(s): To be determined at a later time   Interventional management options: Jodi Parrish was informed that there is no guarantee that she would be a candidate for interventional therapies. The decision will be based on the results of diagnostic studies, as well as Jodi Parrish's risk profile.  Procedure(s) under consideration:  Pending results of ordered tests.   Provider-requested follow-up: Return for (VV), (2V) after  having completed all of the tests.  No future appointments. Total duration of encounter: 90 minutes.  Primary Care Physician: Kirk Ruths, MD Note by: Gaspar Cola, MD Date: 01/29/2020; Time: 6:13 PM

## 2020-01-28 ENCOUNTER — Encounter: Payer: Self-pay | Admitting: Pain Medicine

## 2020-01-29 ENCOUNTER — Other Ambulatory Visit: Payer: Self-pay

## 2020-01-29 ENCOUNTER — Ambulatory Visit: Payer: Medicare Other | Attending: Pain Medicine | Admitting: Pain Medicine

## 2020-01-29 VITALS — Ht 68.0 in | Wt 163.0 lb

## 2020-01-29 DIAGNOSIS — M25511 Pain in right shoulder: Secondary | ICD-10-CM | POA: Diagnosis not present

## 2020-01-29 DIAGNOSIS — M545 Low back pain: Secondary | ICD-10-CM | POA: Diagnosis not present

## 2020-01-29 DIAGNOSIS — M899 Disorder of bone, unspecified: Secondary | ICD-10-CM

## 2020-01-29 DIAGNOSIS — M25559 Pain in unspecified hip: Secondary | ICD-10-CM | POA: Insufficient documentation

## 2020-01-29 DIAGNOSIS — Z789 Other specified health status: Secondary | ICD-10-CM

## 2020-01-29 DIAGNOSIS — R4701 Aphasia: Secondary | ICD-10-CM | POA: Insufficient documentation

## 2020-01-29 DIAGNOSIS — M79602 Pain in left arm: Secondary | ICD-10-CM

## 2020-01-29 DIAGNOSIS — M79601 Pain in right arm: Secondary | ICD-10-CM

## 2020-01-29 DIAGNOSIS — M79671 Pain in right foot: Secondary | ICD-10-CM

## 2020-01-29 DIAGNOSIS — G8929 Other chronic pain: Secondary | ICD-10-CM | POA: Insufficient documentation

## 2020-01-29 DIAGNOSIS — F329 Major depressive disorder, single episode, unspecified: Secondary | ICD-10-CM

## 2020-01-29 DIAGNOSIS — M549 Dorsalgia, unspecified: Secondary | ICD-10-CM

## 2020-01-29 DIAGNOSIS — M25561 Pain in right knee: Secondary | ICD-10-CM

## 2020-01-29 DIAGNOSIS — F319 Bipolar disorder, unspecified: Secondary | ICD-10-CM

## 2020-01-29 DIAGNOSIS — Z96643 Presence of artificial hip joint, bilateral: Secondary | ICD-10-CM

## 2020-01-29 DIAGNOSIS — M25512 Pain in left shoulder: Secondary | ICD-10-CM

## 2020-01-29 DIAGNOSIS — R52 Pain, unspecified: Secondary | ICD-10-CM

## 2020-01-29 DIAGNOSIS — G8928 Other chronic postprocedural pain: Secondary | ICD-10-CM

## 2020-01-29 DIAGNOSIS — Z79899 Other long term (current) drug therapy: Secondary | ICD-10-CM

## 2020-01-29 DIAGNOSIS — F32A Depression, unspecified: Secondary | ICD-10-CM | POA: Insufficient documentation

## 2020-01-29 DIAGNOSIS — M79672 Pain in left foot: Secondary | ICD-10-CM

## 2020-01-29 DIAGNOSIS — G894 Chronic pain syndrome: Secondary | ICD-10-CM

## 2020-01-29 DIAGNOSIS — M25552 Pain in left hip: Secondary | ICD-10-CM

## 2020-01-29 DIAGNOSIS — M542 Cervicalgia: Secondary | ICD-10-CM

## 2020-01-29 DIAGNOSIS — M25562 Pain in left knee: Secondary | ICD-10-CM

## 2020-01-29 DIAGNOSIS — F419 Anxiety disorder, unspecified: Secondary | ICD-10-CM

## 2020-01-29 DIAGNOSIS — M25551 Pain in right hip: Secondary | ICD-10-CM

## 2020-01-29 DIAGNOSIS — Z96652 Presence of left artificial knee joint: Secondary | ICD-10-CM

## 2020-03-12 ENCOUNTER — Other Ambulatory Visit: Payer: Self-pay

## 2020-03-12 ENCOUNTER — Ambulatory Visit
Admission: RE | Admit: 2020-03-12 | Discharge: 2020-03-12 | Disposition: A | Discharge status: Home / Self Care | Payer: Medicare Other | Source: Ambulatory Visit | Attending: Pain Medicine | Admitting: Pain Medicine

## 2020-03-12 DIAGNOSIS — M79672 Pain in left foot: Secondary | ICD-10-CM | POA: Insufficient documentation

## 2020-03-12 DIAGNOSIS — Z96652 Presence of left artificial knee joint: Secondary | ICD-10-CM | POA: Diagnosis present

## 2020-03-12 DIAGNOSIS — M542 Cervicalgia: Secondary | ICD-10-CM | POA: Insufficient documentation

## 2020-03-12 DIAGNOSIS — M25512 Pain in left shoulder: Secondary | ICD-10-CM | POA: Insufficient documentation

## 2020-03-12 DIAGNOSIS — M25511 Pain in right shoulder: Secondary | ICD-10-CM | POA: Insufficient documentation

## 2020-03-12 DIAGNOSIS — M549 Dorsalgia, unspecified: Secondary | ICD-10-CM | POA: Insufficient documentation

## 2020-03-12 DIAGNOSIS — M79671 Pain in right foot: Secondary | ICD-10-CM | POA: Insufficient documentation

## 2020-03-12 DIAGNOSIS — M25561 Pain in right knee: Secondary | ICD-10-CM | POA: Diagnosis present

## 2020-03-12 DIAGNOSIS — M25562 Pain in left knee: Secondary | ICD-10-CM | POA: Insufficient documentation

## 2020-03-12 DIAGNOSIS — G8929 Other chronic pain: Secondary | ICD-10-CM | POA: Diagnosis present

## 2020-03-12 DIAGNOSIS — M545 Low back pain: Secondary | ICD-10-CM | POA: Diagnosis present

## 2020-03-17 ENCOUNTER — Encounter: Payer: Self-pay | Admitting: Emergency Medicine

## 2020-03-17 ENCOUNTER — Emergency Department
Admission: EM | Admit: 2020-03-17 | Discharge: 2020-03-17 | Disposition: A | Discharge status: Home / Self Care | Payer: Medicare Other | Attending: Emergency Medicine | Admitting: Emergency Medicine

## 2020-03-17 ENCOUNTER — Other Ambulatory Visit: Payer: Self-pay

## 2020-03-17 DIAGNOSIS — I10 Essential (primary) hypertension: Secondary | ICD-10-CM | POA: Insufficient documentation

## 2020-03-17 DIAGNOSIS — K5900 Constipation, unspecified: Secondary | ICD-10-CM | POA: Diagnosis not present

## 2020-03-17 DIAGNOSIS — R1030 Lower abdominal pain, unspecified: Secondary | ICD-10-CM

## 2020-03-17 DIAGNOSIS — Z96653 Presence of artificial knee joint, bilateral: Secondary | ICD-10-CM | POA: Diagnosis not present

## 2020-03-17 DIAGNOSIS — Z87891 Personal history of nicotine dependence: Secondary | ICD-10-CM | POA: Diagnosis not present

## 2020-03-17 DIAGNOSIS — R1010 Upper abdominal pain, unspecified: Secondary | ICD-10-CM | POA: Diagnosis present

## 2020-03-17 LAB — COMPREHENSIVE METABOLIC PANEL
ALT: 11 U/L (ref 0–44)
AST: 13 U/L — ABNORMAL LOW (ref 15–41)
Albumin: 4.3 g/dL (ref 3.5–5.0)
Alkaline Phosphatase: 76 U/L (ref 38–126)
Anion gap: 10 (ref 5–15)
BUN: 13 mg/dL (ref 8–23)
CO2: 29 mmol/L (ref 22–32)
Calcium: 9.8 mg/dL (ref 8.9–10.3)
Chloride: 101 mmol/L (ref 98–111)
Creatinine, Ser: 0.94 mg/dL (ref 0.44–1.00)
GFR calc Af Amer: >60 mL/min (ref >60)
GFR calc non Af Amer: >60 mL/min (ref >60)
Glucose, Bld: 109 mg/dL — ABNORMAL HIGH (ref 70–99)
Potassium: 4.1 mmol/L (ref 3.5–5.1)
Sodium: 140 mmol/L (ref 135–145)
Total Bilirubin: 0.8 mg/dL (ref 0.3–1.2)
Total Protein: 6.8 g/dL (ref 6.5–8.1)

## 2020-03-17 LAB — CBC
HCT: 39.1 % (ref 36.0–46.0)
Hemoglobin: 12.6 g/dL (ref 12.0–15.0)
MCH: 31.4 pg (ref 26.0–34.0)
MCHC: 32.2 g/dL (ref 30.0–36.0)
MCV: 97.5 fL (ref 80.0–100.0)
Platelets: 192 10*3/uL (ref 150–400)
RBC: 4.01 MIL/uL (ref 3.87–5.11)
RDW: 13.2 % (ref 11.5–15.5)
WBC: 9.4 10*3/uL (ref 4.0–10.5)
nRBC: 0 % (ref 0.0–0.2)

## 2020-03-17 LAB — LIPASE, BLOOD: Lipase: 27 U/L (ref 11–51)

## 2020-03-17 MED ORDER — AMOXICILLIN-POT CLAVULANATE 875-125 MG PO TABS
1.0000 | ORAL_TABLET | Freq: Two times a day (BID) | ORAL | 0 refills | Status: AC
Start: 2020-03-17 — End: 2020-03-24

## 2020-03-17 MED ORDER — POLYETHYLENE GLYCOL 3350 17 G PO PACK: 17.0000 g | PACK | Freq: Every day | ORAL | 0 refills | Status: DC

## 2020-03-17 MED ORDER — DICYCLOMINE HCL 10 MG PO CAPS: 10.0000 mg | ORAL_CAPSULE | Freq: Four times a day (QID) | ORAL | 0 refills | Status: DC

## 2020-03-17 MED ORDER — BISACODYL 10 MG RE SUPP: 10.0000 mg | RECTAL | 0 refills | Status: DC | PRN

## 2020-03-17 MED ORDER — SODIUM CHLORIDE 0.9% FLUSH: 3.0000 mL | Freq: Once | INTRAVENOUS | Status: DC

## 2020-03-17 NOTE — ED Notes (Signed)
Signature pad in room not working. Patient and husband verbalized understanding of discharge instructions and follow-up care.

## 2020-03-17 NOTE — ED Provider Notes (Signed)
[facility] Emergency Department Provider Note   ____________________________________________    I have reviewed the triage vital signs and the nursing notes.   HISTORY  Chief Complaint Abdominal Pain     HPI Jodi Parrish is a 74 y.o. female with past medical history including diverticulitis as noted below presents with complaints of abdominal pain.  Patient reports many years of intermittent upper abdominal pain which does not seem to have any pattern that she has discerned.  She reports she had the pain last night, it seems to have eased now.  Denies nausea or vomiting.  In the past she has assumed that it is related to constipation.  She has been constipated over the last 24 hours, has taken some over-the-counter medications.  No fevers or chills.  Denies a history of abdominal surgery  Past Medical History:  Diagnosis Date  . Ankle fracture    right  . Anxiety   . Arthritis   . Bipolar disorder ([facility])   . Depression   . Diverticulitis   . Hyperlipidemia   . Hypertension   . Osteoarthritis   . Overactive bladder     Patient Active Problem List   Diagnosis Date Noted  . Chronic generalized pain 01/29/2020  . Depression 01/29/2020  . Chronic low back pain (1ry area of Pain) (Bilateral) (R>L) w/o sciatica 01/29/2020  . Chronic feet pain (2ry area of Pain) (Bilateral) (R>L) 01/29/2020  . Chronic shoulder pain (3ry area of Pain) (Bilateral) (L>R) 01/29/2020  . Chronic upper extremity pain (4th area of Pain) (Bilateral) (L>R) 01/29/2020  . Chronic mid back pain (5th area of Pain) (Bilateral) (L>R) 01/29/2020  . Cervicalgia 01/29/2020  . Chronic neck pain (6th area of Pain) (Bilateral) (R>L) 01/29/2020  . Chronic knee pain (8th area of Pain) (Bilateral) (L>R) 01/29/2020  . 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 of left knee joint 01/29/2020  . Chronic bilateral hip pain after total replacement of both hip joints 01/29/2020  . Chronic hip  pain (Bilateral) (L>R) 01/29/2020  . Aphasia 01/29/2020  . Chronic pain syndrome 01/27/2020  . Pharmacologic therapy 01/27/2020  . Disorder of skeletal system 01/27/2020  . Problems influencing health status 01/27/2020  . Nonexertional chest pain 10/08/2019  . Primary osteoarthritis of left shoulder 06/10/2019  . Anxiety 04/04/2019  . Benign esophageal stricture 07/19/2018  . Esophageal dysphagia 07/19/2018  . Use of cane as ambulatory aid 07/13/2018  . Healthcare maintenance 02/14/2018  . Hypercholesterolemia 02/16/2017  . Primary osteoarthritis of right hip 01/10/2017  . Bipolar 1 disorder ([facility]) 10/31/2016  . Essential hypertension 10/31/2016  . Generalized osteoarthritis of multiple sites 10/31/2016    Past Surgical History:  Procedure Laterality Date  . ABDOMINAL HYSTERECTOMY  1998  . BARTHOLIN GLAND CYST EXCISION  1976  . COLONOSCOPY    . COLONOSCOPY WITH PROPOFOL N/A 04/10/2018   Procedure: COLONOSCOPY WITH PROPOFOL;  Surgeon: Toledo, Boykin Nearing, MD;  Location: [facility] END[facility]COPY;  Service: Gastroenterology;  Laterality: N/A;  . ELBOW SURGERY Right 1995   crushed elbow with hardware implanted  . ESOPHAGOGASTRODUODENOSCOPY (EGD) WITH PROPOFOL N/A 04/10/2018   Procedure: ESOPHAGOGASTRODUODENOSCOPY (EGD) WITH PROPOFOL;  Surgeon: Toledo, Boykin Nearing, MD;  Location: [facility] END[facility]COPY;  Service: Gastroenterology;  Laterality: N/A;  . JOINT REPLACEMENT    . KNEE SURGERY Left 2000  . PELVIC FLOOR REPAIR  2009   PELVIC FLOOR LIFT  . REPLACEMENT TOTAL KNEE Left 2016  . TONSILLECTOMY  1958  . TOTAL HIP ARTHROPLASTY Left 2008  .  TOTAL HIP ARTHROPLASTY Right 01/10/2017   Procedure: TOTAL HIP ARTHROPLASTY ANTERIOR APPROACH;  Surgeon: Kennedy Bucker, MD;  Location: [facility] ORS;  Service: Orthopedics;  Laterality: Right;    Prior to Admission medications   Medication Sig Start Date End Date Taking? Authorizing Provider  amoxicillin-clavulanate (AUGMENTIN) 875-125 MG tablet Take 1 tablet by mouth 2  (two) times daily for 7 days. 03/17/20 03/24/20  Jene Every, MD  amphetamine-dextroamphetamine (ADDERALL) 20 MG tablet Take 10 mg by mouth daily.     [provider]  bisacodyl (DULCOLAX) 10 MG suppository Place 1 suppository (10 mg total) rectally as needed for moderate constipation. 03/17/20   Jene Every, MD  Cholecalciferol (VITAMIN D-3) 5000 units TABS Take 1 tablet by mouth daily.    [provider]  citalopram (CELEXA) 20 MG tablet Take 20 mg by mouth daily.    [provider]  dicyclomine (BENTYL) 10 MG capsule Take 1 capsule (10 mg total) by mouth 4 (four) times daily for 14 days. 03/17/20 03/31/20  Jene Every, MD  ferrous sulfate 325 (65 FE) MG tablet Take 325 mg by mouth daily with breakfast.    [provider]  folic acid (FOLVITE) 800 MCG tablet Take 800 mcg by mouth daily.    [provider]  gabapentin (NEURONTIN) 300 MG capsule Take 300 mg by mouth 3 (three) times daily. 12/03/19   [provider]  lamoTRIgine (LAMICTAL) 200 MG tablet Take 200 mg by mouth at bedtime.    [provider]  lisinopril (PRINIVIL,ZESTRIL) 10 MG tablet Take 10 mg by mouth daily.    [provider]  Magnesium 250 MG TABS Take 1 tablet by mouth daily.    [provider]  meloxicam (MOBIC) 15 MG tablet Take 15 mg by mouth daily.    [provider]  Multiple Vitamins-Minerals (MULTIVITAMIN WITH MINERALS) tablet Take 1 tablet by mouth daily.    [provider]  neomycin-polymyxin-dexameth (MAXITROL) 0.1 % OINT 1 application.    [provider]  olmesartan-hydrochlorothiazide (BENICAR HCT) 40-12.5 MG tablet Take 1 tablet by mouth daily.    [provider]  Omega-3 Fatty Acids (FISH OIL) 1000 MG CAPS Take 1 capsule by mouth daily.    [provider]  polyethylene glycol (MIRALAX) 17 g packet Take 17 g by mouth daily. 03/17/20   Jene Every, MD  pravastatin (PRAVACHOL) 40 MG tablet  Take 40 mg by mouth at bedtime.    [provider]  Probiotic Product (PROBIOTIC DAILY PO) Take 1 capsule by mouth daily.    [provider]  propranolol ER (INDERAL LA) 60 MG 24 hr capsule Take 60 mg by mouth daily.    [provider]  spironolactone (ALDACTONE) 100 MG tablet Take 100 mg by mouth at bedtime.    [provider]  temazepam (RESTORIL) 15 MG capsule Take 15 mg by mouth at bedtime.    [provider]  traZODone (DESYREL) 100 MG tablet Take 200 mg by mouth at bedtime.    [provider]  vitamin B-12 (CYANOCOBALAMIN) 1000 MCG tablet Take 1,000 mcg by mouth daily.    [provider]     Allergies Abilify [aripiprazole] and Duloxetine  Family History  Problem Relation Age of Onset  . Parkinson's disease Mother   . CAD Father   . Hypertension Father   . Breast cancer Neg Hx     Social History Social History   Tobacco Use  . Smoking status: Former Smoker  Types: Cigarettes  . Smokeless tobacco: Never Used  Vaping Use  . Vaping Use: Never used  Substance Use Topics  . Alcohol use: No  . Drug use: No    Review of Systems  Constitutional: No fever/chills Eyes: No visual changes.  ENT: No sore throat. Cardiovascular: Denies chest pain. Respiratory: Denies shortness of breath. Gastrointestinal: As above Genitourinary: Negative for dysuria. Musculoskeletal: Negative for back pain. Skin: Negative for rash. Neurological: Negative for headaches   ____________________________________________   PHYSICAL EXAM:  VITAL SIGNS: ED Triage Vitals  Enc Vitals Group     BP 03/17/20 1109 118/67     Pulse Rate 03/17/20 1109 66     Resp 03/17/20 1109 18     Temp 03/17/20 1109 98.1 F (36.7 C)     Temp src --      SpO2 03/17/20 1109 99 %     Weight 03/17/20 1109 76.2 kg (168 lb)     Height 03/17/20 1109 1.727 m (5\' 8" )     Head Circumference --      Peak Flow --      Pain Score 03/17/20 1118 2      Pain Loc --      Pain Edu? --      Excl. in GC? --     Constitutional: Alert and oriented.  Nose: No congestion/rhinnorhea.  Neck:  Painless ROM Cardiovascular: Normal rate, regular rhythm. Grossly normal heart sounds.  Good peripheral circulation. Respiratory: Normal respiratory effort.  No retractions. Lungs CTAB. Gastrointestinal: Soft and nontender. No distention.  No CVA tenderness.  Reassuring exam  Musculoskeletal:  Warm and well perfused Neurologic:  Normal speech and language. No gross focal neurologic deficits are appreciated.  Skin:  Skin is warm, dry and intact. No rash noted. Psychiatric: Mood and affect are normal. Speech and behavior are normal.  ____________________________________________   LABS (all labs ordered are listed, but only abnormal results are displayed)  Labs Reviewed  COMPREHENSIVE METABOLIC PANEL - Abnormal; Notable for the following components:      Result Value   Glucose, Bld 109 (*)    AST 13 (*)    All other components within normal limits  LIPASE, BLOOD  CBC  URINALYSIS, COMPLETE (UACMP) WITH MICROSCOPIC   ____________________________________________  EKG   ____________________________________________  RADIOLOGY   ____________________________________________   PROCEDURES  Procedure(s) performed: No  Procedures   Critical Care performed: No ____________________________________________   INITIAL IMPRESSION / ASSESSMENT AND PLAN / ED COURSE  Pertinent labs & imaging results that were available during my care of the patient were reviewed by me and considered in my medical decision making (see chart for details).  Patient quite well-appearing and in no acute distress.  Abdominal exam is benign.  Differential is extensive but includes gastritis, constipation GERD, cholelithiasis, diverticulitis.  Discussed with her the need for outpatient GI follow-up for her ongoing chronic pain.  We will start her on MiraLAX for her  constipation, recommended taking this daily to avoid constipation.  Lab work today is quite reassuring.  Discussed with husband he notes that she has a history of diverticulitis.  Will trial Bentyl, wrote prescription for antibiotics if development of left lower quadrant pain only.  Appropriate for outpatient follow-up at this time, if worsening symptoms return to the emergency department    ____________________________________________   FINAL CLINICAL IMPRESSION(S) / ED DIAGNOSES  Final diagnoses:  Lower abdominal pain  Constipation, unspecified constipation type        Note:  This document  was prepared using Conservation officer, historic buildings and may include unintentional dictation errors.   Jene Every, MD 03/17/20 [id number]

## 2020-03-17 NOTE — ED Triage Notes (Signed)
Says low abd pain for 4 days.  Says it's a quick pain that comes and goes.  Sometimes affected by movement.  Says she has had this before on and off for 4-5 years, but never went to doctor.  Says she does feel constipated with hard stools.  Last bm yesterday.   No problems with urine.

## 2020-03-19 LAB — COMPLIANCE DRUG ANALYSIS, UR

## 2020-03-20 LAB — COMP. METABOLIC PANEL (12)
AST: 13 IU/L (ref 0–40)
Albumin/Globulin Ratio: 2 (ref 1.2–2.2)
Albumin: 4.7 g/dL (ref 3.7–4.7)
Alkaline Phosphatase: 116 IU/L (ref 48–121)
BUN/Creatinine Ratio: 16 (ref 12–28)
BUN: 17 mg/dL (ref 8–27)
Bilirubin Total: 0.3 mg/dL (ref 0.0–1.2)
Calcium: 10.2 mg/dL (ref 8.7–10.3)
Chloride: 104 mmol/L (ref 96–106)
Creatinine, Ser: 1.05 mg/dL — ABNORMAL HIGH (ref 0.57–1.00)
GFR calc Af Amer: 61 mL/min/{1.73_m2} (ref >59)
GFR calc non Af Amer: 53 mL/min/{1.73_m2} — ABNORMAL LOW (ref >59)
Globulin, Total: 2.4 g/dL (ref 1.5–4.5)
Glucose: 91 mg/dL (ref 65–99)
Potassium: 5.5 mmol/L — ABNORMAL HIGH (ref 3.5–5.2)
Sodium: 150 mmol/L — ABNORMAL HIGH (ref 134–144)
Total Protein: 7.1 g/dL (ref 6.0–8.5)

## 2020-03-20 LAB — 25-HYDROXY VITAMIN D LCMS D2+D3
25-Hydroxy, Vitamin D-2: <1 ng/mL
25-Hydroxy, Vitamin D-3: 44 ng/mL
25-Hydroxy, Vitamin D: 44 ng/mL

## 2020-03-20 LAB — C-REACTIVE PROTEIN: CRP: 12 mg/L — ABNORMAL HIGH (ref 0–10)

## 2020-03-20 LAB — VITAMIN B12: Vitamin B-12: 636 pg/mL (ref 232–1245)

## 2020-03-20 LAB — MAGNESIUM: Magnesium: 2.4 mg/dL — ABNORMAL HIGH (ref 1.6–2.3)

## 2020-03-20 LAB — SEDIMENTATION RATE: Sed Rate: 23 mm/hr (ref 0–40)

## 2020-04-08 ENCOUNTER — Telehealth: Payer: Medicare Other | Admitting: Pain Medicine

## 2020-04-12 NOTE — Progress Notes (Signed)
PROVIDER NOTE: Information contained herein reflects review and annotations entered in association with encounter. Interpretation of such information and data should be left to medically-trained personnel. Information provided to patient can be located elsewhere in the medical record under "Patient Instructions". Document created using STT-dictation technology, any transcriptional errors that may result from process are unintentional.    Patient: Jodi Parrish  Service Category: E/M  Provider: Gaspar Cola, MD  DOB: August 19, 1946  DOS: 04/13/2020  Specialty: Interventional Pain Management  MRN: [id number]  Setting: Ambulatory outpatient  PCP: Kirk Ruths, MD  Type: Established Patient    Referring Provider: Kirk Ruths, MD  Location: Office  Delivery: Face-to-face     Primary Reason(s) for Visit: Encounter for evaluation before starting new chronic pain management plan of care (Level of risk: moderate) CC: Pain ("all my joints")  HPI  Jodi Parrish is a 74 y.o. year old, female patient, who comes today for a follow-up evaluation to review the test results and decide on a treatment plan. She has Osteoarthritis of hip (Right); Anxiety; Benign esophageal stricture; Bipolar 1 disorder ([place]); Nonexertional chest pain; Healthcare maintenance; Esophageal dysphagia; Essential hypertension; Generalized osteoarthritis of multiple sites; Hypercholesterolemia; Osteoarthritis of shoulder (SEVERE) (Left); Use of cane as ambulatory aid; Chronic pain syndrome; Pharmacologic therapy; Disorder of skeletal system; Problems influencing health status; Chronic generalized pain; Depression; Chronic low back pain (1ry area of Pain) (Bilateral) (R>L) w/o sciatica; Chronic feet pain (2ry area of Pain) (Bilateral) (R>L); Chronic shoulder pain (3ry area of Pain) (Bilateral) (L>R); Chronic upper extremity pain (4th area of Pain) (Bilateral) (L>R); Chronic mid back pain (5th area of Pain) (Bilateral) (L>R);  Cervicalgia; Chronic neck pain (6th area of Pain) (Bilateral) (R>L); Chronic knee pain (8th area of Pain) (Bilateral) (L>R); 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); 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); Chronic hip pain (Bilateral) (L>R); Aphasia; Osteoarthritis involving multiple joints; Psychiatric disorder; Elevated C-reactive protein (CRP); Cervical spondylosis without myelopathy; DDD (degenerative disc disease), cervical; 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, C5-6) (Left); Cervical kyphosis; Osteoarthritis of glenohumeral joints (Bilateral); Osteoarthritis of AC (acromioclavicular) joint (Right); Thoracic scoliosis (Right); Thoracic spondylosis without myelopathy; DDD (degenerative disc disease), thoracic; DDD (degenerative disc disease), lumbosacral; Lumbar levoscoliosis; Lumbar Grade 1 Retrolisthesis of L1/L2; Severe Narrowing of lumbar intervertebral disc space from L1-4 (Right); Renal cyst; Abnormal MRI, lumbar spine (05/18/2018); Lumbosacral facet hypertrophy (Multilevel) (Bilateral); and Lumbosacral facet syndrome (Multilevel) (Bilateral) (L>R) on their problem list. Her primarily concern today is the Pain ("all my joints")  Pain Assessment: Location:    (all of her joints) Onset: More than a month ago Duration: Chronic pain Quality: Aching, Constant, Tingling Severity: 9 /10 (subjective, self-reported pain score)  Note: Reported level is compatible with observation.                         When using our objective Pain Scale, levels between 6 and 10/10 are said to belong in an emergency room, as it progressively worsens from a 6/10, described as severely limiting, requiring emergency care not usually available at an outpatient pain management facility. At a 6/10 level, communication becomes difficult and requires great effort. Assistance to reach the emergency department may be required. Facial flushing and profuse sweating along with potentially dangerous  increases in heart rate and blood pressure will be evident. Timing: Constant Modifying factors: rest, walking, heat, ice, hot showers BP: (!) 141/101  HR: 69  Ms. Jodi Parrish comes in  today for a follow-up visit after her initial evaluation on Visit date not found. Today we went over the results of her tests. These were explained in "Layman's terms". During today's appointment we went over my diagnostic impression, as well as the proposed treatment plan.  According to the patient she has chronic debilitating neck pain, shoulder pain, elbow pain, muscle pain, upper body pain, feet pain, knee pain, left wrist pain, and she has "pain from top to bottom and front to back".  According to her the pain started approximately 3 years ago after a knee replacement which 10 her husband indicated she had 8 to 10 years ago.  The patient kept checking with her husband about her pains.  She indicates having had her pains treated with pain medications and although she denied having had any pain physicians before coming in here she later explained that she has been seen and treated by Dr. Sharlet Salina and Dr. Sallee Provencal at the [facility].  Prior to coming to [place] she used to live in [place].  She also indicates having had some "shots" of steroids in her back by Dr. Sharlet Salina.  The initial evaluation took over an hour and a half secondary to the patient's inability to give me any clear answers and having flight of ideas as well as aphasia.  In summary this is the information that I was able to obtain:  The patient's primary area of pain is that of the lower back, bilaterally, with the left being worse than the right.  The patient has been treated by Dr. Sharlet Salina with injections which she indicated did not really help that much and hurt while he was doing them.  She denies any back surgeries, or physical therapy, but does admit to those nerve blocks and she also indicates having had some x-rays of her lower back.  The  patient's secondary area pain is described to be that of her feet, bilaterally, with the right being worse than the left.  She denies any surgeries, physical therapy, nerve blocks, joint injections, or any recent x-rays.  The patient's third area pain is that of the shoulders, bilaterally, with the left being worse than the right.  She denies any surgeries, physical therapy, but does admit to having had some injections done into the shoulders by Dr. Sallee Provencal at the present [facility].  She also indicated having had some x-rays in that area.  The fourth area pain is described to be that of the upper extremities, bilaterally, with the left being worse than the right.  She denies any upper extremity surgeries, physical therapy, nerve blocks or joint injections, but does indicate having had some x-rays, which she was not entirely sure about.  In the case of the left upper extremity the pain goes down into all of her fingers and she has difficulty holding things and keeps dropping them.  All other fingers seem to be affected except for her thumb.  In the case of the right upper extremity the pain goes from the shoulder down to the elbow.  The patient's fifth area pain is that of the mid back, bilaterally, with the left being worse than the right.  She denies any prior surgeries, but does admit to some physical therapy in [place] years ago which she described as not having helped.  She denies any steroid injections or x-rays.  The patient's sixth area of pain is that of the neck, bilaterally, with the right being worse than the left.  She  denies any cervical spine surgeries, nerve blocks, physical therapy, or x-rays.  The patient's seventh area of pain is that of the knees, bilaterally, with the left being worse than the right.  She apparently had a left total knee replacement done in [place] and not only did not help but it made things worse.  She admits to having had some physical therapy for the  neck pain that lasted for several months, but it did not help.  She denies any recent x-rays, nerve blocks, or joint injections.  The patient's eigth area of pain is that of the hips, bilaterally, with the left being worse than the right, she apparently had a bilateral total hip replacement with the replacement having been done in [place] and another one here by Dr. Rudene Christians.  The patient indicates having had some x-rays done at the orthopedic clinic but she denies any nerve blocks or joint injections.  She also denied any physical therapy.  The patient is being referred to [place] after having had her injections done by Dr. Sharlet Salina.  Today we have reminded the patient that our specialty is interventional pain management and therefore it is difficult for me to understand why she has been sent to [place] if she has already gone to Dr. Sharlet Salina.  She indicated that she had injections done by Dr. Sharlet Salina but not only did they not help but they were very painful.  She also indicated that she was told that she needed to see [place] since she needed to be with somebody that knows more about pain management than Dr. Sharlet Salina.  Interestingly, when I first started talking to her, I asked her about her problems, to which she answered: Which problems? My physical problems or my mental problems?  I did enter a referral for a psychiatric evaluation and treatment since I will not be managing that part of her care.  Today, in an attempt to speed up things, we had her husband come into the room so that he could hear everything and make sure that between the 2 could keep things straight.  I went over each one of the labs and each one of the x-rays and I explained them in layman's terms.  I was very clear that I would not be handling anything other than pain and even after I have done that, he then wanted me to treat her constipation, which does not appear to be related to any pain medication since she appears not to be taking any.  In any  case, I pointed out that the constipation along with everything else not directly related to the pain should be addressed with her primary care physician or not with me.  After I completed explaining to both of them all my findings, I told him that the reason why we create the list of different pains and we put him in order is so that we can then select the order in which we will be treating this.  Since she had indicated that her back pain was the worst, then I told him that we would probably get started with diagnostic bilateral lumbar facet blocks.  However, as soon as I set this, then her husband and her got into a discussion as to what her worst pain was and then eventually she indicated that it was her left shoulder.  Once again, I was very clear to them that although I thought I may be able to offer her some things to manage her pain, I really need  for her to be seen by a psychiatrist to get this issue a flight of ideas and inconsistencies out of the way so that we can begin treating one of her pains without "flip-flopping".  Eventually they decided that it was her left shoulder that needed to be treated first.  Therefore I have entered an order for a diagnostic left suprascapular nerve block.  I did go over per pain diary and how it should be completed, but I am pretty sure that we will need to do that several times again.  In considering the treatment plan options, Ms. Jodi Parrish was reminded that I no longer take patients for medication management only. I asked her to let me know if she had no intention of taking advantage of the interventional therapies, so that we could make arrangements to provide this space to someone interested. I also made it clear that undergoing interventional therapies for the purpose of getting pain medications is very inappropriate on the part of a patient, and it will not be tolerated in this practice. This type of behavior would suggest true addiction and therefore it requires  referral to an addiction specialist.   Further details on both, my assessment(s), as well as the proposed treatment plan, please see below.  Controlled Substance Pharmacotherapy Assessment REMS (Risk Evaluation and Mitigation Strategy)  Analgesic: Tramadol 50 mg 1 tablet p.o. twice daily last prescribed on 08/09/2018 Highest recorded MME/day: 10 mg/day MME/day: 0 mg/day  Pill Count: None expected due to no prior prescriptions written by our practice. Hart Rochester, RN  04/13/2020  9:49 AM  Sign when Signing Visit Safety precautions to be maintained throughout the outpatient stay will include: orient to surroundings, keep bed in low position, maintain call bell within reach at all times, provide assistance with transfer out of bed and ambulation.    Pharmacokinetics: Liberation and absorption (onset of action): WNL Distribution (time to peak effect): WNL Metabolism and excretion (duration of action): WNL         Pharmacodynamics: Desired effects: Analgesia: Ms. Jodi Parrish reports >50% benefit. Functional ability: Patient reports that medication allows her to accomplish basic ADLs Clinically meaningful improvement in function (CMIF): Sustained CMIF goals met Perceived effectiveness: Described as relatively effective, allowing for increase in activities of daily living (ADL) Undesirable effects: Side-effects or Adverse reactions: None reported Monitoring:  PMP: PDMP reviewed during this encounter. Online review of the past 72-monthperiod previously conducted. Not applicable at this point since we have not taken over the patient's medication management yet. List of other Serum/Urine Drug Screening Test(s):  Lab Results  Component Value Date   COCAINSCRNUR NEGATIVE 09/18/2013   TSuperiorNEGATIVE 09/18/2013   List of all UDS test(s) done:  Lab Results  Component Value Date   SUMMARY Note 03/12/2020   Last UDS on record: Summary  Date Value Ref Range Status  03/12/2020 Note   Corrected    Comment:    ==================================================================== Compliance Drug Analysis, Ur ==================================================================== Test                             Result       Flag       Units  Drug Present and Declared for Prescription Verification   Gabapentin                     PRESENT      EXPECTED   Lamotrigine  PRESENT      EXPECTED   Citalopram                     PRESENT      EXPECTED   Desmethylcitalopram            PRESENT      EXPECTED    Desmethylcitalopram is an expected metabolite of citalopram or the    enantiomeric form, escitalopram.    Trazodone                      PRESENT      EXPECTED   1,3 chlorophenyl piperazine    PRESENT      EXPECTED    1,3-chlorophenyl piperazine is an expected metabolite of trazodone.    Propranolol                    PRESENT      EXPECTED  Drug Absent but Declared for Prescription Verification   Amphetamine                    Not Detected UNEXPECTED ng/mg creat   Temazepam                      Not Detected UNEXPECTED ng/mg creat ==================================================================== Test                      Result    Flag   Units      Ref Range   Creatinine              99               mg/dL      >=20 ==================================================================== Declared Medications:  The flagging and interpretation on this report are based on the  following declared medications.  Unexpected results may arise from  inaccuracies in the declared medications.   **Note: The testing scope of this panel includes these medications:   Amphetamine (Adderall)  Citalopram (Celexa)  Gabapentin (Neurontin)  Lamotrigine (Lamictal)  Propranolol (Inderal)  Temazepam (Restoril)  Trazodone (Desyrel)   **Note: The testing scope of this panel does not include the  following reported medications:   Cyanocobalamin  Fish Oil  Folic Acid   Iron  Lisinopril (Zestril)  Magnesium  Meloxicam (Mobic)  Multivitamin  Olmesartan (Benicar)  Pravastatin (Pravachol)  Probiotic  Spironolactone (Aldactone)  Topical  Vitamin D3 ==================================================================== For clinical consultation, please call [phone]. ====================================================================    UDS interpretation: No unexpected findings.          Medication Assessment Form: Patient introduced to form today Treatment compliance: Treatment may start today if patient agrees with proposed plan. Evaluation of compliance is not applicable at this point Risk Assessment Profile: Aberrant behavior: See initial evaluations. None observed or detected today Comorbid factors increasing risk of overdose: See initial evaluation. No additional risks detected today Opioid risk tool (ORT):  Opioid Risk  01/28/2020  Alcohol 1  Illegal Drugs 0  Rx Drugs 0  Alcohol 0  Illegal Drugs 0  Rx Drugs 0  Psychological Disease 2  OCD Negative  Bipolar Positive  Depression 1  Opioid Risk Tool Scoring 4  Opioid Risk Interpretation Moderate Risk    ORT Scoring interpretation table:  Score <3 = Low Risk for SUD  Score between 4-7 = Moderate Risk for SUD  Score >8 = High Risk for Opioid Abuse   Risk of substance  use disorder (SUD): Low  Risk Mitigation Strategies:  Patient opioid safety counseling: No controlled substances prescribed. Patient-Prescriber Agreement (PPA): No agreement signed.  Controlled substance notification to other providers: None required. No opioid therapy.  Pharmacologic Plan: No opioid analgesic prescribed.             Laboratory Chemistry Profile   Renal Lab Results  Component Value Date   BUN 13 03/17/2020   CREATININE 0.94 03/17/2020   BCR 16 03/12/2020   GFRAA >60 03/17/2020   GFRNONAA >60 03/17/2020   PROTEINUR NEGATIVE 12/18/2019     Electrolytes Lab Results  Component Value Date    NA 140 03/17/2020   K 4.1 03/17/2020   CL 101 03/17/2020   CALCIUM 9.8 03/17/2020   MG 2.4 (H) 03/12/2020     Hepatic Lab Results  Component Value Date   AST 13 (L) 03/17/2020   ALT 11 03/17/2020   ALBUMIN 4.3 03/17/2020   ALKPHOS 76 03/17/2020   LIPASE 27 03/17/2020   AMMONIA 12 04/06/2017     ID Lab Results  Component Value Date   STAPHAUREUS NEGATIVE 01/04/2017   MRSAPCR NEGATIVE 01/04/2017     Bone Lab Results  Component Value Date   25OHVITD1 44 03/12/2020   25OHVITD2 <1.0 03/12/2020   25OHVITD3 44 03/12/2020     Endocrine Lab Results  Component Value Date   GLUCOSE 109 (H) 03/17/2020   GLUCOSEU NEGATIVE 12/18/2019   TSH 0.95 09/18/2013     Neuropathy Lab Results  Component Value Date   VITAMINB12 636 03/12/2020     CNS No results found for: COLORCSF, APPEARCSF, RBCCOUNTCSF, WBCCSF, POLYSCSF, LYMPHSCSF, EOSCSF, PROTEINCSF, GLUCCSF, JCVIRUS, CSFOLI, IGGCSF, LABACHR, ACETBL, LABACHR, ACETBL   Inflammation (CRP: Acute  ESR: Chronic) Lab Results  Component Value Date   CRP 12 (H) 03/12/2020   ESRSEDRATE 23 03/12/2020     Rheumatology No results found for: RF, ANA, LABURIC, URICUR, LYMEIGGIGMAB, LYMEABIGMQN, HLAB27   Coagulation Lab Results  Component Value Date   INR 0.99 01/04/2017   LABPROT 13.1 01/04/2017   APTT 26 01/04/2017   PLT 192 03/17/2020     Cardiovascular Lab Results  Component Value Date   CKTOTAL 44 12/18/2019   CKMB 0.5 09/18/2013   TROPONINI <0.03 10/04/2018   HGB 12.6 03/17/2020   HCT 39.1 03/17/2020     Screening Lab Results  Component Value Date   STAPHAUREUS NEGATIVE 01/04/2017   MRSAPCR NEGATIVE 01/04/2017     Cancer No results found for: CEA, CA125, LABCA2   Allergens No results found for: ALMOND, APPLE, ASPARAGUS, AVOCADO, BANANA, BARLEY, BASIL, BAYLEAF, GREENBEAN, LIMABEAN, WHITEBEAN, BEEFIGE, REDBEET, BLUEBERRY, BROCCOLI, CABBAGE, MELON, CARROT, CASEIN, CASHEWNUT, CAULIFLOWER, CELERY     Note: Lab  results reviewed.   Recent Diagnostic Imaging Review  Cervical Imaging: Cervical CT wo contrast: Results for orders placed during the hospital encounter of 12/18/19 CT Cervical Spine Wo Contrast  Narrative CLINICAL DATA:  Head trauma  EXAM: CT HEAD WITHOUT CONTRAST  CT CERVICAL SPINE WITHOUT CONTRAST  TECHNIQUE: Multidetector CT imaging of the head and cervical spine was performed following the standard protocol without intravenous contrast. Multiplanar CT image reconstructions of the cervical spine were also generated.  COMPARISON:  10/04/2018  FINDINGS: CT HEAD FINDINGS  Brain: There is no mass, hemorrhage or extra-axial collection. The size and configuration of the ventricles and extra-axial CSF spaces are normal. The brain parenchyma is normal, without evidence of acute or chronic infarction.  Vascular: No abnormal hyperdensity of the major intracranial arteries  or dural venous sinuses. No intracranial atherosclerosis.  Skull: The visualized skull base, calvarium and extracranial soft tissues are normal.  Sinuses/Orbits: No fluid levels or advanced mucosal thickening of the visualized paranasal sinuses. No mastoid or middle ear effusion. The orbits are normal.  CT CERVICAL SPINE FINDINGS  Alignment: Reversal of normal cervical lordosis may be positional or due to muscle spasm.  Skull base and vertebrae: No acute fracture.  Soft tissues and spinal canal: No prevertebral fluid or swelling. No visible canal hematoma.  Disc levels: No advanced spinal canal or neural foraminal stenosis.  Upper chest: No pneumothorax, pulmonary nodule or pleural effusion.  Other: Normal visualized paraspinal cervical soft tissues.  IMPRESSION: 1. Normal head CT. 2. No acute fracture or static subluxation of the cervical spine. 3. Reversal of normal cervical lordosis may be positional or due to muscle spasm.   Electronically Signed By: Ulyses Jarred M.D. On: 12/18/2019  22:01  Cervical DG Bending/F/E views: Results for orders placed during the hospital encounter of 03/12/20 DG Cervical Spine With Flex & Extend  Narrative CLINICAL DATA:  Cervicalgia  EXAM: CERVICAL SPINE COMPLETE WITH FLEXION AND EXTENSION VIEWS  COMPARISON:  12/18/2019  FINDINGS: Frontal, bilateral oblique, lateral, flexion, and extension views of the cervical spine are obtained. There is reversal of cervical lordosis centered at C3/C4, stable since prior exam. There are no acute displaced fractures. There is significant spondylosis from C3 through C7, with disc space narrowing and prominent anterior osteophyte formation. There is minimal left predominant neural foraminal encroachment at C4-5 and C5-6. No instability with flexion or extension. Prevertebral soft tissues are unremarkable. Lung apices are clear.  IMPRESSION: 1. Stable multilevel cervical spondylosis and mild kyphosis. No acute bony abnormality.   Electronically Signed By: Randa Ngo M.D. On: 03/13/2020 19:41  Shoulder Imaging: Shoulder-R DG: Results for orders placed during the hospital encounter of 03/12/20 DG Shoulder Right  Narrative CLINICAL DATA:  Right shoulder pain.  No trauma history submitted.  EXAM: RIGHT SHOULDER - 2+ VIEW  COMPARISON:  None.  FINDINGS: Degenerative changes of the glenohumeral and acromioclavicular joints are relatively mild. No acute fracture or dislocation. Visualized portion of the right hemithorax is normal.  IMPRESSION: Degenerative change, without acute osseous finding.   Electronically Signed By: Abigail Miyamoto M.D. On: 03/13/2020 16:55  Shoulder-L DG: Results for orders placed during the hospital encounter of 03/12/20 DG Shoulder Left  Narrative CLINICAL DATA:  Left shoulder pain  EXAM: LEFT SHOULDER - 2+ VIEW  COMPARISON:  12/18/2019  FINDINGS: Frontal, axillary, and transscapular views of the left shoulder demonstrate stable severe  glenohumeral osteoarthritis with complete loss of joint space, bony remodeling of the humeral head and glenoid fossa, and significant osteophyte formation all unchanged. No fracture, subluxation, or dislocation. Left chest is clear. Stable calcified mediastinal and hilar lymph nodes.  IMPRESSION: 1. Severe glenohumeral osteoarthritis unchanged since prior exam. No acute bony abnormality.   Electronically Signed By: Randa Ngo M.D. On: 03/13/2020 19:39  Thoracic Imaging: Thoracic DG 2-3 views: Results for orders placed during the hospital encounter of 03/12/20 DG Thoracic Spine 2 View  Narrative CLINICAL DATA:  Upper back and thoracic spine pain  EXAM: THORACIC SPINE 2 VIEWS  COMPARISON:  10/04/2018  FINDINGS: Frontal and lateral views of the thoracic spine are obtained. Persistent right convex scoliosis again identified centered at approximately the T8 level. Otherwise alignment is anatomic. No acute fractures. There is multilevel disc space narrowing and osteophyte formation throughout the mid and lower thoracic spine  unchanged. Paraspinal soft tissues are normal. Calcified mediastinal lymph nodes are unchanged.  IMPRESSION: 1. Stable right convex scoliosis and multilevel lower thoracic spondylosis. No acute bony abnormality.   Electronically Signed By: Randa Ngo M.D. On: 03/13/2020 19:42  Lumbosacral Imaging: Lumbar MR wo contrast: Results for orders placed during the hospital encounter of 05/17/18 MR LUMBAR SPINE WO CONTRAST  Narrative CLINICAL DATA:  Chronic low back pain.  EXAM: MRI LUMBAR SPINE WITHOUT CONTRAST  TECHNIQUE: Multiplanar, multisequence MR imaging of the lumbar spine was performed. No intravenous contrast was administered.  COMPARISON:  None.  FINDINGS: Segmentation: There is transitional lumbosacral anatomy. The transitional segment will be considered a partially sacralized L5 with a left-sided assimilation joint with the  sacrum. The L5-S1 disc is fully formed. T12 ribs are rudimentary or absent.  Alignment: Moderate lumbar levoscoliosis with apex at L3. Trace retrolisthesis of L1 on L2.  Vertebrae: No fracture or suspicious osseous lesion. Severe right-sided disc space narrowing from L1-2 to L3-4 with associated degenerative endplate changes including mild endplate edema at K5-9. Milder disc space narrowing at T11-12, T12-L1, and L4-5. Small a may angioma in the left L3 pedicle.  Conus medullaris and cauda equina: Conus extends to the L1-2 level. Conus and cauda equina appear normal.  Paraspinal and other soft tissues: Multiple T2 hyperintense left renal lesions measuring up to 2.7 cm, likely cysts. 6 mm T1 hyperintense and T2 hypointense left upper pole renal lesion, possibly a proteinaceous or hemorrhagic cyst.  Disc levels:  Disc desiccation throughout the lumbar spine with exception of L5-S1.  T11-12: Disc bulging greater to the left with small superimposed central and left foraminal disc protrusions and mild facet arthrosis resulting in mild left neural foraminal stenosis. No spinal stenosis.  T12-L1: Mild disc bulging and mild facet hypertrophy without stenosis.  L1-2: Mild disc bulging and mild facet and ligamentum flavum hypertrophy result in mild spinal stenosis without significant neural foraminal stenosis.  L2-3: Disc bulging and spurring asymmetric to the right and mild facet and ligamentum flavum hypertrophy result in borderline spinal and right lateral recess stenosis and mild right neural foraminal stenosis.  L3-4: Disc bulging and spurring asymmetric to the right and moderate facet and ligamentum flavum hypertrophy result in mild right lateral recess stenosis and mild right neural foraminal stenosis without significant spinal stenosis.  L4-5: Disc bulging slightly greater to the left, broad left subarticular disc protrusion, facet hypertrophy, and mild right and severe  left facet arthrosis result in mild spinal stenosis, mild left lateral recess stenosis, and mild right neural foraminal stenosis. A 2.3 x 1.1 cm T2 hyperintense cystic lesion is present posteriorly in the left neural foramen extending into the extraforaminal space with severe narrowing of the foramen and displacement of the exiting L4 nerve.  L5-S1: Right facet arthrosis.  No disc herniation or stenosis.  IMPRESSION: 1. Transitional lumbosacral anatomy as above. 2. 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 3. 2 cm cyst in and lateral to the left neural foramen at L4-5, possibly a synovial cyst given the marked facet degeneration at this level. An incidental large perineural cyst is a differential consideration. Correlate for left L4 (or L5 given transitional anatomy) radiculopathy. 4. Mild spinal and left lateral recess stenosis at L4-5. 5. Mild spinal stenosis at L1-2.  Electronically Signed By: Logan Bores M.D. On: 05/18/2018 10:46  Lumbar DG Bending views: Results for orders placed during the hospital encounter of 03/12/20 DG Lumbar Spine Complete W/Bend  Narrative CLINICAL DATA:  Low back pain  EXAM: LUMBAR SPINE - COMPLETE WITH BENDING VIEWS  COMPARISON:  [facility]  FINDINGS: Frontal, bilateral oblique, lateral, flexion, and extension views of the lumbar spine are obtained. There is hemi sacralization of the L5 vertebral body on the left. Stable left convex scoliosis centered at L2/L3. No acute displaced fractures. Multilevel lumbar spondylosis is most pronounced at the thoracolumbar junction. There is diffuse facet hypertrophy greatest at L4-5 and L5-S1. No instability with flexion or extension. Sacroiliac joints are normal.  IMPRESSION: 1. Stable left convex scoliosis. 2. Prominent spondylosis at the thoracolumbar junction. 3. Prominent facet hypertrophy at the lumbosacral junction. 4. No acute bony abnormality.  No evidence of  instability.   Electronically Signed By: Randa Ngo M.D. On: 03/13/2020 19:36  Hip Imaging: Hip-R DG 2-3 views: Results for orders placed during the hospital encounter of 01/10/17 DG HIP UNILAT W OR W/O PELVIS 2-3 VIEWS RIGHT  Narrative CLINICAL DATA:  Right total hip replacement  EXAM: DG HIP (WITH OR WITHOUT PELVIS) 2-3V RIGHT  COMPARISON:  None.  FINDINGS: Changes of right total hip replacement. Normal alignment. No hardware or bony complicating feature.  IMPRESSION: Right hip replacement.  No complicating feature.   Electronically Signed By: Rolm Baptise M.D. On: 01/10/2017 09:51  Knee Imaging: Knee-R DG 1-2 views: Results for orders placed during the hospital encounter of 03/12/20 DG Knee 1-2 Views Right  Narrative CLINICAL DATA:  Right knee pain  EXAM: RIGHT KNEE - 1-2 VIEW  COMPARISON:  None.  FINDINGS: Frontal and lateral views of the right knee demonstrate 3 compartmental osteoarthritis, moderate in severity and most pronounced in the medial compartment. No fracture, subluxation, or dislocation. There is chondrocalcinosis seen in the lateral compartment. No joint effusion.  IMPRESSION: 1. Moderate 3 compartmental osteoarthritis greatest medially.   Electronically Signed By: Randa Ngo M.D. On: 03/13/2020 19:42  Knee-L DG 1-2 views: Results for orders placed during the hospital encounter of 03/12/20 DG Knee 1-2 Views Left  Narrative CLINICAL DATA:  Left knee pain  EXAM: LEFT KNEE - 1-2 VIEW  COMPARISON:  None.  FINDINGS: Frontal and lateral views of the left knee demonstrate 3 component left knee arthroplasty in the expected position with no evidence of complication. No acute bony abnormalities. Trace suprapatellar joint effusion. Soft tissues are normal.  IMPRESSION: 1. Unremarkable left knee arthroplasty. 2. Trace left knee effusion.   Electronically Signed By: Randa Ngo M.D. On: 03/13/2020 19:43  Foot  Imaging: Foot-R DG Complete: Results for orders placed during the hospital encounter of 03/12/20 DG Foot Complete Right  Narrative CLINICAL DATA:  Chronic bilateral foot pain  EXAM: RIGHT FOOT COMPLETE - 3+ VIEW  COMPARISON:  None.  FINDINGS: Frontal, oblique, lateral views of the right foot are obtained. There are no acute fractures. There is moderate midfoot osteoarthritis with joint space narrowing and osteophyte formation greatest at the first and second tarsometatarsal joints. Hammertoe deformities are seen of the second through fourth digits. Soft tissues are grossly unremarkable.  IMPRESSION: 1. Moderate midfoot osteoarthritis.   Electronically Signed By: Randa Ngo M.D. On: 03/13/2020 19:38  Foot-L DG Complete: Results for orders placed during the hospital encounter of 03/12/20 DG Foot Complete Left  Narrative CLINICAL DATA:  Chronic bilateral foot pain  EXAM: LEFT FOOT - COMPLETE 3+ VIEW  COMPARISON:  None.  FINDINGS: Frontal, oblique, and lateral views of the left foot are obtained. There are no acute displaced fractures. There is moderate osteoarthritis at the tarsometatarsal joints, with joint  space narrowing and osteophyte formation. Mild hammertoe deformities are seen of the second through fourth digits. Soft tissues are unremarkable.  IMPRESSION: 1. Moderate midfoot osteoarthritis.   Electronically Signed By: Randa Ngo M.D. On: 03/13/2020 19:37  Complexity Note: Imaging results reviewed. Results shared with [name], using Layman's terms.                        Meds   Current Outpatient Medications:  .  bisacodyl (DULCOLAX) 10 MG suppository, Place 1 suppository (10 mg total) rectally as needed for moderate constipation., Disp: 12 suppository, Rfl: 0 .  Cholecalciferol (VITAMIN D-3) 5000 units TABS, Take 1 tablet by mouth daily., Disp: , Rfl:  .  citalopram (CELEXA) 20 MG tablet, Take 20 mg by mouth daily., Disp: , Rfl:  .   folic acid (FOLVITE) 578 MCG tablet, Take 800 mcg by mouth daily., Disp: , Rfl:  .  gabapentin (NEURONTIN) 300 MG capsule, Take 300 mg by mouth 3 (three) times daily., Disp: , Rfl:  .  lamoTRIgine (LAMICTAL) 200 MG tablet, Take 200 mg by mouth at bedtime., Disp: , Rfl:  .  Multiple Vitamins-Minerals (MULTIVITAMIN WITH MINERALS) tablet, Take 1 tablet by mouth daily., Disp: , Rfl:  .  neomycin-polymyxin-dexameth (MAXITROL) 0.1 % OINT, 1 application., Disp: , Rfl:  .  polyethylene glycol (MIRALAX) 17 g packet, Take 17 g by mouth daily., Disp: 14 each, Rfl: 0 .  pravastatin (PRAVACHOL) 40 MG tablet, Take 40 mg by mouth at bedtime., Disp: , Rfl:  .  propranolol ER (INDERAL LA) 60 MG 24 hr capsule, Take 60 mg by mouth daily., Disp: , Rfl:  .  spironolactone (ALDACTONE) 100 MG tablet, Take 100 mg by mouth at bedtime., Disp: , Rfl:  .  traZODone (DESYREL) 100 MG tablet, Take 200 mg by mouth at bedtime., Disp: , Rfl:  .  vitamin B-12 (CYANOCOBALAMIN) 1000 MCG tablet, Take 1,000 mcg by mouth daily., Disp: , Rfl:  .  dicyclomine (BENTYL) 10 MG capsule, Take 1 capsule (10 mg total) by mouth 4 (four) times daily for 14 days., Disp: 56 capsule, Rfl: 0  ROS  Constitutional: Denies any fever or chills Gastrointestinal: No reported hemesis, hematochezia, vomiting, or acute GI distress Musculoskeletal: Denies any acute onset joint swelling, redness, loss of ROM, or weakness Neurological: No reported episodes of acute onset apraxia, aphasia, dysarthria, agnosia, amnesia, paralysis, loss of coordination, or loss of consciousness  Allergies  [name] is allergic to budesonide-formoterol fumarate, abilify [aripiprazole], and duloxetine.  PFSH  Drug: Ms. Jodi Parrish  reports no history of drug use. Alcohol:  reports no history of alcohol use. Tobacco:  reports that she has quit smoking. Her smoking use included cigarettes. She has never used smokeless tobacco. Medical:  has a past medical history of Ankle  fracture, Anxiety, Arthritis, Bipolar disorder ([place]), Depression, Diverticulitis, Hyperlipidemia, Hypertension, Osteoarthritis, and Overactive bladder. Surgical: Ms. Kaedynce Tapp  has a past surgical history that includes Bartholin gland cyst excision (1976); Pelvic floor repair (2009); Elbow surgery (Right, 1995); Total hip arthroplasty (Left, 2008); Replacement total knee (Left, 2016); Colonoscopy; Abdominal hysterectomy (1998); Tonsillectomy (1958); Knee surgery (Left, 2000); Total hip arthroplasty (Right, 01/10/2017); Colonoscopy with propofol (N/A, 04/10/2018); Esophagogastroduodenoscopy (egd) with propofol (N/A, 04/10/2018); Joint replacement (Left, 2011); and Joint replacement (Right, 2009). Family: family history includes CAD in her father; Hypertension in her father; Parkinson's disease in her mother.  Constitutional Exam  General appearance: Well nourished, well developed, and well  hydrated. In no apparent acute distress Vitals:   04/13/20 0950  BP: (!) 141/101  Pulse: 69  Resp: 18  Temp: 97.6 F (36.4 C)  TempSrc: Temporal  Weight: 160 lb (72.6 kg)  Height: 5' 8"  (1.727 m)   BMI Assessment: Estimated body mass index is 24.33 kg/m as calculated from the following:   Height as of this encounter: 5' 8"  (1.727 m).   Weight as of this encounter: 160 lb (72.6 kg).  BMI interpretation table: BMI level Category Range association with higher incidence of chronic pain  <18 kg/m2 Underweight   18.5-24.9 kg/m2 Ideal body weight   25-29.9 kg/m2 Overweight Increased incidence by 20%  30-34.9 kg/m2 Obese (Class I) Increased incidence by 68%  35-39.9 kg/m2 Severe obesity (Class II) Increased incidence by 136%  >40 kg/m2 Extreme obesity (Class III) Increased incidence by 254%   Patient's current BMI Ideal Body weight  Body mass index is 24.33 kg/m. Ideal body weight: 63.9 kg (140 lb 14 oz) Adjusted ideal body weight: 67.4 kg (148 lb 8.4 oz)   BMI Readings from Last 4 Encounters:  04/13/20  24.33 kg/m  03/17/20 25.54 kg/m  01/28/20 24.78 kg/m  12/18/19 25.37 kg/m   Wt Readings from Last 4 Encounters:  04/13/20 160 lb (72.6 kg)  03/17/20 168 lb (76.2 kg)  01/28/20 163 lb (73.9 kg)  12/18/19 162 lb (73.5 kg)    Assessment & Plan  Primary Diagnosis & Pertinent Problem List: The primary encounter diagnosis was Chronic pain syndrome. Diagnoses of Chronic low back pain (1ry area of Pain) (Bilateral) (R>L) w/o sciatica, Chronic feet pain (2ry area of Pain) (Bilateral) (R>L), Chronic shoulder pain (3ry area of Pain) (Bilateral) (L>R), Chronic upper extremity pain (4th area of Pain) (Bilateral) (L>R), Chronic mid back pain (5th area of Pain) (Bilateral) (L>R), Chronic neck pain (6th area of Pain) (Bilateral) (R>L), Pharmacologic therapy, Primary osteoarthritis involving multiple joints, Psychiatric disorder, Elevated C-reactive protein (CRP), Cervical spondylosis without myelopathy, DDD (degenerative disc disease), cervical, 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, 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), Kyphosis of cervical region, unspecified kyphosis type, Osteoarthritis of glenohumeral joints (Bilateral), Osteoarthritis of shoulder (SEVERE) (Left), Osteoarthritis of AC (acromioclavicular) joint (Right), Scoliosis of thoracic spine, unspecified scoliosis type, Thoracic spondylosis without myelopathy, DDD (degenerative disc disease), thoracic, DDD (degenerative disc disease), lumbosacral, Lumbar levoscoliosis, Lumbar Grade 1 Retrolisthesis of L1/L2, Severe Narrowing of lumbar intervertebral disc space from L1-4 (Right), Renal cyst, Abnormal MRI, lumbar spine (05/18/2018), Lumbosacral facet hypertrophy (Multilevel) (Bilateral), and Lumbosacral facet syndrome (Multilevel) (Bilateral) (L>R) were also pertinent to this visit.  Visit Diagnosis: 1. Chronic pain syndrome   2. Chronic low back pain (1ry area of Pain) (Bilateral) (R>L) w/o sciatica   3. Chronic feet pain (2ry area of Pain) (Bilateral) (R>L)   4. Chronic  shoulder pain (3ry area of Pain) (Bilateral) (L>R)   5. Chronic upper extremity pain (4th area of Pain) (Bilateral) (L>R)   6. Chronic mid back pain (5th area of Pain) (Bilateral) (L>R)   7. Chronic neck pain (6th area of Pain) (Bilateral) (R>L)   8. Pharmacologic therapy   9. Primary osteoarthritis involving multiple joints   10. Psychiatric disorder   11. Elevated C-reactive protein (CRP)   12. Cervical spondylosis without myelopathy   13. DDD (degenerative disc disease), cervical   14. 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 Kyphosis of cervical region, unspecified kyphosis type   16. Osteoarthritis of glenohumeral joints (Bilateral)   17. Osteoarthritis of shoulder (SEVERE) (Left)   18. Osteoarthritis of  AC (acromioclavicular) joint (Right)   19. Scoliosis of thoracic spine, unspecified scoliosis type   20. Thoracic spondylosis without myelopathy   21. DDD (degenerative disc disease), thoracic   22. DDD (degenerative disc disease), lumbosacral   23. Lumbar levoscoliosis   24. Lumbar Grade 1 Retrolisthesis of L1/L2   25. Severe Narrowing of lumbar intervertebral disc space from L1-4 (Right)   26. Renal cyst   27. Abnormal MRI, lumbar spine (05/18/2018)   28. Lumbosacral facet hypertrophy (Multilevel) (Bilateral)   29. 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    Thoracic Spondylosis Without Myelopathy  Ddd (Degenerative Disc Disease), Thoracic  Ddd (Degenerative Disc Disease),  Lumbosacral  Lumbar levoscoliosis   Moderate lumbar levoscoliosis with apex at L3   Lumbar Grade 1 Retrolisthesis of L1/L2  Severe Narrowing of lumbar intervertebral disc space from L1-4 (Right)  Abnormal MRI, lumbar spine (05/18/2018)   (05/18/2018) Lumbar MRI FINDINGS: Segmentation: There is transitional lumbosacral anatomy. The transitional segment will be considered a partially sacralized L5 with a left-sided assimilation joint with the sacrum. The L5-S1 disc is fully formed. T12 ribs are rudimentary or absent.  Alignment: Moderate lumbar levoscoliosis with apex at L3. Trace retrolisthesis of L1 on L2.  Vertebrae: No fracture or suspicious osseous lesion. Severe right-sided disc space narrowing from L1-2 to L3-4 with associated degenerative endplate changes including mild endplate edema at Z6-1. Milder disc space narrowing at T11-12, T12-L1, and L4-5. Small a may angioma in the left L3 pedicle.  Conus medullaris and cauda equina: Conus extends to the L1-2 level. Conus and cauda equina appear normal.  Paraspinal and other soft tissues: Multiple T2 hyperintense left renal lesions measuring up to 2.7 cm, likely cysts. 6 mm T1 hyperintense and T2 hypointense left upper pole renal lesion, possibly a proteinaceous or hemorrhagic cyst.  Disc levels: Disc desiccation throughout the lumbar spine with exception of L5-S1. T11-12: Disc bulging greater to the left with small superimposed central and left foraminal disc protrusions and mild facet arthrosis resulting in mild left neural foraminal stenosis. No spinal stenosis. T12-L1: Mild disc bulging and mild facet hypertrophy without stenosis. L1-2: Mild disc bulging and mild facet and ligamentum flavum hypertrophy result in mild spinal stenosis without significant neural foraminal stenosis. L2-3: Disc bulging and spurring asymmetric to the right and mild facet and ligamentum flavum hypertrophy result in borderline spinal and right lateral recess  stenosis and mild right neural foraminal stenosis. L3-4: Disc bulging and spurring asymmetric to the right and moderate facet and ligamentum flavum hypertrophy result in mild right lateral recess stenosis and mild right neural foraminal stenosis without significant spinal stenosis. L4-5: Disc bulging slightly greater to the left, broad left subarticular disc protrusion, facet hypertrophy, and mild right and severe left facet arthrosis result in mild spinal stenosis, mild left lateral recess stenosis, and mild right neural foraminal stenosis. A 2.3 x 1.1 cm T2 hyperintense cystic lesion is present posteriorly in the left neural foramen extending into the extraforaminal space with severe narrowing of the foramen and displacement of the exiting L4 nerve. L5-S1: Right facet arthrosis.  No disc herniation or stenosis.  IMPRESSION: 1. Transitional lumbosacral anatomy as above. 2. 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 3. 2 cm cyst in and lateral to the left neural foramen at L4-5, possibly a synovial cyst given the marked facet degeneration at this level. An incidental large perineural cyst is a differential consideration. Correlate for left L4 (or L5 given transitional anatomy) radiculopathy. 4. Mild spinal and left lateral recess stenosis at L4-5. 5. Mild spinal stenosis at L1-2.   Lumbosacral facet hypertrophy (Multilevel) (Bilateral)  Lumbosacral facet syndrome (Multilevel) (Bilateral) (L>R)  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)  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)  Osteoarthritis of shoulder (SEVERE) (Left)   Severe glenohumeral osteoarthritis with complete loss of joint space, bony remodeling of the humeral head and glenoid fossa, and significant osteophyte formation   Osteoarthritis of hip (Right)  Elevated C-Reactive Protein (Crp)  Renal Cyst   Multiple T2 hyperintense left renal lesions measuring up to 2.7 cm, likely  cysts. 6 mm T1 hyperintense and T2 hypointense left upper pole renal lesion, possibly a proteinaceous or hemorrhagic cyst.   Psychiatric Disorder    Plan of Care  Pharmacotherapy (Medications Ordered): No orders of the defined types were placed in this encounter.   Procedure Orders     SUPRASCAPULAR NERVE BLOCK Lab Orders  No laboratory test(s) ordered today   Imaging Orders  No imaging studies ordered today   Referral Orders  No referral(s) requested today    Pharmacological management options:  Opioid Analgesics: I will not be prescribing any opioids at this time.  Membrane stabilizer: None prescribed at this time Muscle relaxant: None prescribed at this time NSAID: None prescribed at this time Other analgesic(s): None prescribed at this time    Interventional management options: Planned, scheduled, and/or pending:    Diagnostic left suprascapular nerve block #1 under fluoroscopic guidance and IV sedation   Considering:   Diagnostic left suprascapular nerve block  Possible left suprascapular nerve RFA  Diagnostic bilateral lumbar facet blocks  Possible bilateral lumbar facet RFA    PRN Procedures:   None at this time    Provider-requested follow-up: Return for Procedure (w/ sedation): (L) SSNB #1. Recent Visits Date Type Provider Dept  01/29/20 Telemedicine Milinda Pointer, MD [facility]  Showing recent visits within past 90 days and meeting all other requirements Today's Visits Date Type Provider Dept  04/13/20 Office Visit Milinda Pointer, MD [facility]  Showing today's visits and meeting all other requirements Future Appointments Date Type Provider Dept  05/05/20 Appointment Milinda Pointer, MD [facility]  Showing future appointments within next 90 days and meeting all other requirements  Primary Care Physician: Kirk Ruths, MD Note by: Gaspar Cola, MD Date: 04/13/2020; Time: 4:13 PM

## 2020-04-13 ENCOUNTER — Encounter: Payer: Self-pay | Admitting: Pain Medicine

## 2020-04-13 ENCOUNTER — Ambulatory Visit: Payer: Medicare Other | Attending: Pain Medicine | Admitting: Pain Medicine

## 2020-04-13 ENCOUNTER — Other Ambulatory Visit: Payer: Self-pay

## 2020-04-13 VITALS — BP 141/101 | HR 69 | Temp 97.6°F | Resp 18 | Ht 68.0 in | Wt 160.0 lb

## 2020-04-13 DIAGNOSIS — M4180 Other forms of scoliosis, site unspecified: Secondary | ICD-10-CM | POA: Diagnosis present

## 2020-04-13 DIAGNOSIS — M150 Primary generalized (osteo)arthritis: Secondary | ICD-10-CM

## 2020-04-13 DIAGNOSIS — R7982 Elevated C-reactive protein (CRP): Secondary | ICD-10-CM

## 2020-04-13 DIAGNOSIS — Z79899 Other long term (current) drug therapy: Secondary | ICD-10-CM

## 2020-04-13 DIAGNOSIS — M8949 Other hypertrophic osteoarthropathy, multiple sites: Secondary | ICD-10-CM | POA: Insufficient documentation

## 2020-04-13 DIAGNOSIS — M19012 Primary osteoarthritis, left shoulder: Secondary | ICD-10-CM | POA: Diagnosis present

## 2020-04-13 DIAGNOSIS — M47817 Spondylosis without myelopathy or radiculopathy, lumbosacral region: Secondary | ICD-10-CM

## 2020-04-13 DIAGNOSIS — M542 Cervicalgia: Secondary | ICD-10-CM

## 2020-04-13 DIAGNOSIS — M5137 Other intervertebral disc degeneration, lumbosacral region: Secondary | ICD-10-CM | POA: Diagnosis present

## 2020-04-13 DIAGNOSIS — M5136 Other intervertebral disc degeneration, lumbar region: Secondary | ICD-10-CM | POA: Diagnosis present

## 2020-04-13 DIAGNOSIS — N281 Cyst of kidney, acquired: Secondary | ICD-10-CM

## 2020-04-13 DIAGNOSIS — M79672 Pain in left foot: Secondary | ICD-10-CM | POA: Insufficient documentation

## 2020-04-13 DIAGNOSIS — M79601 Pain in right arm: Secondary | ICD-10-CM | POA: Insufficient documentation

## 2020-04-13 DIAGNOSIS — R937 Abnormal findings on diagnostic imaging of other parts of musculoskeletal system: Secondary | ICD-10-CM

## 2020-04-13 DIAGNOSIS — M79671 Pain in right foot: Secondary | ICD-10-CM | POA: Diagnosis present

## 2020-04-13 DIAGNOSIS — M19011 Primary osteoarthritis, right shoulder: Secondary | ICD-10-CM

## 2020-04-13 DIAGNOSIS — G894 Chronic pain syndrome: Secondary | ICD-10-CM

## 2020-04-13 DIAGNOSIS — M4310 Spondylolisthesis, site unspecified: Secondary | ICD-10-CM | POA: Diagnosis present

## 2020-04-13 DIAGNOSIS — M5134 Other intervertebral disc degeneration, thoracic region: Secondary | ICD-10-CM | POA: Diagnosis present

## 2020-04-13 DIAGNOSIS — M545 Low back pain: Secondary | ICD-10-CM | POA: Diagnosis present

## 2020-04-13 DIAGNOSIS — M47814 Spondylosis without myelopathy or radiculopathy, thoracic region: Secondary | ICD-10-CM | POA: Insufficient documentation

## 2020-04-13 DIAGNOSIS — M549 Dorsalgia, unspecified: Secondary | ICD-10-CM

## 2020-04-13 DIAGNOSIS — F99 Mental disorder, not otherwise specified: Secondary | ICD-10-CM

## 2020-04-13 DIAGNOSIS — M47812 Spondylosis without myelopathy or radiculopathy, cervical region: Secondary | ICD-10-CM | POA: Diagnosis present

## 2020-04-13 DIAGNOSIS — M79602 Pain in left arm: Secondary | ICD-10-CM | POA: Insufficient documentation

## 2020-04-13 DIAGNOSIS — M4802 Spinal stenosis, cervical region: Secondary | ICD-10-CM

## 2020-04-13 DIAGNOSIS — M51369 Other intervertebral disc degeneration, lumbar region without mention of lumbar back pain or lower extremity pain: Secondary | ICD-10-CM

## 2020-04-13 DIAGNOSIS — M25512 Pain in left shoulder: Secondary | ICD-10-CM | POA: Diagnosis present

## 2020-04-13 DIAGNOSIS — M40202 Unspecified kyphosis, cervical region: Secondary | ICD-10-CM | POA: Diagnosis present

## 2020-04-13 DIAGNOSIS — M5030 Other cervical disc degeneration, unspecified cervical region: Secondary | ICD-10-CM

## 2020-04-13 DIAGNOSIS — M25511 Pain in right shoulder: Secondary | ICD-10-CM | POA: Insufficient documentation

## 2020-04-13 DIAGNOSIS — M159 Polyosteoarthritis, unspecified: Secondary | ICD-10-CM | POA: Insufficient documentation

## 2020-04-13 DIAGNOSIS — M419 Scoliosis, unspecified: Secondary | ICD-10-CM

## 2020-04-13 DIAGNOSIS — M51379 Other intervertebral disc degeneration, lumbosacral region without mention of lumbar back pain or lower extremity pain: Secondary | ICD-10-CM

## 2020-04-13 DIAGNOSIS — F5105 Insomnia due to other mental disorder: Secondary | ICD-10-CM | POA: Insufficient documentation

## 2020-04-13 DIAGNOSIS — G8929 Other chronic pain: Secondary | ICD-10-CM

## 2020-04-13 NOTE — Patient Instructions (Addendum)
____________________________________________________________________________________________  General Risks and Possible Complications  Patient Responsibilities: It is important that you read this as it is part of your informed consent. It is our duty to inform you of the risks and possible complications associated with treatments offered to you. It is your responsibility as a patient to read this and to ask questions about anything that is not clear or that you believe was not covered in this document.  Patient's Rights: You have the right to refuse treatment. You also have the right to change your mind, even after initially having agreed to have the treatment done. However, under this last option, if you wait until the last second to change your mind, you may be charged for the materials used up to that point.  Introduction: Medicine is not an exact science. Everything in Medicine, including the lack of treatment(s), carries the potential for danger, harm, or loss (which is by definition: Risk). In Medicine, a complication is a secondary problem, condition, or disease that can aggravate an already existing one. All treatments carry the risk of possible complications. The fact that a side effects or complications occurs, does not imply that the treatment was conducted incorrectly. It must be clearly understood that these can happen even when everything is done following the highest safety standards.  No treatment: You can choose not to proceed with the proposed treatment alternative. The "PRO(s)" would include: avoiding the risk of complications associated with the therapy. The "CON(s)" would include: not getting any of the treatment benefits. These benefits fall under one of three categories: diagnostic; therapeutic; and/or palliative. Diagnostic benefits include: getting information which can ultimately lead to improvement of the disease or symptom(s). Therapeutic benefits are those associated with the  successful treatment of the disease. Finally, palliative benefits are those related to the decrease of the primary symptoms, without necessarily curing the condition (example: decreasing the pain from a flare-up of a chronic condition, such as incurable terminal cancer).  General Risks and Complications: These are associated to most interventional treatments. They can occur alone, or in combination. They fall under one of the following six (6) categories: no benefit or worsening of symptoms; bleeding; infection; nerve damage; allergic reactions; and/or death. 1. No benefits or worsening of symptoms: In Medicine there are no guarantees, only probabilities. No healthcare provider can ever guarantee that a medical treatment will work, they can only state the probability that it may. Furthermore, there is always the possibility that the condition may worsen, either directly, or indirectly, as a consequence of the treatment. 2. Bleeding: This is more common if the patient is taking a blood thinner, either prescription or over the counter (example: Goody Powders, Fish oil, Aspirin, Garlic, etc.), or if suffering a condition associated with impaired coagulation (example: Hemophilia, cirrhosis of the liver, low platelet counts, etc.). However, even if you do not have one on these, it can still happen. If you have any of these conditions, or take one of these drugs, make sure to notify your treating physician. 3. Infection: This is more common in patients with a compromised immune system, either due to disease (example: diabetes, cancer, human immunodeficiency virus [HIV], etc.), or due to medications or treatments (example: therapies used to treat cancer and rheumatological diseases). However, even if you do not have one on these, it can still happen. If you have any of these conditions, or take one of these drugs, make sure to notify your treating physician. 4. Nerve Damage: This is more common when the   treatment is  an invasive one, but it can also happen with the use of medications, such as those used in the treatment of cancer. The damage can occur to small secondary nerves, or to large primary ones, such as those in the spinal cord and brain. This damage may be temporary or permanent and it may lead to impairments that can range from temporary numbness to permanent paralysis and/or brain death. 5. Allergic Reactions: Any time a substance or material comes in contact with our body, there is the possibility of an allergic reaction. These can range from a mild skin rash (contact dermatitis) to a severe systemic reaction (anaphylactic reaction), which can result in death. 6. Death: In general, any medical intervention can result in death, most of the time due to an unforeseen complication. ____________________________________________________________________________________________  ____________________________________________________________________________________________  Preparing for Procedure with Sedation  Procedure appointments are limited to planned procedures: . No Prescription Refills. . No disability issues will be discussed. . No medication changes will be discussed.  Instructions: . Oral Intake: Do not eat or drink anything for at least 8 hours prior to your procedure. (Exception: Blood Pressure Medication. See below.) . Transportation: Unless otherwise stated by your physician, you may drive yourself after the procedure. . Blood Pressure Medicine: Do not forget to take your blood pressure medicine with a sip of water the morning of the procedure. If your Diastolic (lower reading)is above 100 mmHg, elective cases will be cancelled/rescheduled. . Blood thinners: These will need to be stopped for procedures. Notify our staff if you are taking any blood thinners. Depending on which one you take, there will be specific instructions on how and when to stop it. . Diabetics on insulin: Notify the staff  so that you can be scheduled 1st case in the morning. If your diabetes requires high dose insulin, take only  of your normal insulin dose the morning of the procedure and notify the staff that you have done so. . Preventing infections: Shower with an antibacterial soap the morning of your procedure. . Build-up your immune system: Take 1000 mg of Vitamin C with every meal (3 times a day) the day prior to your procedure. . Antibiotics: Inform the staff if you have a condition or reason that requires you to take antibiotics before dental procedures. . Pregnancy: If you are pregnant, call and cancel the procedure. . Sickness: If you have a cold, fever, or any active infections, call and cancel the procedure. . Arrival: You must be in the facility at least 30 minutes prior to your scheduled procedure. . Children: Do not bring children with you. . Dress appropriately: Bring dark clothing that you would not mind if they get stained. . Valuables: Do not bring any jewelry or valuables.  Reasons to call and reschedule or cancel your procedure: (Following these recommendations will minimize the risk of a serious complication.) . Surgeries: Avoid having procedures within 2 weeks of any surgery. (Avoid for 2 weeks before or after any surgery). . Flu Shots: Avoid having procedures within 2 weeks of a flu shots or . (Avoid for 2 weeks before or after immunizations). . Barium: Avoid having a procedure within 7-10 days after having had a radiological study involving the use of radiological contrast. (Myelograms, Barium swallow or enema study). . Heart attacks: Avoid any elective procedures or surgeries for the initial 6 months after a "Myocardial Infarction" (Heart Attack). . Blood thinners: It is imperative that you stop these medications before procedures. Let us know if you   if you take any blood thinner.  . Infection: Avoid procedures during or within two weeks of an infection (including chest colds or  gastrointestinal problems). Symptoms associated with infections include: Localized redness, fever, chills, night sweats or profuse sweating, burning sensation when voiding, cough, congestion, stuffiness, runny nose, sore throat, diarrhea, nausea, vomiting, cold or Flu symptoms, recent or current infections. It is specially important if the infection is over the area that we intend to treat. Marland Kitchen. Heart and lung problems: Symptoms that may suggest an active cardiopulmonary problem include: cough, chest pain, breathing difficulties or shortness of breath, dizziness, ankle swelling, uncontrolled high or unusually low blood pressure, and/or palpitations. If you are experiencing any of these symptoms, cancel your procedure and contact your primary care physician for an evaluation.  Remember:  Regular Business hours are:  Monday to Thursday 8:00 AM to 4:00 PM  Provider's Schedule: Delano MetzFrancisco Adalid Beckmann, MD:  Procedure days: Tuesday and Thursday 7:30 AM to 4:00 PM  Edward JollyBilal Lateef, MD:  Procedure days: Monday and Wednesday 7:30 AM to 4:00 PM ____________________________________________________________________________________________   GENERAL RISKS AND COMPLICATIONS  What are the risk, side effects and possible complications? Generally speaking, most procedures are safe.  However, with any procedure there are risks, side effects, and the possibility of complications.  The risks and complications are dependent upon the sites that are lesioned, or the type of nerve block to be performed.  The closer the procedure is to the spine, the more serious the risks are.  Great care is taken when placing the radio frequency needles, block needles or lesioning probes, but sometimes complications can occur. 1. Infection: Any time there is an injection through the skin, there is a risk of infection.  This is why sterile conditions are used for these blocks.  There are four possible types of infection. 1. Localized skin  infection. 2. Central Nervous System Infection-This can be in the form of Meningitis, which can be deadly. 3. Epidural Infections-This can be in the form of an epidural abscess, which can cause pressure inside of the spine, causing compression of the spinal cord with subsequent paralysis. This would require an emergency surgery to decompress, and there are no guarantees that the patient would recover from the paralysis. 4. Discitis-This is an infection of the intervertebral discs.  It occurs in about 1% of discography procedures.  It is difficult to treat and it may lead to surgery.        2. Pain: the needles have to go through skin and soft tissues, will cause soreness.       3. Damage to internal structures:  The nerves to be lesioned may be near blood vessels or    other nerves which can be potentially damaged.       4. Bleeding: Bleeding is more common if the patient is taking blood thinners such as  aspirin, Coumadin, Ticiid, Plavix, etc., or if he/she have some genetic predisposition  such as hemophilia. Bleeding into the spinal canal can cause compression of the spinal  cord with subsequent paralysis.  This would require an emergency surgery to  decompress and there are no guarantees that the patient would recover from the  paralysis.       5. Pneumothorax:  Puncturing of a lung is a possibility, every time a needle is introduced in  the area of the chest or upper back.  Pneumothorax refers to free air around the  collapsed lung(s), inside of the thoracic cavity (chest cavity).  Another two  possible  complications related to a similar event would include: Hemothorax and Chylothorax.   These are variations of the Pneumothorax, where instead of air around the collapsed  lung(s), you may have blood or chyle, respectively.       6. Spinal headaches: They may occur with any procedures in the area of the spine.       7. Persistent CSF (Cerebro-Spinal Fluid) leakage: This is a rare problem, but may occur   with prolonged intrathecal or epidural catheters either due to the formation of a fistulous  track or a dural tear.       8. Nerve damage: By working so close to the spinal cord, there is always a possibility of  nerve damage, which could be as serious as a permanent spinal cord injury with  paralysis.       9. Death:  Although rare, severe deadly allergic reactions known as "Anaphylactic  reaction" can occur to any of the medications used.      10. Worsening of the symptoms:  We can always make thing worse.  What are the chances of something like this happening? Chances of any of this occuring are extremely low.  By statistics, you have more of a chance of getting killed in a motor vehicle accident: while driving to the hospital than any of the above occurring .  Nevertheless, you should be aware that they are possibilities.  In general, it is similar to taking a shower.  Everybody knows that you can slip, hit your head and get killed.  Does that mean that you should not shower again?  Nevertheless always keep in mind that statistics do not mean anything if you happen to be on the wrong side of them.  Even if a procedure has a 1 (one) in a 1,000,000 (million) chance of going wrong, it you happen to be that one..Also, keep in mind that by statistics, you have more of a chance of having something go wrong when taking medications.  Who should not have this procedure? If you are on a blood thinning medication (e.g. Coumadin, Plavix, see list of "Blood Thinners"), or if you have an active infection going on, you should not have the procedure.  If you are taking any blood thinners, please inform your physician.  How should I prepare for this procedure?  Do not eat or drink anything at least six hours prior to the procedure.  Bring a driver with you .  It cannot be a taxi.  Come accompanied by an adult that can drive you back, and that is strong enough to help you if your legs get weak or numb from the  local anesthetic.  Take all of your medicines the morning of the procedure with just enough water to swallow them.  If you have diabetes, make sure that you are scheduled to have your procedure done first thing in the morning, whenever possible.  If you have diabetes, take only half of your insulin dose and notify our nurse that you have done so as soon as you arrive at the clinic.  If you are diabetic, but only take blood sugar pills (oral hypoglycemic), then do not take them on the morning of your procedure.  You may take them after you have had the procedure.  Do not take aspirin or any aspirin-containing medications, at least eleven (11) days prior to the procedure.  They may prolong bleeding.  Wear loose fitting clothing that may be easy to take off  and that you would not mind if it got stained with Betadine or blood.  Do not wear any jewelry or perfume  Remove any nail coloring.  It will interfere with some of our monitoring equipment.  NOTE: Remember that this is not meant to be interpreted as a complete list of all possible complications.  Unforeseen problems may occur.  BLOOD THINNERS The following drugs contain aspirin or other products, which can cause increased bleeding during surgery and should not be taken for 2 weeks prior to and 1 week after surgery.  If you should need take something for relief of minor pain, you may take acetaminophen which is found in Tylenol,m Datril, Anacin-3 and Panadol. It is not blood thinner. The products listed below are.  Do not take any of the products listed below in addition to any listed on your instruction sheet.  A.P.C or A.P.C with Codeine Codeine Phosphate Capsules #3 Ibuprofen Ridaura  ABC compound Congesprin Imuran rimadil  Advil Cope Indocin Robaxisal  Alka-Seltzer Effervescent Pain Reliever and Antacid Coricidin or Coricidin-D  Indomethacin Rufen  Alka-Seltzer plus Cold Medicine Cosprin Ketoprofen S-A-C Tablets  Anacin Analgesic  Tablets or Capsules Coumadin Korlgesic Salflex  Anacin Extra Strength Analgesic tablets or capsules CP-2 Tablets Lanoril Salicylate  Anaprox Cuprimine Capsules Levenox Salocol  Anexsia-D Dalteparin Magan Salsalate  Anodynos Darvon compound Magnesium Salicylate Sine-off  Ansaid Dasin Capsules Magsal Sodium Salicylate  Anturane Depen Capsules Marnal Soma  APF Arthritis pain formula Dewitt's Pills Measurin Stanback  Argesic Dia-Gesic Meclofenamic Sulfinpyrazone  Arthritis Bayer Timed Release Aspirin Diclofenac Meclomen Sulindac  Arthritis pain formula Anacin Dicumarol Medipren Supac  Analgesic (Safety coated) Arthralgen Diffunasal Mefanamic Suprofen  Arthritis Strength Bufferin Dihydrocodeine Mepro Compound Suprol  Arthropan liquid Dopirydamole Methcarbomol with Aspirin Synalgos  ASA tablets/Enseals Disalcid Micrainin Tagament  Ascriptin Doan's Midol Talwin  Ascriptin A/D Dolene Mobidin Tanderil  Ascriptin Extra Strength Dolobid Moblgesic Ticlid  Ascriptin with Codeine Doloprin or Doloprin with Codeine Momentum Tolectin  Asperbuf Duoprin Mono-gesic Trendar  Aspergum Duradyne Motrin or Motrin IB Triminicin  Aspirin plain, buffered or enteric coated Durasal Myochrisine Trigesic  Aspirin Suppositories Easprin Nalfon Trillsate  Aspirin with Codeine Ecotrin Regular or Extra Strength Naprosyn Uracel  Atromid-S Efficin Naproxen Ursinus  Auranofin Capsules Elmiron Neocylate Vanquish  Axotal Emagrin Norgesic Verin  Azathioprine Empirin or Empirin with Codeine Normiflo Vitamin E  Azolid Emprazil Nuprin Voltaren  Bayer Aspirin plain, buffered or children's or timed BC Tablets or powders Encaprin Orgaran Warfarin Sodium  Buff-a-Comp Enoxaparin Orudis Zorpin  Buff-a-Comp with Codeine Equegesic Os-Cal-Gesic   Buffaprin Excedrin plain, buffered or Extra Strength Oxalid   Bufferin Arthritis Strength Feldene Oxphenbutazone   Bufferin plain or Extra Strength Feldene Capsules Oxycodone with Aspirin    Bufferin with Codeine Fenoprofen Fenoprofen Pabalate or Pabalate-SF   Buffets II Flogesic Panagesic   Buffinol plain or Extra Strength Florinal or Florinal with Codeine Panwarfarin   Buf-Tabs Flurbiprofen Penicillamine   Butalbital Compound Four-way cold tablets Penicillin   Butazolidin Fragmin Pepto-Bismol   Carbenicillin Geminisyn Percodan   Carna Arthritis Reliever Geopen Persantine   Carprofen Gold's salt Persistin   Chloramphenicol Goody's Phenylbutazone   Chloromycetin Haltrain Piroxlcam   Clmetidine heparin Plaquenil   Cllnoril Hyco-pap Ponstel   Clofibrate Hydroxy chloroquine Propoxyphen         Before stopping any of these medications, be sure to consult the physician who ordered them.  Some, such as Coumadin (Warfarin) are ordered to prevent or treat serious conditions such as "deep thrombosis", "pumonary embolisms", and  other heart problems.  The amount of time that you may need off of the medication may also vary with the medication and the reason for which you were taking it.  If you are taking any of these medications, please make sure you notify your pain physician before you undergo any procedures.          Trigger Point Injection Trigger points are areas where you have pain. A trigger point injection is a shot given in the trigger point to help relieve pain for a few days to a few months. Common places for trigger points include:  The neck.  The shoulders.  The upper back.  The lower back. A trigger point injection will not cure long-term (chronic) pain permanently. These injections do not always work for every person. For some people, they can help to relieve pain for a few days to a few months. Tell a health care provider about:  Any allergies you have.  All medicines you are taking, including vitamins, herbs, eye drops, creams, and over-the-counter medicines.  Any problems you or family members have had with anesthetic medicines.  Any blood disorders  you have.  Any surgeries you have had.  Any medical conditions you have. What are the risks? Generally, this is a safe procedure. However, problems may occur, including:  Infection.  Bleeding or bruising.  Allergic reaction to the injected medicine.  Irritation of the skin around the injection site. What happens before the procedure? Ask your health care provider about:  Changing or stopping your regular medicines. This is especially important if you are taking diabetes medicines or blood thinners.  Taking medicines such as aspirin and ibuprofen. These medicines can thin your blood. Do not take these medicines unless your health care provider tells you to take them.  Taking over-the-counter medicines, vitamins, herbs, and supplements. What happens during the procedure?   Your health care provider will feel for trigger points. A marker may be used to circle the area for the injection.  The skin over the trigger point will be washed with a germ-killing (antiseptic) solution.  A thin needle is used for the injection. You may feel pain or a twitching feeling when the needle enters the trigger point.  A numbing solution may be injected into the trigger point. Sometimes a medicine to keep down inflammation is also injected.  Your health care provider may move the needle around the area where the trigger point is located until the tightness and twitching goes away.  After the injection, your health care provider may put gentle pressure over the injection site.  The injection site will be covered with a bandage (dressing). The procedure may vary among health care providers and hospitals. What can I expect after treatment? After treatment, you may have:  Soreness and stiffness for 1-2 days.  A dressing. This can be taken off in a few hours or as told by your health care provider. Follow these instructions at home: Injection site care  Remove your dressing as told by your  health care provider.  Check your injection site every day for signs of infection. Check for: ? Redness, swelling, or pain. ? Fluid or blood. ? Warmth. ? Pus or a bad smell. Managing pain, stiffness, and swelling  If directed, put ice on the affected area. ? Put ice in a plastic bag. ? Place a towel between your skin and the bag. ? Leave the ice on for 20 minutes, 2-3 times a day. General instructions  If you were asked to stop your regular medicines, ask your health care provider when you may start taking them again.  Return to your normal activities as told by your health care provider. Ask your health care provider what activities are safe for you.  Do not take baths, swim, or use a hot tub until your health care provider approves.  You may be asked to see an occupational or physical therapist for exercises that reduce muscle strain and stretch the area of the trigger point.  Keep all follow-up visits as told by your health care provider. This is important. Contact a health care provider if:  Your pain comes back, and it is worse than before the injection. You may need more injections.  You have chills or a fever.  The injection site becomes more painful, red, swollen, or warm to the touch. Summary  A trigger point injection is a shot given in the trigger point to help relieve pain for a few days to a few months.  Common places for trigger point injections are the neck, shoulder, upper back, and lower back.  These injections do not always work for every person, but for some people, the injections can help to relieve pain for a few days to a few months.  Contact a health care provider if symptoms come back or they are worse than before treatment. Also, get help if the injection site becomes more painful, red, swollen, or warm to the touch. This information is not intended to replace advice given to you by your health care provider. Make sure you discuss any questions you  have with your health care provider. Document Revised: 10/17/2018 Document Reviewed: 10/17/2018 [employer] Patient Education  2020 [employer] Pain Management Discharge Instructions  General Discharge Instructions :  If you need to reach your doctor call: Monday-Friday 8:00 am - 4:00 pm at [phone] or toll free [phone].  After clinic hours [phone] to have operator reach doctor.  Bring all of your medication bottles to all your appointments in the pain clinic.  To cancel or reschedule your appointment with Pain Management please remember to call 24 hours in advance to avoid a fee.  Refer to the educational materials which you have been given on: General Risks, I had my Procedure. Discharge Instructions, Post Sedation.  Post Procedure Instructions:  The drugs you were given will stay in your system until tomorrow, so for the next 24 hours you should not drive, make any legal decisions or drink any alcoholic beverages.  You may eat anything you prefer, but it is better to start with liquids then soups and crackers, and gradually work up to solid foods.  Please notify your doctor immediately if you have any unusual bleeding, trouble breathing or pain that is not related to your normal pain.  Depending on the type of procedure that was done, some parts of your body may feel week and/or numb.  This usually clears up by tonight or the next day.  Walk with the use of an assistive device or accompanied by an adult for the 24 hours.  You may use ice on the affected area for the first 24 hours.  Put ice in a Ziploc bag and cover with a towel and place against area 15 minutes on 15 minutes off.  You may switch to heat after 24 hours.

## 2020-04-13 NOTE — Progress Notes (Signed)
Safety precautions to be maintained throughout the outpatient stay will include: orient to surroundings, keep bed in low position, maintain call bell within reach at all times, provide assistance with transfer out of bed and ambulation.  

## 2020-04-28 ENCOUNTER — Other Ambulatory Visit: Payer: Self-pay

## 2020-04-28 ENCOUNTER — Telehealth (INDEPENDENT_AMBULATORY_CARE_PROVIDER_SITE_OTHER): Payer: Medicare Other | Admitting: Psychiatry

## 2020-04-28 ENCOUNTER — Encounter: Payer: Self-pay | Admitting: Psychiatry

## 2020-04-28 DIAGNOSIS — F32A Depression, unspecified: Secondary | ICD-10-CM

## 2020-04-28 DIAGNOSIS — F329 Major depressive disorder, single episode, unspecified: Secondary | ICD-10-CM

## 2020-04-28 DIAGNOSIS — F1021 Alcohol dependence, in remission: Secondary | ICD-10-CM | POA: Insufficient documentation

## 2020-04-28 DIAGNOSIS — F410 Panic disorder [episodic paroxysmal anxiety] without agoraphobia: Secondary | ICD-10-CM | POA: Insufficient documentation

## 2020-04-28 DIAGNOSIS — F09 Unspecified mental disorder due to known physiological condition: Secondary | ICD-10-CM | POA: Insufficient documentation

## 2020-04-28 DIAGNOSIS — F5105 Insomnia due to other mental disorder: Secondary | ICD-10-CM | POA: Diagnosis not present

## 2020-04-28 DIAGNOSIS — Z8659 Personal history of other mental and behavioral disorders: Secondary | ICD-10-CM

## 2020-04-28 DIAGNOSIS — F0390 Unspecified dementia without behavioral disturbance: Secondary | ICD-10-CM | POA: Insufficient documentation

## 2020-04-28 MED ORDER — CITALOPRAM HYDROBROMIDE 20 MG PO TABS: 10.0000 mg | ORAL_TABLET | Freq: Every day | ORAL | 0 refills | Status: DC

## 2020-04-28 MED ORDER — VORTIOXETINE HBR 20 MG PO TABS: 10.0000 mg | ORAL_TABLET | ORAL | 1 refills | Status: DC

## 2020-04-28 NOTE — Patient Instructions (Signed)
Patient advised to stop Celexa or citalopram.  She is currently on 20 mg p.o. daily.  Advised patient to continue half and take 10 mg p.o. daily for the next 10 days and stop taking it.  She can start taking Trintellix 10 mg ,half tablet of the 20 mg which I prescribed daily for the next 2 weeks and then increase it into Trintellix 20 mg, 1 whole tablet after 2 weeks.  Patient to take the Trintellix in the morning with breakfast.  Patient with increased anxiety will need psychotherapy sessions.  We will refer her to therapist here at our practice.  Patient with cognitive issues will need neuropsychological testing as well as neurology referral.  She will be referred for the same.  Vortioxetine oral tablet What is this medicine? Vortioxetine (vor tee [employer] teen) is used to treat depression. This medicine may be used for other purposes; ask your health care provider or pharmacist if you have questions. COMMON BRAND NAME(S): BRINTELLIX, Trintellix What should I tell my health care provider before I take this medicine? They need to know if you have any of these conditions:  bipolar disorder or a family history of bipolar disorder  bleeding disorders  drink alcohol  glaucoma  liver disease  low levels of sodium in the blood  seizures  suicidal thoughts, plans, or attempt; a previous suicide attempt by you or a family member  take medicines that treat or prevent blood clots  an unusual or allergic reaction to vortioxetine, other medicines, foods, dyes, or preservatives  pregnant or trying to get pregnant  breast-feeding How should I use this medicine? Take this medicine by mouth with a glass of water. Follow the directions on the prescription label. You can take it with or without food. If it upsets your stomach, take it with food. Take your medicine at regular intervals. Do not take it more often than directed. Do not stop taking this medicine suddenly except upon the advice of  your doctor. Stopping this medicine too quickly may cause serious side effects or your condition may worsen. A special MedGuide will be given to you by the pharmacist with each prescription and refill. Be sure to read this information carefully each time. Talk to your pediatrician regarding the use of this medicine in children. Special care may be needed. Overdosage: If you think you have taken too much of this medicine contact a poison control center or emergency room at once. NOTE: This medicine is only for you. Do not share this medicine with others. What if I miss a dose? If you miss a dose, take it as soon as you can. If it is almost time for your next dose, take only that dose. Do not take double or extra doses. What may interact with this medicine? Do not take this medicine with any of the following medications:  linezolid  MAOIs like Carbex, Eldepryl, Marplan, Nardil, and Parnate  methylene blue (injected into a vein) This medicine may also interact with the following medications:  alcohol  aspirin and aspirin-like medicines  carbamazepine  certain medicines for depression, anxiety, or psychotic disturbances  certain medicines for migraine headache like almotriptan, eletriptan, frovatriptan, naratriptan, rizatriptan, sumatriptan, zolmitriptan  diuretics  fentanyl  furazolidone  isoniazid  medicines that treat or prevent blood clots like warfarin, enoxaparin, and dalteparin  NSAIDs, medicines for pain and inflammation, like ibuprofen or naproxen  phenytoin  procarbazine  quinidine  rasagiline  rifampin  supplements like St. John's wort, kava kava, valerian  tramadol  tryptophan This list may not describe all possible interactions. Give your health care provider a list of all the medicines, herbs, non-prescription drugs, or dietary supplements you use. Also tell them if you smoke, drink alcohol, or use illegal drugs. Some items may interact with your  medicine. What should I watch for while using this medicine? Tell your doctor if your symptoms do not get better or if they get worse. Visit your doctor or health care professional for regular checks on your progress. Because it may take several weeks to see the full effects of this medicine, it is important to continue your treatment as prescribed by your doctor. Patients and their families should watch out for new or worsening thoughts of suicide or depression. Also watch out for sudden changes in feelings such as feeling anxious, agitated, panicky, irritable, hostile, aggressive, impulsive, severely restless, overly excited and hyperactive, or not being able to sleep. If this happens, especially at the beginning of treatment or after a change in dose, call your health care professional. Bonita Quin may get drowsy or dizzy. Do not drive, use machinery, or do anything that needs mental alertness until you know how this medicine affects you. Do not stand or sit up quickly, especially if you are an older patient. This reduces the risk of dizzy or fainting spells. Alcohol may interfere with the effect of this medicine. Avoid alcoholic drinks. Your mouth may get dry. Chewing sugarless gum or sucking hard candy, and drinking plenty of water may help. Contact your doctor if the problem does not go away or is severe. What side effects may I notice from receiving this medicine? Side effects that you should report to your doctor or health care professional as soon as possible:  allergic reactions like skin rash, itching or hives, swelling of the face, lips, or tongue  anxious  black, tarry stools  changes in vision  confusion  elevated mood, decreased need for sleep, racing thoughts, impulsive behavior  eye pain  fast, irregular heartbeat  feeling faint or lightheaded, falls  feeling agitated, angry, or irritable  hallucination, loss of contact with reality  loss of balance or coordination  loss of  memory  painful or prolonged erections  restlessness, pacing, inability to keep still  seizures  stiff muscles  suicidal thoughts or other mood changes  trouble sleeping  unusual bleeding or bruising  unusually weak or tired  vomiting Side effects that usually do not require medical attention (report to your doctor or health care professional if they continue or are bothersome):  change in appetite or weight  change in sex drive or performance  constipation  dizziness  dry mouth  nausea This list may not describe all possible side effects. Call your doctor for medical advice about side effects. You may report side effects to FDA at 1-800-FDA-1088. Where should I keep my medicine? Keep out of the reach of children. Store at room temperature between 15 and 30 degrees C (59 and 86 degrees F). Throw away any unused medicine after the expiration date. NOTE: This sheet is a summary. It may not cover all possible information. If you have questions about this medicine, talk to your doctor, pharmacist, or health care provider.  2020 [employer] (2016-02-04 16:45:13)
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
## 2020-05-05 ENCOUNTER — Ambulatory Visit: Payer: Medicare Other | Admitting: Pain Medicine

## 2020-05-05 ENCOUNTER — Encounter: Payer: Self-pay | Admitting: Counselor

## 2020-05-18 ENCOUNTER — Ambulatory Visit (INDEPENDENT_AMBULATORY_CARE_PROVIDER_SITE_OTHER): Payer: Medicare Other | Admitting: Licensed Clinical Social Worker

## 2020-05-18 ENCOUNTER — Ambulatory Visit: Payer: Medicare Other | Admitting: Licensed Clinical Social Worker

## 2020-05-18 ENCOUNTER — Encounter: Payer: Self-pay | Admitting: Licensed Clinical Social Worker

## 2020-05-18 DIAGNOSIS — F410 Panic disorder [episodic paroxysmal anxiety] without agoraphobia: Secondary | ICD-10-CM

## 2020-05-18 DIAGNOSIS — F1021 Alcohol dependence, in remission: Secondary | ICD-10-CM | POA: Diagnosis not present

## 2020-05-18 DIAGNOSIS — F32A Depression, unspecified: Secondary | ICD-10-CM

## 2020-05-18 DIAGNOSIS — F09 Unspecified mental disorder due to known physiological condition: Secondary | ICD-10-CM | POA: Diagnosis not present

## 2020-05-18 DIAGNOSIS — F329 Major depressive disorder, single episode, unspecified: Secondary | ICD-10-CM
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
## 2020-06-01 ENCOUNTER — Ambulatory Visit: Payer: Medicare Other | Admitting: Licensed Clinical Social Worker

## 2020-06-09 ENCOUNTER — Telehealth: Payer: Medicare Other | Admitting: Psychiatry

## 2020-06-17 ENCOUNTER — Other Ambulatory Visit: Payer: Self-pay

## 2020-06-17 ENCOUNTER — Encounter: Payer: Self-pay | Admitting: Counselor

## 2020-06-17 ENCOUNTER — Ambulatory Visit: Payer: Medicare Other | Admitting: Psychology

## 2020-06-17 ENCOUNTER — Ambulatory Visit (INDEPENDENT_AMBULATORY_CARE_PROVIDER_SITE_OTHER): Payer: Medicare Other | Admitting: Counselor

## 2020-06-17 DIAGNOSIS — R419 Unspecified symptoms and signs involving cognitive functions and awareness: Secondary | ICD-10-CM | POA: Diagnosis not present

## 2020-06-17 DIAGNOSIS — R4189 Other symptoms and signs involving cognitive functions and awareness: Secondary | ICD-10-CM

## 2020-06-17 DIAGNOSIS — F99 Mental disorder, not otherwise specified: Secondary | ICD-10-CM

## 2020-06-17 NOTE — Progress Notes (Signed)
   Psychometrist Note   Cognitive testing was administered to Jodi Parrish by Milana Kidney, B.S. (Technician) under the supervision of Alphonzo Severance, Psy.D., ABN. Ms. Jodi Parrish was able to tolerate all test procedures. Dr. Nicole Kindred met with the patient as needed to manage any emotional reactions to the testing procedures. Rest breaks were offered.    The battery of tests administered was selected by Dr. Nicole Kindred with consideration to the patient's current level of functioning, the nature of her symptoms, emotional and behavioral responses during the interview, level of literacy, observed level of motivation/effort, and the nature of the referral question. This battery was communicated to the psychometrist. Communication between Dr. Nicole Kindred and the psychometrist was ongoing throughout the evaluation and Dr. Nicole Kindred was immediately accessible at all times. Dr. Nicole Kindred provided supervision to the technician on the date of this service, to the extent necessary to assure the quality of all services provided.    Ms. Jodi Parrish will return in approximately one week for an interactive feedback session with Dr. Nicole Kindred, at which time test performance, clinical impressions, and treatment recommendations will be reviewed in detail. The patient understands she can contact our office should she require our assistance before this time.   A total of 130 minutes of billable time were spent with Jodi Parrish by the technician, including test administration and scoring time. Billing for these services is reflected in Dr. Les Pou note.   This note reflects time spent with the psychometrician and does not include test scores, clinical history, or any interpretations made by Dr. Nicole Kindred. The full report will follow in a separate note.
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
## 2020-06-19 NOTE — Progress Notes (Signed)
NEUROPSYCHOLOGICAL TEST SCORES [facility]  Patient Name: Jodi Parrish MRN: [id number] Date of Birth: Mar 03, 1946 Age: 74 y.o. Education: 16 years  Measurement properties of test scores: IQ, Index, and Standard Scores (SS): Mean = 100; Standard Deviation = 15 Scaled Scores (Ss): Mean = 10; Standard Deviation = 3 Z scores (Z): Mean = 0; Standard Deviation = 1 T scores (T); Mean = 50; Standard Deviation = 10  TEST SCORES:    Note: This summary of test scores accompanies the interpretive report and should not be interpreted by unqualified individuals or in isolation without reference to the report. Test scores are relative to age, gender, and educational history as available and appropriate.   Performance Validity        ACS: Raw Descriptor      Word Choice: 41 Below Expectation          MSVT: Raw Descriptor      IR 85 Below Expectation      DR 85 Below Expectation      CNS 80 Below Expectation      PA 0 ---      FR 0 ---      The Dot Counting Test: Raw Descriptor      E-Score 16 Within Expectation      Embedded Measures: Raw Descriptor      RBANS Effort Index: 3 Within Expectation      WAIS-IV Reliable Digit Span 11 Within Expectation      WAIS-IV Reliable Digit Span Revised 14 Within Expectation      Expected Functioning        Wide Range Achievement Test (Word Reading): Standard/Scaled Score Percentile       Word Reading 107 68      Cognitive Testing        RBANS, Form : Standard/Scaled Score Percentile  Total Score 53 <1  Immediate Memory 49 <1      List Learning 1 <1      Story Memory 3 1  Visuospatial/Constructional 78 7      Figure Copy   (18) 10 50      Judgment of Line Orientation   (7) --- <2  Language 57 <1      Picture Naming --- <2      Semantic Fluency 4 2  Attention 79 8      Digit Span 10 50      Coding 3 1  Delayed Memory 44 <1      List Recall   (0) --- <2      List Recognition   (13) --- <2      Story Recall   (0) 1 <1       Figure Recall   (3) 3 1      Wechsler Adult Intelligence Scale - IV: Standard/Scaled Score Percentile  Working Memory Index 86 18      Digit Span 9 37          Digit Span Forward 13 84          Digit Span Backward 10 50          Digit Span Sequencing 5 5      Arithmetic 6 9  Processing Speed Index 62 1      Symbol Search 2 <1      Coding 4 2      Neuropsychological Assessment Battery (Language Module): T-score Percentile      Naming   (12) 19 <1  Verbal Fluency: T-score Percentile      Controlled Oral Word Association (F-A-S) 47 38      Semantic Fluency (Animals) 7 <1      Trail Making Test: T-Score Percentile      Part A 34 5      Part B 14 <1      Modified Wisconsin Card Sorting Test (MWCST): Standard/T-Score Percentile      Number of Categories Correct 19 <1      Number of Perseverative Errors 34 5      Number of Total Errors 25 1      Percent Perseverative Errors 46 34  Executive Function Composite 61 <1      Boston Diagnostic Aphasia Exam: Raw Score Scaled Score      Complex Ideational Material 9 5      Rating Scales         Raw Score Descriptor  Patient Health Questionnaire - 9 7 Mild  GAD-7 15 Severe      Quick Dementia Rating System Raw Score Descriptor      Sum of Boxes 4.5 Mild Dementia      Total Score 10.5 Mild Dementia   Kayani Rapaport V. Roseanne Reno PsyD, ABN Clinical Neuropsychologist

## 2020-06-23 ENCOUNTER — Other Ambulatory Visit: Payer: Self-pay

## 2020-06-23 ENCOUNTER — Emergency Department
Admission: EM | Admit: 2020-06-23 | Discharge: 2020-06-23 | Disposition: A | Discharge status: Home / Self Care | Payer: Medicare Other | Attending: Emergency Medicine | Admitting: Emergency Medicine

## 2020-06-23 ENCOUNTER — Emergency Department: Payer: Medicare Other

## 2020-06-23 DIAGNOSIS — F0390 Unspecified dementia without behavioral disturbance: Secondary | ICD-10-CM | POA: Diagnosis not present

## 2020-06-23 DIAGNOSIS — Z96621 Presence of right artificial elbow joint: Secondary | ICD-10-CM | POA: Diagnosis not present

## 2020-06-23 DIAGNOSIS — Z79899 Other long term (current) drug therapy: Secondary | ICD-10-CM | POA: Insufficient documentation

## 2020-06-23 DIAGNOSIS — R531 Weakness: Secondary | ICD-10-CM | POA: Insufficient documentation

## 2020-06-23 DIAGNOSIS — Z96652 Presence of left artificial knee joint: Secondary | ICD-10-CM | POA: Insufficient documentation

## 2020-06-23 DIAGNOSIS — R413 Other amnesia: Secondary | ICD-10-CM | POA: Diagnosis not present

## 2020-06-23 DIAGNOSIS — R42 Dizziness and giddiness: Secondary | ICD-10-CM | POA: Diagnosis present

## 2020-06-23 DIAGNOSIS — I10 Essential (primary) hypertension: Secondary | ICD-10-CM | POA: Insufficient documentation

## 2020-06-23 DIAGNOSIS — Z87891 Personal history of nicotine dependence: Secondary | ICD-10-CM | POA: Insufficient documentation

## 2020-06-23 DIAGNOSIS — Z96643 Presence of artificial hip joint, bilateral: Secondary | ICD-10-CM | POA: Insufficient documentation

## 2020-06-23 LAB — URINALYSIS, COMPLETE (UACMP) WITH MICROSCOPIC
Bacteria, UA: NONE SEEN
Bilirubin Urine: NEGATIVE
Glucose, UA: NEGATIVE mg/dL
Hgb urine dipstick: NEGATIVE
Ketones, ur: NEGATIVE mg/dL
Leukocytes,Ua: NEGATIVE
Nitrite: NEGATIVE
Protein, ur: NEGATIVE mg/dL
Specific Gravity, Urine: 1.01 (ref 1.005–1.030)
pH: 9 — ABNORMAL HIGH (ref 5.0–8.0)

## 2020-06-23 LAB — CBC
HCT: 39.4 % (ref 36.0–46.0)
Hemoglobin: 12.7 g/dL (ref 12.0–15.0)
MCH: 30.7 pg (ref 26.0–34.0)
MCHC: 32.2 g/dL (ref 30.0–36.0)
MCV: 95.2 fL (ref 80.0–100.0)
Platelets: 206 10*3/uL (ref 150–400)
RBC: 4.14 MIL/uL (ref 3.87–5.11)
RDW: 13.3 % (ref 11.5–15.5)
WBC: 6.9 10*3/uL (ref 4.0–10.5)
nRBC: 0 % (ref 0.0–0.2)

## 2020-06-23 LAB — BASIC METABOLIC PANEL
Anion gap: 10 (ref 5–15)
BUN: 10 mg/dL (ref 8–23)
CO2: 28 mmol/L (ref 22–32)
Calcium: 9.9 mg/dL (ref 8.9–10.3)
Chloride: 101 mmol/L (ref 98–111)
Creatinine, Ser: 0.92 mg/dL (ref 0.44–1.00)
GFR calc non Af Amer: >60 mL/min (ref >60)
Glucose, Bld: 102 mg/dL — ABNORMAL HIGH (ref 70–99)
Potassium: 3.9 mmol/L (ref 3.5–5.1)
Sodium: 139 mmol/L (ref 135–145)

## 2020-06-23 MED ORDER — ACETAMINOPHEN 500 MG PO TABS
1000.0000 mg | ORAL_TABLET | Freq: Once | ORAL | Status: AC
  Administered 2020-06-23: 1000 mg via ORAL
  Filled 2020-06-23: qty 2

## 2020-06-23 NOTE — ED Notes (Signed)
See triage note, pt reports "collapsing" last night. Denies hitting head or LOC.  Reports weakness last night. To treatment room in w/c, standby assist transfer to transfer.  Denies any further episodes of "collapsing" since last night Speech clear. Equal grip and strength.

## 2020-06-23 NOTE — Progress Notes (Signed)
NEUROPSYCHOLOGICAL EVALUATION [facility]  Patient Name: Jodi Parrish MRN: [id number] Date of Birth: [birth year] Age: 74 y.o. Education: 16 years  Clinical Impressions  Jodi Parrish is a 74 y.o., right-hand dominant, married woman with a complex history of psychiatric difficulties and alcohol use disorder since at least the 30s. She and her husband had a hard time providing a detailed history of her psychiatric and cognitive symptoms and the history of cognitive problems is colored by her alcohol use, psychiatric disturbance, and lack of functioning outside the home. It sounds as though she has had increasing difficulties with cognition and with memory, she has been repeating herself and asking the same questions, since at least 2015. She is quite debilitated by her symptoms at present and she spends much of her day going from thing to thing unable to focus long enough to get anything done. This was noted in her test taking behavior, as she seemed to lose mental set repeatedly, requiring constant reorientation to test task instructions. She has a CT of the head that is difficult to read due to the slab angle but it does show at least mild volume loss with question of more in the frontal and temporal areas.   On exam, Jodi Parrish's test taking behavior and marked difficulties maintaining mental set render the findings a very conservative estimate of her actual abilities and call for cautious interpretation. Nevertheless, there were areas of preserved performance on some tasks including on measures of visuospatial construction, aggregate attentional performance, and also on phonemic verbal fluency. This suggests that she was able to apply herself to some extent. These preserved scores contrast markedly with extremely low scores in other areas, including on most measures of processing speed and executive function. Most notably, she demonstrated consistently poor performance with an amnestic  flavor on memory measures in conjunction with very poor semantic as compared to phonemic verbal fluency and markedly deficient visual object confrontation naming. These findings are not pathognomonic but are highly suggestive of cognitive changes often observed in the setting of Alzheimer's disease.   Jodi Parrish is thus demonstrating a complex presentation. While medication side effects, poor attention regulation, psychiatric disturbance and other factors could easily account for many of her low test scores, she is demonstrating a consistent pattern of impaired performance on measures that are known to be diagnostic of Alzheimer's clinical syndrome, including tests such as naming that are minimally affected by attentional demands. I do believe that she has a major neurocognitive disorder level of dysfunction whether it is due to that etiology or something else. 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 I concur with Dr. Elna Breslow that she would benefit from neurology consultation for full workup. She would likely benefit from an MRI of the brain and, if diagnostic uncertainty remains after that, CSF biomarkers for Alzheimer's disease may also be helpful.   Diagnostic Impressions: Major neurocognitive disorder Bipolar II disorder (by history) Medication side effects  Recommendations to be discussed with patient  Your performance and presentation on assessment today were consistent with numerous low scores in different areas. You clearly have a very difficult time focusing on things, applying yourself, and likely reduced your scores rendering them conservative estimates of your actual abilities. Nevertheless, you did have significant areas of low performance on measures that are known to  be diagnostic of Alzheimer's clinical syndrome. I would like to highlight that the  history you provided, which is always the most important part of the workup, is challenging to interpret and that further investigation is needed before coming to a definitive diagnosis, and therefore the best diagnosis at this point in time is Major neurocognitive disorder, due to an unspecified etiology.   Major neurocognitive disorder is the term for changes in memory and thinking that are sufficiently severe as to compromise your capacity for independence. This term is typically used to denote permanent causes of cognitive impairment such as head injury, stroke, or dementia due to a neurodegenerative process. In your case, there is some question as to whether your difficulties are due to an underlying condition (most likely Alzheimer's disease) or simple medication side effects, psychiatric problems, and other factors. This is an important difference because if your difficulties are due to an underlying condition, then they will worsen over time. Therefore I suggest that you obtain further workup.   Dr. Elna Breslow has referred you for neurological evaluation, which I think is the next logical step. This is a doctor who specializes in problems of the brain and can further investigate your issues. Specifically, it would be helpful to have higher resolution neuroimaging, such as an MRI of the brain and to check for any movement abnormalities. If diagnostic certainty still remains after that and there continues to be concern about Alzheimer's disease, it would be reasonable to pursue other studies such as a lumbar puncture for CSF biomarkers, which can identify proteins that are diagnostic of Alzheimer's disease and would confirm or refute that diagnosis. If all these studies are negative, then it is possible that psychiatric problems, aging in the setting of major mental illness, and medication side effects are the main contributors and that your issues could improve if these things were addressed.   I am  concerned that your psychiatric medication regimen is likely contributing significantly to your cognitive problems. If you do have an underlying dementia, you should know that many psychiatric medications can contribute to changes in memory and thinking, because they are not tolerated as well as in nondemented individuals. Only you and your prescribing provider can weigh the costs and benefits of your various medications but it is important to understand the decision at hand so that you can make the most informed decision.   At this point, I recommend that your husband or other trusted representatives provide you with assistance as needed with complex and memory dependent activities, including things like making decisions of high risk, managing money, and the like.   You should not drive due to notable difficulties with attention regulation.   Test Findings  Test scores are summarized in additional documentation associated with this encounter. Test scores are relative to age, gender, and educational history as available and appropriate. There were concerns about performance validity given a couple of indicators below expectations and Jodi Parrish's test taking behavior, which was characterized by extreme difficulties maintaining mental set. Nevertheless, she did seem able to apply herself on some tasks, suggesting that the findings are not entirely invalid. Data are thus interpreted cautiously.   General Intellectual Functioning/Achievement:  Performance on single word reading was toward the upper end of the average range, which presents as a reasonable standard of comparison for Jodi Parrish given her level of education achievement.   Attention and Processing Efficiency: Performance on indicators of attention and working memory was variable, although she did achieve  a reasonable low average score on the overall index, which is likely the most reliable measure. Digit repetition forward was very good,  ranging from average to high average. Digit repetition backward was average. Digit resequencing in ascending order was unusually low. Mental solving of arithmetical word problems was extremely low.  With respect to processing speed, performance was extremely low across the board, with comparable extremely low scores on two different measures of timed number symbol coding and one measure of efficient visual matching and efficient visual scanning.   Language: Language findings revealed markedly deficient visual object confrontation naming, which is hard to attribute simply to executive control difficulties given the minimal attentional demands of that task. Generation of words in response to given letters was average whereas generation of animals in one minute was extremely low.   Visuospatial Function: Performance on measures of visuospatial and constructional functioning represented a relative strength with respect to construction, which was average, although she did have a hard time understanding the instructions for judgment of angular line orientations. Overall performance was extremely low.   Learning and Memory: Performance on measures of memory and learning showed very low scores, with extremely low immediate and delayed recall of various visual and verbal material including a 10-item word list repeated four times, a short story, and recall of a modestly complex figure stimulus. Recognition cuing did not increase her performance, which was near chance levels.   Executive Functions: Performance was low on most executive measures, with the exception of generation of words in response to the letters F-A-S, which was average. Modified [employer] was extremely low on the executive function composite with an extremely low categories completed score and an unusually low perseverative errors score. Reasoning with verbal information was unusually low on the [employer].  Alternating sequencing of numbers and letters of the alphabet was discontinued at the time limit, generating an extremely low score.   Rating Scale(s): Ms. Tommi Rumps Parrish interestingly reported only mild levels of depressive symptoms. She reported severe levels of anxiety symptoms. 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   Bettye Boeck Roseanne Reno PsyD, ABN Clinical Neuropsychologist

## 2020-06-23 NOTE — ED Notes (Signed)
Signature pad not working, pt and family verbalize understanding of d/c instructions. Denies questions or concerns

## 2020-06-23 NOTE — ED Notes (Signed)
Patient transported to CT 

## 2020-06-23 NOTE — ED Provider Notes (Signed)
[facility] Emergency Department Provider Note ____________________________________________   First MD Initiated Contact with Patient 06/23/20 1211     (approximate)  I have reviewed the triage vital signs and the nursing notes.  HISTORY  Chief Complaint Dizziness and Weakness   HPI Jodi Parrish is a 74 y.o. femalewho presents to the ED for evaluation of generalized weakness, falls and forgetfulness.   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  Constellation of symptoms concerning for Alzheimer's syndrome, per neurology.  Outpatient MRI was recommended.  Patient presents to the ED with her husband for acute on chronic generalized weakness with 2 associated falls in the past 48 hours.  Patient reports a fall last night, and an additional fall the night prior, when she got up in the middle of the night to urinate.  She reports bilateral leg weakness while walking from the toilet back to her bed, causing her to collapse onto the floor onto a carpeted surface.  Denies syncope or head trauma.  She was ambulatory since this.  She reports an additional fall, of a similar nature, last night where she collapsed in the doorway leading from the bathroom back to her bedroom.  She reports being ambulatory this morning at home, "but moving slowly because I was worried I will fall again."  Patient's husband reports increasing forgetfulness, short-term memory loss, generalized weakness over the past few months.  Patient reports feeling well here in the ED and denies complaints.  Past Medical History:  Diagnosis Date  . Ankle fracture    right  . Anxiety   . Arthritis   . Bipolar disorder (HCC)   . Depression   . Diverticulitis   . Hyperlipidemia   . Hypertension   . Osteoarthritis   . Overactive bladder     Patient  Active Problem List   Diagnosis Date Noted  . Panic attacks 04/28/2020  . Cognitive disorder 04/28/2020  . History of bipolar disorder 04/28/2020  . Alcohol use disorder, severe, in sustained remission (HCC) 04/28/2020  . Osteoarthritis involving multiple joints 04/13/2020  . 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 Elevated C-reactive protein (CRP) 04/13/2020  . Cervical spondylosis without myelopathy 04/13/2020  . DDD (degenerative disc disease), cervical 04/13/2020  . 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, C5-6) (Left) 04/13/2020  . Cervical kyphosis 04/13/2020  . Osteoarthritis of glenohumeral joints (Bilateral) 04/13/2020  . Osteoarthritis of AC (acromioclavicular) joint (Right) 04/13/2020  . Thoracic scoliosis (Right) 04/13/2020  . Thoracic spondylosis without myelopathy 04/13/2020  . DDD (degenerative disc disease), thoracic 04/13/2020  . DDD (degenerative disc disease), lumbosacral 04/13/2020  . Lumbar levoscoliosis 04/13/2020  . Lumbar Grade 1 Retrolisthesis of L1/L2 04/13/2020  . Severe Narrowing of lumbar intervertebral disc space from L1-4 (Right) 04/13/2020  . Renal cyst 04/13/2020  . Abnormal MRI, lumbar spine (05/18/2018) 04/13/2020  . Lumbosacral facet hypertrophy (Multilevel) (Bilateral) 04/13/2020  . Lumbosacral facet syndrome (Multilevel) (Bilateral) (L>R) 04/13/2020  . Chronic generalized pain 01/29/2020  . Depression 01/29/2020  . Chronic low back pain (1ry area of Pain) (Bilateral) (R>L) w/o sciatica 01/29/2020  . Chronic feet pain (2ry area of Pain) (Bilateral) (R>L) 01/29/2020  . Chronic shoulder pain (3ry area of Pain) (Bilateral) (L>R) 01/29/2020  . Chronic upper extremity pain (4th area of Pain) (Bilateral) (L>R) 01/29/2020  . Chronic mid back pain (5th area of Pain) (Bilateral) (L>R) 01/29/2020  .  Cervicalgia 01/29/2020  . Chronic neck pain (6th area of Pain) (Bilateral) (R>L) 01/29/2020  . Chronic knee pain (8th area of Pain) (Bilateral) (L>R)  01/29/2020  . 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 Chronic hip pain (Bilateral) (L>R) 01/29/2020  . Aphasia 01/29/2020  . Chronic pain syndrome 01/27/2020  . Pharmacologic therapy 01/27/2020  . Disorder of skeletal system 01/27/2020  . Problems influencing health status 01/27/2020  . Nonexertional chest pain 10/08/2019  . Osteoarthritis of shoulder (SEVERE) (Left) 06/10/2019  . Anxiety 04/04/2019  . Benign esophageal stricture 07/19/2018  . Esophageal dysphagia 07/19/2018  . Use of cane as ambulatory aid 07/13/2018  . Healthcare maintenance 02/14/2018  . Hypercholesterolemia 02/16/2017  . Osteoarthritis of hip (Right) 01/10/2017  . Bipolar 1 disorder (HCC) 10/31/2016  . Essential hypertension 10/31/2016  . Generalized osteoarthritis of multiple sites 10/31/2016    Past Surgical History:  Procedure Laterality Date  . ABDOMINAL HYSTERECTOMY  1998  . BARTHOLIN GLAND CYST EXCISION  1976  . COLONOSCOPY    . COLONOSCOPY WITH PROPOFOL N/A 04/10/2018   Procedure: COLONOSCOPY WITH PROPOFOL;  Surgeon: Toledo, Boykin Nearing, MD;  Location: [facility] ENDOSCOPY;  Service: Gastroenterology;  Laterality: N/A;  . ELBOW SURGERY Right 1995   crushed elbow with hardware implanted  . ESOPHAGOGASTRODUODENOSCOPY (EGD) WITH PROPOFOL N/A 04/10/2018   Procedure: ESOPHAGOGASTRODUODENOSCOPY (EGD) WITH PROPOFOL;  Surgeon: Toledo, Boykin Nearing, MD;  Location: [facility] ENDOSCOPY;  Service: Gastroenterology;  Laterality: N/A;  . JOINT REPLACEMENT Left 2011   knee  . JOINT REPLACEMENT Right 2009   elbow  . KNEE SURGERY Left 2000  . PELVIC FLOOR REPAIR  2009   PELVIC FLOOR LIFT  . REPLACEMENT TOTAL KNEE Left 2016  . TONSILLECTOMY  1958  . TOTAL HIP ARTHROPLASTY Left 2008  . TOTAL HIP ARTHROPLASTY Right 01/10/2017   Procedure: TOTAL HIP ARTHROPLASTY ANTERIOR APPROACH;  Surgeon: Kennedy Bucker, MD;  Location: [facility] ORS;   Service: Orthopedics;  Laterality: Right;    Prior to Admission medications   Medication Sig Start Date End Date Taking? Authorizing Provider  acetaminophen (TYLENOL) 650 MG CR tablet Take 650 mg by mouth every 8 (eight) hours as needed for pain.    [provider]  amoxicillin (AMOXIL) 500 MG capsule Take 500 mg by mouth daily. PRIOR TO DENTAL APPOINTMENT    [provider]  bisacodyl (DULCOLAX) 10 MG suppository Place 1 suppository (10 mg total) rectally as needed for moderate constipation. 03/17/20   Jene Every, MD  Cholecalciferol (VITAMIN D-3) 5000 units TABS Take 1 tablet by mouth daily.    [provider]  citalopram (CELEXA) 20 MG tablet Take 0.5 tablets (10 mg total) by mouth daily. 04/28/20   Jomarie Longs, MD  dicyclomine (BENTYL) 10 MG capsule Take 1 capsule (10 mg total) by mouth 4 (four) times daily for 14 days. 03/17/20 03/31/20  Jene Every, MD  folic acid (FOLVITE) 800 MCG tablet Take 800 mcg by mouth daily.    [provider]  gabapentin (NEURONTIN) 300 MG capsule Take 300 mg by mouth 3 (three) times daily. 12/03/19   [provider]  lamoTRIgine (LAMICTAL) 200 MG tablet Take 200 mg by mouth at bedtime.    [provider]  magnesium 30 MG tablet Take 250 mg by mouth daily.    [provider]  Multiple Vitamins-Minerals (MULTIVITAMIN WITH MINERALS) tablet Take 1 tablet by mouth daily.    [provider]  neomycin-polymyxin-dexameth (MAXITROL) 0.1 % OINT 1 application. Patient not taking: Reported on 04/28/2020    [provider]  polyethylene glycol (MIRALAX) 17 g packet Take 17 g by mouth daily. 03/17/20   Jene Every, MD  pravastatin (PRAVACHOL) 40 MG tablet Take 40 mg by mouth at bedtime.    [provider]  propranolol ER (INDERAL LA) 60 MG 24 hr capsule Take 60 mg by mouth daily.    [provider]  spironolactone (ALDACTONE) 100 MG tablet Take 100 mg by mouth at bedtime.     [provider]  traZODone (DESYREL) 100 MG tablet Take 200 mg by mouth at bedtime.    [provider]  vitamin B-12 (CYANOCOBALAMIN) 1000 MCG tablet Take 1,000 mcg by mouth daily. Patient not taking: Reported on 04/28/2020    [provider]  vortioxetine HBr (TRINTELLIX) 20 MG TABS tablet Take 0.5-1 tablets (10-20 mg total) by mouth as directed. Start taking half tablet ( 10 mg ) daily for 2 weeks and increase to 1 tablet ( 20 mg ) daily after that 04/28/20   Jomarie Longs, MD    Allergies Budesonide-formoterol fumarate, Abilify [aripiprazole], and Duloxetine  Family History  Problem Relation Age of Onset  . Parkinson's disease Mother   . CAD Father   . Hypertension Father   . Anxiety disorder Daughter   . Depression Daughter   . Suicidality Daughter   . Alcohol abuse Daughter   . Anxiety disorder Daughter   . Anxiety disorder Son   . Breast cancer Neg Hx     Social History Social History   Tobacco Use  . Smoking status: Former Smoker    Types: Cigarettes  . Smokeless tobacco: Never Used  Vaping Use  . Vaping Use: Never used  Substance Use Topics  . Alcohol use: Not Currently    Comment: HEAVY USE IN THE PAST- QUIT 3 YEARS AGO  . Drug use: No    Review of Systems  Constitutional: No fever/chills.  Positive for generalized weakness and falls Eyes: No visual changes. ENT: No sore throat. Cardiovascular: Denies chest pain. Respiratory: Denies shortness of breath. Gastrointestinal: No abdominal pain.  No nausea, no vomiting.  No diarrhea.  No constipation. Genitourinary: Negative for dysuria. Musculoskeletal: Negative for back pain. Skin: Negative for rash. Neurological: Negative for headaches, focal weakness or numbness.Marland Kitchen  Positive for forgetfulness  ____________________________________________   PHYSICAL EXAM:  VITAL SIGNS: Vitals:   06/23/20 1024 06/23/20 1409  BP: (!) 152/84 (!) 152/84  Pulse: 68 63  Resp: 17 18  Temp:   98.7 F (37.1 C)  SpO2: 98% 99%     Constitutional: Alert and oriented to person, place and time. Well appearing and in no acute distress. Eyes: Conjunctivae are normal. PERRL. EOMI. Head: Atraumatic. Nose: No congestion/rhinnorhea. Mouth/Throat: Mucous membranes are moist.  Oropharynx non-erythematous. Neck: No stridor. No cervical spine tenderness to palpation. Cardiovascular: Normal rate, regular rhythm. Grossly normal heart sounds.  Good peripheral circulation. Respiratory: Normal respiratory effort.  No retractions. Lungs CTAB. Gastrointestinal: Soft , nondistended, nontender to palpation. No abdominal bruits. No CVA tenderness. Musculoskeletal: No lower extremity tenderness nor edema.  No joint effusions. No signs of acute trauma. Neurologic:  Normal speech and language. No gross focal neurologic deficits are appreciated. No gait instability noted. Cranial nerves II through XII intact 5/5 strength and sensation in all 4 extremities Patient stands independently and ambulates around the room with me without assistance with a slow gait with short steps, but no instability  noted and she reports feeling well without symptoms. Skin:  Skin is warm, dry and intact. No rash noted. Psychiatric: Mood and affect are normal. Speech and behavior are normal.  ____________________________________________   LABS (all labs ordered are listed, but only abnormal results are displayed)  Labs Reviewed  BASIC METABOLIC PANEL - Abnormal; Notable for the following components:      Result Value   Glucose, Bld 102 (*)    All other components within normal limits  URINALYSIS, COMPLETE (UACMP) WITH MICROSCOPIC - Abnormal; Notable for the following components:   Color, Urine YELLOW (*)    APPearance CLOUDY (*)    pH 9.0 (*)    All other components within normal limits  CBC   ____________________________________________  12 Lead EKG Sinus rhythm, rate of 69 bpm.  Normal axis and intervals.  No  evidence of acute ischemia.  ____________________________________________  RADIOLOGY  ED MD interpretation: CT head reviewed without evidence of acute intracranial pathology.  Official radiology report(s): CT Head Wo Contrast  Result Date: 06/23/2020 CLINICAL DATA:  Dizziness, confusion EXAM: CT HEAD WITHOUT CONTRAST TECHNIQUE: Contiguous axial images were obtained from the base of the skull through the vertex without intravenous contrast. COMPARISON:  12/18/2019 FINDINGS: Brain: No evidence of acute infarction, hemorrhage, hydrocephalus, extra-axial collection or mass lesion/mass effect. Mild cerebral volume loss, normal for age. Vascular: Atherosclerotic calcifications involving the large vessels of the skull base. No unexpected hyperdense vessel. Skull: Normal. Negative for fracture or focal lesion. Sinuses/Orbits: No acute finding. Other: None. IMPRESSION: No acute intracranial findings. Electronically Signed   By: Duanne Guess D.O.   On: 06/23/2020 13:01    ____________________________________________   PROCEDURES and INTERVENTIONS  Procedure(s) performed (including Critical Care):  Procedures  Medications  acetaminophen (TYLENOL) tablet 1,000 mg (1,000 mg Oral Given 06/23/20 1238)    ____________________________________________   MDM / ED COURSE  74 year old woman who is known worsening neurocognitive deficits concerning for dementia, possibly Alzheimer's etiology, presents with progressive worsening of her generalized weakness and forgetfulness, without evidence of acute pathology most consistent with her dementia, amenable to outpatient management.  Normal vital signs on room air.  Exam is reassuring without evidence of acute distress, traumatic pathology or neurovascular deficits.  She is well-appearing and conversational.  While she does answer her orientation questions appropriately for me, in conversation with her and her husband, she frequently mixes up her days and  forgets which days that she fell within the past week.  Her blood work shows no acute derangements or evidence of systemic illness.  CT head without ICH, fracture or other intracranial pathology.  Her EKG is nonischemic without concerning interval changes to suggest syncope.  She is asymptomatic here in the ED and well connected with her PCP and neurologist already.  While she was recommended to get an outpatient MRI, I see no indications for emergent MRI today through the ED.  I urged her and her husband to follow-up with her neurologist as scheduled, and to see her PCP again within the next week to discuss her continued symptoms.  We discussed return precautions for the ED.  Patient medically stable for discharge home.      ____________________________________________   FINAL CLINICAL IMPRESSION(S) / ED DIAGNOSES  Final diagnoses:  Dementia without behavioral disturbance, unspecified dementia type (HCC)  Dizziness  Weakness     ED Discharge Orders    None       Jodi Parrish   Note:  This document was prepared using [employer]  software and may include unintentional dictation errors.   Delton PrairieSmith, Crewe Heathman, MD 06/23/20 2033

## 2020-06-23 NOTE — Discharge Instructions (Signed)
We did blood work, urine testing, heart testing and CT scan of her head without evidence of new problems.  This seems to be a progression of her dementia.   Please follow-up with Dr. Dareen Piano, as well as the neurologist.  If she has any further falls, please return to the ED.

## 2020-06-23 NOTE — ED Triage Notes (Signed)
Pt states after getting up and using the BR last night she became dizzy, confused with generalized weakness, states she is still having some of the sx of some weakness , states she did fall last night and hit head, pt c/o left shoulder pain.

## 2020-06-26 ENCOUNTER — Ambulatory Visit (INDEPENDENT_AMBULATORY_CARE_PROVIDER_SITE_OTHER): Payer: Medicare Other | Admitting: Counselor

## 2020-06-26 ENCOUNTER — Other Ambulatory Visit: Payer: Self-pay

## 2020-06-26 ENCOUNTER — Encounter: Payer: Self-pay | Admitting: Counselor

## 2020-06-26 DIAGNOSIS — F0390 Unspecified dementia without behavioral disturbance: Secondary | ICD-10-CM

## 2020-06-26 DIAGNOSIS — R41840 Attention and concentration deficit: Secondary | ICD-10-CM | POA: Diagnosis not present

## 2020-06-26 DIAGNOSIS — F3181 Bipolar II disorder: Secondary | ICD-10-CM | POA: Diagnosis not present

## 2020-06-26 NOTE — Patient Instructions (Addendum)
Your performance and presentation on assessment were consistent with numerous low scores in different areas. You clearly have a very difficult time focusing on things, applying yourself, and likely reduced your scores rendering them conservative estimates of your actual abilities. Nevertheless, you did have significant areas of low performance on measures that are known to be diagnostic of Alzheimer's clinical syndrome. I would like to highlight that the history you provided, which is always the most important part of the workup, is challenging to interpret and that further investigation is needed before coming to a definitive diagnosis, and therefore the best diagnosis at this point in time is Major neurocognitive disorder, due to an unspecified etiology.   Major neurocognitive disorder is the term for changes in memory and thinking that are sufficiently severe as to compromise your capacity for independence. This term is typically used to denote permanent causes of cognitive impairment such as head injury, stroke, or dementia due to a neurodegenerative process. In your case, there is some question as to whether your difficulties are due to an underlying condition (most likely Alzheimer's disease) or simple medication side effects, psychiatric problems, and other factors. This is an important difference because if your difficulties are due to an underlying condition, then they will worsen over time. Therefore I suggest that you obtain further workup.   Dr. Elna Breslow has referred you for neurological evaluation, at [facility], which I think is the next logical step. This is a doctor who specializes in problems of the brain and can further investigate your issues. Specifically, it would be helpful to have higher resolution neuroimaging, such as an MRI of the brain and to check for any movement abnormalities. If diagnostic certainty still remains after that and there continues to be concern about Alzheimer's  disease, it would be reasonable to pursue other studies such as a lumbar puncture for CSF biomarkers, which can identify proteins that are diagnostic of Alzheimer's disease and would confirm or refute that diagnosis. If all these studies are negative, then it is possible that psychiatric problems, aging in the setting of major mental illness, and medication side effects are the main contributors and that your issues could improve if these things were addressed.   I am concerned that your psychiatric medication regimen is likely contributing significantly to your cognitive problems. If you do have an underlying dementia, you should know that many psychiatric medications can contribute to changes in memory and thinking, because they are not tolerated as well as in nondemented individuals. Only you and your prescribing provider can weigh the costs and benefits of your various medications but it is important to understand the decision at hand so that you can make the most informed decision.   At this point, I recommend that your husband or other trusted representatives provide you with assistance as needed with complex and memory dependent activities, including things like making decisions of high risk, managing money, and the like.   You should not drive due to notable difficulties with attention regulation.

## 2020-06-26 NOTE — Progress Notes (Signed)
NEUROPSYCHOLOGY FEEDBACK NOTE [facility]  Feedback Note: I met with Jodi Parrish to review the findings resulting from her neuropsychological evaluation. Since the last appointment, she was unfortunately in the ED. She was feeling weak and fell. Her husband stated that her legs just "go out." She was in the bathroom, was feeling unstable, and he was holding her and she simply dropped. She was then too weak to lift herself off the floor and stayed on the floor for several hours until she needed to go to the bathroom. When he was not able to get her up, her husband called EMS and they transported her to [place]. No acute cause was determined. She appears tremulous in clinic today (postural/action tremor) and is ambulating using a wheelchair. This further reinforces the need for neurology consultation.  Time was spent reviewing the impressions and recommendations that are detailed in the evaluation report. We discussed the fact that given lack of history of decline, because she was already in a minimally functional state, and her significant somewhat amorphous mental health issues, diagnosis is challenging. 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 I also reviewed her CT scan, which similar to the others does seem to show a fair amount of volume loss that may be more pronounced in the temporal and frontal areas. Her temporal volume loss is more notable on the right and better evaluated on this study. I counseled them about their workup thus far, the importance of neurological consultation, and that additional workup such as CSF may be needed in her case for definitive diagnosis because of the numerous confounds. I took time to explain the findings and answer all the patient's questions. I encouraged [name]  to contact me should she have any further questions or if further follow up is desired.   Current Medications and Medical History   Current Outpatient Medications  Medication Sig Dispense Refill   acetaminophen (TYLENOL) 650 MG CR tablet Take 650 mg by mouth every 8 (eight) hours as needed for pain.     amoxicillin (AMOXIL) 500 MG capsule Take 500 mg by mouth daily. PRIOR TO DENTAL APPOINTMENT     bisacodyl (DULCOLAX) 10 MG suppository Place 1 suppository (10 mg total) rectally as needed for moderate constipation. 12 suppository 0   Cholecalciferol (VITAMIN D-3) 5000 units TABS Take 1 tablet by mouth daily.     citalopram (CELEXA) 20 MG tablet Take 0.5 tablets (10 mg total) by mouth daily. 7 tablet 0   dicyclomine (BENTYL) 10 MG capsule Take 1 capsule (10 mg total) by mouth 4 (four) times daily for 14 days. 56 capsule 0   folic acid (FOLVITE) 761 MCG tablet Take 800 mcg by mouth daily.     gabapentin (NEURONTIN) 300 MG capsule Take 300 mg by mouth 3 (three) times daily.     lamoTRIgine (LAMICTAL) 200 MG tablet Take 200 mg by mouth at bedtime.     magnesium 30 MG tablet Take 250 mg by mouth daily.     Multiple Vitamins-Minerals (MULTIVITAMIN WITH MINERALS) tablet Take 1 tablet by mouth daily.     neomycin-polymyxin-dexameth (MAXITROL) 0.1 % OINT 1 application. (Patient not taking: Reported on 04/28/2020)     polyethylene glycol (MIRALAX) 17 g packet Take 17 g by mouth daily. 14 each 0   pravastatin (PRAVACHOL) 40 MG tablet  Take 40 mg by mouth at bedtime.     propranolol ER (INDERAL LA) 60 MG 24 hr capsule Take 60 mg by mouth daily.     spironolactone (ALDACTONE) 100 MG tablet Take 100 mg by mouth at bedtime.     traZODone (DESYREL) 100 MG tablet Take 200 mg by mouth at bedtime.     vitamin B-12 (CYANOCOBALAMIN) 1000 MCG tablet Take 1,000 mcg by mouth daily. (Patient not taking: Reported on 04/28/2020)     vortioxetine HBr (TRINTELLIX) 20 MG TABS tablet Take 0.5-1 tablets  (10-20 mg total) by mouth as directed. Start taking half tablet ( 10 mg ) daily for 2 weeks and increase to 1 tablet ( 20 mg ) daily after that 30 tablet 1   No current facility-administered medications for this visit.    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/2020   Elevated C-reactive protein (CRP) 04/13/2020   Cervical spondylosis without myelopathy 04/13/2020   DDD (degenerative disc disease), cervical 04/13/2020   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, C5-6) (Left) 04/13/2020   Cervical kyphosis 04/13/2020   Osteoarthritis of glenohumeral joints (Bilateral) 04/13/2020   Osteoarthritis of AC (acromioclavicular) joint (Right) 04/13/2020   Thoracic scoliosis (Right) 04/13/2020   Thoracic spondylosis without myelopathy 04/13/2020   DDD (degenerative disc disease), thoracic 04/13/2020   DDD (degenerative disc disease), lumbosacral 04/13/2020   Lumbar levoscoliosis 04/13/2020   Lumbar Grade 1 Retrolisthesis of L1/L2 04/13/2020   Severe Narrowing of lumbar intervertebral disc space from L1-4 (Right) 04/13/2020   Renal cyst 04/13/2020   Abnormal MRI, lumbar spine (05/18/2018) 04/13/2020   Lumbosacral facet hypertrophy (Multilevel) (Bilateral) 04/13/2020   Lumbosacral facet syndrome (Multilevel) (Bilateral) (L>R) 04/13/2020   Chronic generalized pain 01/29/2020   Depression 01/29/2020   Chronic low back pain (1ry area of Pain) (Bilateral) (R>L) w/o sciatica 01/29/2020   Chronic feet pain (2ry area of Pain) (Bilateral) (R>L) 01/29/2020   Chronic shoulder pain (3ry area of Pain) (Bilateral) (L>R) 01/29/2020   Chronic upper extremity pain (4th area of Pain) (Bilateral) (L>R) 01/29/2020     Chronic mid back pain (5th area of Pain) (Bilateral) (L>R) 01/29/2020   Cervicalgia 01/29/2020   Chronic neck pain (6th area of Pain) (Bilateral) (R>L) 01/29/2020   Chronic knee pain (8th area of Pain) (Bilateral) (L>R) 01/29/2020   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) 01/29/2020   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) 01/29/2020   Chronic hip pain (Bilateral) (L>R) 01/29/2020   Aphasia 01/29/2020   Chronic pain syndrome 01/27/2020   Pharmacologic therapy 01/27/2020   Disorder of skeletal system 01/27/2020   Problems influencing health status 01/27/2020   Nonexertional chest pain 10/08/2019   Osteoarthritis of shoulder (SEVERE) (Left) 06/10/2019   Anxiety 04/04/2019   Benign esophageal stricture 07/19/2018   Esophageal dysphagia 07/19/2018   Use of cane as ambulatory aid 07/13/2018   Healthcare maintenance 02/14/2018   Hypercholesterolemia 02/16/2017   Osteoarthritis of hip (Right) 01/10/2017   Bipolar 1 disorder ([place]) 10/31/2016   Essential hypertension 10/31/2016   Generalized osteoarthritis of multiple sites 10/31/2016    Mental Status and Behavioral Observations  Jodi Parrish presented on time to the present encounter and was alert. Orientation was not formally assessed, she presented  as mildly disoriented, as she did at the last encounter. Speech was soft albeit normal in rate, rhythm, volume, and prosody. Self-reported mood was "ok" and affect was dysphoric, with moments of tearfulness at times. Thought process was tangential, she had a hard time responding pertinently to questions. Thought content was appropriate. There were no safety concerns identified at today's encounter, such as thoughts of harming self or others.   Plan  Feedback provided regarding the patient's neuropsychological evaluation. She has a very complex history given her mental health issues and she is on some potentially cognitively impairing  medications. Nevertheless, I am concerned that she may have a neurodegenerative condition given her presentation and test performance. Ascertaining that with a reasonable degree of certainty will likely require further workup, as the condition cannot be diagnosed from behavioral testing alone. Her recent movement symptoms also need to be better investigated. I encouraged them to follow up with [facility] regarding her evaluation. Jodi Parrish was encouraged to contact me if any questions arise or if further follow up is desired.   Viviano Simas Nicole Kindred, PsyD, ABN Clinical Neuropsychologist  Service(s) Provided at This Encounter: 62 minutes [phone]; Psychotherapy with patient/family)

## 2020-07-13 ENCOUNTER — Encounter: Payer: Self-pay | Admitting: Emergency Medicine

## 2020-07-13 ENCOUNTER — Other Ambulatory Visit: Payer: Self-pay

## 2020-07-13 ENCOUNTER — Emergency Department
Admission: EM | Admit: 2020-07-13 | Discharge: 2020-07-13 | Disposition: A | Discharge status: Home / Self Care | Payer: Medicare Other | Attending: Emergency Medicine | Admitting: Emergency Medicine

## 2020-07-13 DIAGNOSIS — R251 Tremor, unspecified: Secondary | ICD-10-CM | POA: Diagnosis not present

## 2020-07-13 DIAGNOSIS — M6281 Muscle weakness (generalized): Secondary | ICD-10-CM | POA: Insufficient documentation

## 2020-07-13 DIAGNOSIS — I10 Essential (primary) hypertension: Secondary | ICD-10-CM | POA: Insufficient documentation

## 2020-07-13 DIAGNOSIS — Z87891 Personal history of nicotine dependence: Secondary | ICD-10-CM | POA: Insufficient documentation

## 2020-07-13 DIAGNOSIS — Z96652 Presence of left artificial knee joint: Secondary | ICD-10-CM | POA: Insufficient documentation

## 2020-07-13 DIAGNOSIS — H538 Other visual disturbances: Secondary | ICD-10-CM | POA: Insufficient documentation

## 2020-07-13 DIAGNOSIS — Z96643 Presence of artificial hip joint, bilateral: Secondary | ICD-10-CM | POA: Diagnosis not present

## 2020-07-13 DIAGNOSIS — R531 Weakness: Secondary | ICD-10-CM

## 2020-07-13 LAB — CBC
HCT: 41.1 % (ref 36.0–46.0)
Hemoglobin: 13.5 g/dL (ref 12.0–15.0)
MCH: 30.5 pg (ref 26.0–34.0)
MCHC: 32.8 g/dL (ref 30.0–36.0)
MCV: 93 fL (ref 80.0–100.0)
Platelets: 232 10*3/uL (ref 150–400)
RBC: 4.42 MIL/uL (ref 3.87–5.11)
RDW: 13.3 % (ref 11.5–15.5)
WBC: 7.1 10*3/uL (ref 4.0–10.5)
nRBC: 0 % (ref 0.0–0.2)

## 2020-07-13 LAB — BASIC METABOLIC PANEL
Anion gap: 9 (ref 5–15)
BUN: 9 mg/dL (ref 8–23)
CO2: 28 mmol/L (ref 22–32)
Calcium: 9.9 mg/dL (ref 8.9–10.3)
Chloride: 98 mmol/L (ref 98–111)
Creatinine, Ser: 1.03 mg/dL — ABNORMAL HIGH (ref 0.44–1.00)
GFR, Estimated: 57 mL/min — ABNORMAL LOW (ref >60)
Glucose, Bld: 108 mg/dL — ABNORMAL HIGH (ref 70–99)
Potassium: 4 mmol/L (ref 3.5–5.1)
Sodium: 135 mmol/L (ref 135–145)

## 2020-07-13 NOTE — Discharge Instructions (Addendum)
The neuropsychologist that you saw recommends that you follow-up with a neurologist for further testing.  You should contact the psychiatrist Dr. Elna Breslow and/or your primary care doctor to coordinate this.  We have also given information for one of our neurologists.  Return to the ER immediately for new, worsening, or persistent weakness or falls, difficulty walking or speaking, vision changes, fever or chills, or any other new or worsening symptoms that concern you.

## 2020-07-13 NOTE — ED Provider Notes (Signed)
[facility] Emergency Department Provider Note ____________________________________________   First MD Initiated Contact with Patient 07/13/20 1316     (approximate)  I have reviewed the triage vital signs and the nursing notes.   HISTORY  Chief Complaint Blurred Vision and Shakiness   HPI Jodi Parrish is a 74 y.o. female with PMH as noted below who presents with an episode of generalized weakness which started in her arms and moved to her legs.  It is unclear exactly how long it lasted.  The husband reports that this happened during the night when she went to go to the bathroom.  She had difficulty getting out of the bed.  He states that he was able to hold her up and prevent her from falling.  She has had multiple episodes like this in the past, most recently a few weeks ago.  She states that she now feels back to normal although she was somewhat shaky when she arrived to the ED.  The patient is  currently being worked up for dementia or neurodegenerative disease.  She has seen a neuropsychologist although has not yet seen a neurologist.  She had a CT of the head earlier this month.  The patient denies fever or chills, vomiting, diarrhea, shortness of breath, chest pain, or any other acute pain.  Past Medical History:  Diagnosis Date  . Ankle fracture    right  . Anxiety   . Arthritis   . Bipolar disorder (HCC)   . Depression   . Diverticulitis   . Hyperlipidemia   . Hypertension   . Osteoarthritis   . Overactive bladder     Patient Active Problem List   Diagnosis Date Noted  . Panic attacks 04/28/2020  . Cognitive disorder 04/28/2020  . History of bipolar disorder 04/28/2020  . Alcohol use disorder, severe, in sustained remission (HCC) 04/28/2020  . Osteoarthritis involving multiple joints 04/13/2020  . 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 Elevated C-reactive protein (CRP) 04/13/2020  . Cervical spondylosis without myelopathy  04/13/2020  . DDD (degenerative disc disease), cervical 04/13/2020  . 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, C5-6) (Left) 04/13/2020  . Cervical kyphosis 04/13/2020  . Osteoarthritis of glenohumeral joints (Bilateral) 04/13/2020  . Osteoarthritis of AC (acromioclavicular) joint (Right) 04/13/2020  . Thoracic scoliosis (Right) 04/13/2020  . Thoracic spondylosis without myelopathy 04/13/2020  . DDD (degenerative disc disease), thoracic 04/13/2020  . DDD (degenerative disc disease), lumbosacral 04/13/2020  . Lumbar levoscoliosis 04/13/2020  . Lumbar Grade 1 Retrolisthesis of L1/L2 04/13/2020  . Severe Narrowing of lumbar intervertebral disc space from L1-4 (Right) 04/13/2020  . Renal cyst 04/13/2020  . Abnormal MRI, lumbar spine (05/18/2018) 04/13/2020  . Lumbosacral facet hypertrophy (Multilevel) (Bilateral) 04/13/2020  . Lumbosacral facet syndrome (Multilevel) (Bilateral) (L>R) 04/13/2020  . Chronic generalized pain 01/29/2020  . Depression 01/29/2020  . Chronic low back pain (1ry area of Pain) (Bilateral) (R>L) w/o sciatica 01/29/2020  . Chronic feet pain (2ry area of Pain) (Bilateral) (R>L) 01/29/2020  . Chronic shoulder pain (3ry area of Pain) (Bilateral) (L>R) 01/29/2020  . Chronic upper extremity pain (4th area of Pain) (Bilateral) (L>R) 01/29/2020  . Chronic mid back pain (5th area of Pain) (Bilateral) (L>R) 01/29/2020  . Cervicalgia 01/29/2020  . Chronic neck pain (6th area of Pain) (Bilateral) (R>L) 01/29/2020  . Chronic knee pain (8th area of Pain) (Bilateral) (L>R) 01/29/2020  . 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 Chronic hip pain after THR (total hip  replacement) (Bilateral) 01/29/2020  . Chronic hip pain (Bilateral) (L>R) 01/29/2020  . Aphasia 01/29/2020  . Chronic pain syndrome 01/27/2020  . Pharmacologic therapy 01/27/2020  . Disorder of skeletal system 01/27/2020  . Problems influencing health status 01/27/2020  . Nonexertional chest pain  10/08/2019  . Osteoarthritis of shoulder (SEVERE) (Left) 06/10/2019  . Anxiety 04/04/2019  . Benign esophageal stricture 07/19/2018  . Esophageal dysphagia 07/19/2018  . Use of cane as ambulatory aid 07/13/2018  . Healthcare maintenance 02/14/2018  . Hypercholesterolemia 02/16/2017  . Osteoarthritis of hip (Right) 01/10/2017  . Bipolar 1 disorder (HCC) 10/31/2016  . Essential hypertension 10/31/2016  . Generalized osteoarthritis of multiple sites 10/31/2016    Past Surgical History:  Procedure Laterality Date  . ABDOMINAL HYSTERECTOMY  1998  . BARTHOLIN GLAND CYST EXCISION  1976  . COLONOSCOPY    . COLONOSCOPY WITH PROPOFOL N/A 04/10/2018   Procedure: COLONOSCOPY WITH PROPOFOL;  Surgeon: Toledo, Boykin Nearing, MD;  Location: [facility] ENDOSCOPY;  Service: Gastroenterology;  Laterality: N/A;  . ELBOW SURGERY Right 1995   crushed elbow with hardware implanted  . ESOPHAGOGASTRODUODENOSCOPY (EGD) WITH PROPOFOL N/A 04/10/2018   Procedure: ESOPHAGOGASTRODUODENOSCOPY (EGD) WITH PROPOFOL;  Surgeon: Toledo, Boykin Nearing, MD;  Location: [facility] ENDOSCOPY;  Service: Gastroenterology;  Laterality: N/A;  . JOINT REPLACEMENT Left 2011   knee  . JOINT REPLACEMENT Right 2009   elbow  . KNEE SURGERY Left 2000  . PELVIC FLOOR REPAIR  2009   PELVIC FLOOR LIFT  . REPLACEMENT TOTAL KNEE Left 2016  . TONSILLECTOMY  1958  . TOTAL HIP ARTHROPLASTY Left 2008  . TOTAL HIP ARTHROPLASTY Right 01/10/2017   Procedure: TOTAL HIP ARTHROPLASTY ANTERIOR APPROACH;  Surgeon: Kennedy Bucker, MD;  Location: [facility] ORS;  Service: Orthopedics;  Laterality: Right;    Prior to Admission medications   Medication Sig Start Date End Date Taking? Authorizing Provider  acetaminophen (TYLENOL) 650 MG CR tablet Take 650 mg by mouth every 8 (eight) hours as needed for pain.    [provider]  amoxicillin (AMOXIL) 500 MG capsule Take 500 mg by mouth daily. PRIOR TO DENTAL APPOINTMENT    [provider]  bisacodyl (DULCOLAX) 10  MG suppository Place 1 suppository (10 mg total) rectally as needed for moderate constipation. 03/17/20   Jene Every, MD  Cholecalciferol (VITAMIN D-3) 5000 units TABS Take 1 tablet by mouth daily.    [provider]  citalopram (CELEXA) 20 MG tablet Take 0.5 tablets (10 mg total) by mouth daily. 04/28/20   Jomarie Longs, MD  dicyclomine (BENTYL) 10 MG capsule Take 1 capsule (10 mg total) by mouth 4 (four) times daily for 14 days. 03/17/20 03/31/20  Jene Every, MD  folic acid (FOLVITE) 800 MCG tablet Take 800 mcg by mouth daily.    [provider]  gabapentin (NEURONTIN) 300 MG capsule Take 300 mg by mouth 3 (three) times daily. 12/03/19   [provider]  lamoTRIgine (LAMICTAL) 200 MG tablet Take 200 mg by mouth at bedtime.    [provider]  magnesium 30 MG tablet Take 250 mg by mouth daily.    [provider]  Multiple Vitamins-Minerals (MULTIVITAMIN WITH MINERALS) tablet Take 1 tablet by mouth daily.    [provider]  neomycin-polymyxin-dexameth (MAXITROL) 0.1 % OINT 1 application. Patient not taking: Reported on 04/28/2020    [provider]  polyethylene glycol (MIRALAX) 17 g packet Take 17 g by mouth daily. 03/17/20   Jene Every, MD  pravastatin (PRAVACHOL) 40 MG tablet  Take 40 mg by mouth at bedtime.    [provider]  propranolol ER (INDERAL LA) 60 MG 24 hr capsule Take 60 mg by mouth daily.    [provider]  spironolactone (ALDACTONE) 100 MG tablet Take 100 mg by mouth at bedtime.    [provider]  traZODone (DESYREL) 100 MG tablet Take 200 mg by mouth at bedtime.    [provider]  vitamin B-12 (CYANOCOBALAMIN) 1000 MCG tablet Take 1,000 mcg by mouth daily. Patient not taking: Reported on 04/28/2020    [provider]  vortioxetine HBr (TRINTELLIX) 20 MG TABS tablet Take 0.5-1 tablets (10-20 mg total) by mouth as directed. Start taking half tablet ( 10 mg ) daily for  2 weeks and increase to 1 tablet ( 20 mg ) daily after that 04/28/20   Jomarie Longs, MD    Allergies Budesonide-formoterol fumarate, Abilify [aripiprazole], and Duloxetine  Family History  Problem Relation Age of Onset  . Parkinson's disease Mother   . CAD Father   . Hypertension Father   . Anxiety disorder Daughter   . Depression Daughter   . Suicidality Daughter   . Alcohol abuse Daughter   . Anxiety disorder Daughter   . Anxiety disorder Son   . Breast cancer Neg Hx     Social History Social History   Tobacco Use  . Smoking status: Former Smoker    Types: Cigarettes  . Smokeless tobacco: Never Used  Vaping Use  . Vaping Use: Never used  Substance Use Topics  . Alcohol use: Not Currently    Comment: HEAVY USE IN THE PAST- QUIT 3 YEARS AGO  . Drug use: No    Review of Systems  Constitutional: No fever/chills. Eyes: No redness. ENT: No sore throat. Cardiovascular: Denies chest pain. Respiratory: Denies shortness of breath. Gastrointestinal: No vomiting or diarrhea.  Genitourinary: Negative for dysuria.  Musculoskeletal: Negative for back pain. Skin: Negative for rash. Neurological: Negative for headaches, focal weakness or numbness.   ____________________________________________   PHYSICAL EXAM:  VITAL SIGNS: ED Triage Vitals  Enc Vitals Group     BP 07/13/20 1008 (!) 157/87     Pulse Rate 07/13/20 1008 75     Resp 07/13/20 1008 16     Temp 07/13/20 1008 98.3 F (36.8 C)     Temp Source 07/13/20 1008 Oral     SpO2 07/13/20 1008 98 %     Weight 07/13/20 1033 158 lb (71.7 kg)     Height 07/13/20 1033 5\' 8"  (1.727 m)     Head Circumference --      Peak Flow --      Pain Score 07/13/20 1033 7     Pain Loc --      Pain Edu? --      Excl. in GC? --     Constitutional: Alert and oriented.  Relatively well appearing and in no acute distress. Eyes: Conjunctivae are normal.  EOMI.  PERRLA. Head: Atraumatic. Nose: No  congestion/rhinnorhea. Mouth/Throat: Mucous membranes are slightly dry.   Neck: Normal range of motion.  Cardiovascular: Normal rate, regular rhythm. Good peripheral circulation. Respiratory: Normal respiratory effort.  No retractions.  Gastrointestinal: No distention.  Musculoskeletal: Extremities warm and well perfused.  Neurologic:  Normal speech and language.  5/5 motor strength and intact sensation to all extremities.  Normal coordination on finger-to-nose.  No pronator drift.  No facial droop. Skin:  Skin is warm and dry. No rash noted. Psychiatric: Mood and affect  are normal. Speech and behavior are normal.  ____________________________________________   LABS (all labs ordered are listed, but only abnormal results are displayed)  Labs Reviewed  BASIC METABOLIC PANEL - Abnormal; Notable for the following components:      Result Value   Glucose, Bld 108 (*)    Creatinine, Ser 1.03 (*)    GFR, Estimated 57 (*)    All other components within normal limits  CBC   ____________________________________________  EKG  ED ECG REPORT I, Dionne BucySebastian Jeffifer Rabold, the attending physician, personally viewed and interpreted this ECG.  Date: 07/13/2020 EKG Time: 1031 Rate: 73 Rhythm: normal sinus rhythm (incorrectly read by machine as junctional rhythm) QRS Axis: normal Intervals: normal ST/T Wave abnormalities: normal Narrative Interpretation: no evidence of acute ischemia  ____________________________________________  RADIOLOGY    ____________________________________________   PROCEDURES  Procedure(s) performed: No  Procedures  Critical Care performed: No ____________________________________________   INITIAL IMPRESSION / ASSESSMENT AND PLAN / ED COURSE  Pertinent labs & imaging results that were available during my care of the patient were reviewed by me and considered in my medical decision making (see chart for details).  74 year old female with PMH as noted  above, currently undergoing work-up for possible dementia or neurodegenerative disease presents with an episode of generalized weakness, seemingly starting in her upper body and going to her lower body.  It almost caused her to fall but her husband caught her.  They report several similar episodes like this over the last few months.  The patient overall has not had any acute change in her symptoms or behavior.  I reviewed the past medical records in epic.  The patient recently had neuropsychology evaluation by Dr. Roseanne RenoStewart with concern for a neurodegenerative condition.  He recommended that she follow-up with neurology for further work-up.  She had a CT head on 10/5 which was negative for acute findings.  She was also seen in the ED that day after a similar presentation with 2 falls in the last few days.  Currently, her symptoms have resolved.  On exam she is well-appearing.  Her vital signs are normal except for mild hypertension.  There are no neurologic exam is normal.  The patient is alert and at her baseline mental status.  The remainder of the exam is unremarkable.  Overall I suspect that these recurrent brief episodes of falls and weakness are likely related to the underlying condition that she is being worked up for.  This could be dementia with some movement component.  Given that she is back to her baseline, with no overall worsening or escalation of her symptoms in the last several days, there is no evidence of other acute process (such as infection) or any indication for extensive ED work-up.  Basic labs were obtained from triage and are within normal limits.  At this time, there is no indication for repeat imaging given that she did not fall or hit her head and her neurologic exam is normal.  I had a discussion with the patient and her husband.  They feel comfortable going home.  I emphasized the importance of following up with neurology.  They were waiting for referral from their psychiatrist.   I also provided referral information directly.  I gave thorough return precautions, and they expressed understanding.    ____________________________________________   FINAL CLINICAL IMPRESSION(S) / ED DIAGNOSES  Final diagnoses:  Generalized weakness      NEW MEDICATIONS STARTED DURING THIS VISIT:  New Prescriptions   No medications on  file     Note:  This document was prepared using Dragon voice recognition software and may include unintentional dictation errors.    Dionne Bucy, MD 07/13/20 1425

## 2020-07-13 NOTE — ED Triage Notes (Signed)
Pt via POV from home. Pt states she has been "shakey" for the past 2 days. Pt states she cannot think straight and her vision is blurry and cannot read anything since being in the triage room. On arrival, pt is very uneasy and jittery during triage. Pt states, "Im sorry I am very nervous"

## 2020-07-15 ENCOUNTER — Other Ambulatory Visit: Payer: Self-pay | Admitting: Psychiatry

## 2020-07-15 DIAGNOSIS — F410 Panic disorder [episodic paroxysmal anxiety] without agoraphobia: Secondary | ICD-10-CM

## 2020-07-15 DIAGNOSIS — F32A Depression, unspecified: Secondary | ICD-10-CM

## 2020-07-20 ENCOUNTER — Telehealth (INDEPENDENT_AMBULATORY_CARE_PROVIDER_SITE_OTHER): Payer: Medicare Other | Admitting: Psychiatry

## 2020-07-20 ENCOUNTER — Other Ambulatory Visit: Payer: Self-pay

## 2020-07-20 ENCOUNTER — Encounter: Payer: Self-pay | Admitting: Psychiatry

## 2020-07-20 DIAGNOSIS — F0390 Unspecified dementia without behavioral disturbance: Secondary | ICD-10-CM | POA: Diagnosis not present

## 2020-07-20 DIAGNOSIS — Z8659 Personal history of other mental and behavioral disorders: Secondary | ICD-10-CM

## 2020-07-20 DIAGNOSIS — F410 Panic disorder [episodic paroxysmal anxiety] without agoraphobia: Secondary | ICD-10-CM

## 2020-07-20 MED ORDER — QUETIAPINE FUMARATE 25 MG PO TABS: 12.5000 mg | ORAL_TABLET | Freq: Every day | ORAL | 1 refills | Status: DC | PRN

## 2020-07-20 MED ORDER — QUETIAPINE FUMARATE 50 MG PO TABS: 50.0000 mg | ORAL_TABLET | Freq: Every day | ORAL | 1 refills | Status: DC

## 2020-07-20 NOTE — Progress Notes (Signed)
Virtual Visit via Video Note  I connected with JASMANE BROCKWAY Ryke on 07/20/20 at 10:00 AM EDT by a video enabled telemedicine application and verified that I am speaking with the correct person using two identifiers.  Location Provider Location : [facility] Patient Location : Home  Participants: Patient ,Spouse, Provider    I discussed the limitations of evaluation and management by telemedicine and the availability of in person appointments. The patient expressed understanding and agreed to proceed.     I discussed the assessment and treatment plan with the patient. The patient was provided an opportunity to ask questions and all were answered. The patient agreed with the plan and demonstrated an understanding of the instructions.   The patient was advised to call back or seek an in-person evaluation if the symptoms worsen or if the condition fails to improve as anticipated.   BH MD OP Progress Note  07/21/2020 8:22 AM Virgil Slinger  MRN:  [id number]  Chief Complaint:  Chief Complaint    Follow-up     HPI: Kenzie Thoreson is a 74 year old Caucasian female, married, lives in [place], has a history of bipolar disorder type II, anxiety disorder, cognitive disorder, insomnia, alcohol use disorder, hyperlipidemia, chronic pain, arthritis, hypertension, multiple surgeries including knee replacement, hip replacement, overactive bladder was evaluated by telemedicine today.  Patient continues to be a limited historian.  Hence information was also obtained from her husband-Steven.  Patient appeared to be tearful and labile in session today.  Patient could not answer questions appropriately.  She also had trouble giving her home address.  She however could give her date of birth correctly.  Patient when asked about how her mood was just reported she was not feeling good and started crying.  Her husband continues to believe she is irritable and having mood swings often.  According to husband patient  also has anxiety attacks on and off.  Patient has episodes when she goes into a panic mode, and falls onto the floor.  She had to be taken to the emergency department for the same recently.  Husband believes this is due to her anxiety.  He also reports sleep is affected at night.  She is currently taking her Trintellix.  That does help to some extent.  Denies side effects.  Patient did not express any suicidality, homicidality.  However patient does appear to be paranoid.  According to husband patient does have trust issues.  Patient recently completed psychological testing and was diagnosed with dementia likely Alzheimer's.  Discussed and reviewed neuropsychological testing  with patient as well as husband.  Patient has not yet made an appointment with neurology.  Discussed with patient as well as husband to establish care with neurology, and that she was referred by of clinic again.  Visit Diagnosis:    ICD-10-CM   1. Major neurocognitive disorder (HCC)  F03.90 QUEtiapine (SEROQUEL) 50 MG tablet   unspecified,likely Alzheimer's  2. Panic attacks  F41.0 QUEtiapine (SEROQUEL) 50 MG tablet    QUEtiapine (SEROQUEL) 25 MG tablet  3. History of bipolar disorder  Z86.59 QUEtiapine (SEROQUEL) 50 MG tablet    Past Psychiatric History: I have reviewed past psychiatric history from my progress note on 04/28/2020.  Past trials of Celexa, Prozac, Zoloft, Cymbalta, Abilify, Lamictal, Adderall, trazodone-multiple other medications.  Past Medical History:  Past Medical History:  Diagnosis Date  . Ankle fracture    right  . Anxiety   . Arthritis   . Bipolar disorder (HCC)   .  Depression   . Diverticulitis   . Hyperlipidemia   . Hypertension   . Osteoarthritis   . Overactive bladder     Past Surgical History:  Procedure Laterality Date  . ABDOMINAL HYSTERECTOMY  1998  . BARTHOLIN GLAND CYST EXCISION  1976  . COLONOSCOPY    . COLONOSCOPY WITH PROPOFOL N/A 04/10/2018   Procedure: COLONOSCOPY WITH  PROPOFOL;  Surgeon: Toledo, Boykin Nearing, MD;  Location: [facility];  Service: Gastroenterology;  Laterality: N/A;  . ELBOW SURGERY Right 1995   crushed elbow with hardware implanted  . ESOPHAGOGASTRODUODENOSCOPY (EGD) WITH PROPOFOL N/A 04/10/2018   Procedure: ESOPHAGOGASTRODUODENOSCOPY (EGD) WITH PROPOFOL;  Surgeon: Toledo, Boykin Nearing, MD;  Location: [facility] ENDOSCOPY;  Service: Gastroenterology;  Laterality: N/A;  . JOINT REPLACEMENT Left 2011   knee  . JOINT REPLACEMENT Right 2009   elbow  . KNEE SURGERY Left 2000  . PELVIC FLOOR REPAIR  2009   PELVIC FLOOR LIFT  . REPLACEMENT TOTAL KNEE Left 2016  . TONSILLECTOMY  1958  . TOTAL HIP ARTHROPLASTY Left 2008  . TOTAL HIP ARTHROPLASTY Right 01/10/2017   Procedure: TOTAL HIP ARTHROPLASTY ANTERIOR APPROACH;  Surgeon: Kennedy Bucker, MD;  Location: [facility] ORS;  Service: Orthopedics;  Laterality: Right;    Family Psychiatric History: I have reviewed family psychiatric history from my progress note on 04/28/2020  Family History:  Family History  Problem Relation Age of Onset  . Parkinson's disease Mother   . CAD Father   . Hypertension Father   . Anxiety disorder Daughter   . Depression Daughter   . Suicidality Daughter   . Alcohol abuse Daughter   . Anxiety disorder Daughter   . Anxiety disorder Son   . Breast cancer Neg Hx     Social History: Reviewed social history from my progress note on 04/28/2020 Social History   Socioeconomic History  . Marital status: Married    Spouse name: Not on file  . Number of children: Not on file  . Years of education: Not on file  . Highest education level: Not on file  Occupational History  . Not on file  Tobacco Use  . Smoking status: Former Smoker    Types: Cigarettes  . Smokeless tobacco: Never Used  Vaping Use  . Vaping Use: Never used  Substance and Sexual Activity  . Alcohol use: Not Currently    Comment: HEAVY USE IN THE PAST- QUIT 3 YEARS AGO  . Drug use: No  . Sexual activity: Yes   Other Topics Concern  . Not on file  Social History Narrative  . Not on file   Social Determinants of Health   Financial Resource Strain:   . Difficulty of Paying Living Expenses: Not on file  Food Insecurity:   . Worried About Programme researcher, broadcasting/film/video in the Last Year: Not on file  . Ran Out of Food in the Last Year: Not on file  Transportation Needs:   . Lack of Transportation (Medical): Not on file  . Lack of Transportation (Non-Medical): Not on file  Physical Activity:   . Days of Exercise per Week: Not on file  . Minutes of Exercise per Session: Not on file  Stress:   . Feeling of Stress : Not on file  Social Connections:   . Frequency of Communication with Friends and Family: Not on file  . Frequency of Social Gatherings with Friends and Family: Not on file  . Attends Religious Services: Not on file  . Active Member of Clubs  or Organizations: Not on file  . Attends BankerClub or Organization Meetings: Not on file  . Marital Status: Not on file    Allergies:  Allergies  Allergen Reactions  . Budesonide-Formoterol Fumarate     Other reaction(s): Unknown  . Abilify [Aripiprazole] Anxiety    SEVERE HYPERACTIVITY  . Duloxetine Anxiety    Metabolic Disorder Labs: No results found for: HGBA1C, MPG No results found for: PROLACTIN No results found for: CHOL, TRIG, HDL, CHOLHDL, VLDL, LDLCALC Lab Results  Component Value Date   TSH 0.95 09/18/2013    Therapeutic Level Labs: No results found for: LITHIUM No results found for: VALPROATE No components found for:  CBMZ  Current Medications: Current Outpatient Medications  Medication Sig Dispense Refill  . acetaminophen (TYLENOL) 650 MG CR tablet Take 650 mg by mouth every 8 (eight) hours as needed for pain.    Marland Kitchen. amoxicillin (AMOXIL) 500 MG capsule Take 500 mg by mouth daily. PRIOR TO DENTAL APPOINTMENT    . bisacodyl (DULCOLAX) 10 MG suppository Place 1 suppository (10 mg total) rectally as needed for moderate constipation.  12 suppository 0  . Cholecalciferol (VITAMIN D-3) 5000 units TABS Take 1 tablet by mouth daily.    Marland Kitchen. dicyclomine (BENTYL) 10 MG capsule Take 1 capsule (10 mg total) by mouth 4 (four) times daily for 14 days. 56 capsule 0  . folic acid (FOLVITE) 800 MCG tablet Take 800 mcg by mouth daily.    Marland Kitchen. gabapentin (NEURONTIN) 300 MG capsule Take 300 mg by mouth 3 (three) times daily.    Marland Kitchen. lamoTRIgine (LAMICTAL) 200 MG tablet Take 200 mg by mouth at bedtime.    . Multiple Vitamins-Minerals (MULTIVITAMIN WITH MINERALS) tablet Take 1 tablet by mouth daily.    Marland Kitchen. neomycin-polymyxin-dexameth (MAXITROL) 0.1 % OINT 1 application.     . polyethylene glycol (MIRALAX) 17 g packet Take 17 g by mouth daily. 14 each 0  . pravastatin (PRAVACHOL) 40 MG tablet Take 40 mg by mouth at bedtime.    . propranolol ER (INDERAL LA) 60 MG 24 hr capsule Take 60 mg by mouth daily.    Marland Kitchen. spironolactone (ALDACTONE) 100 MG tablet Take 100 mg by mouth at bedtime.    . TRINTELLIX 20 MG TABS tablet TAKE 1/2 TABLET BY MOUTH DAILY FOR 2 WEEKS, THEN INCREASE TO 1 TABLET DAILY AFTER THAT 30 tablet 1  . magnesium 30 MG tablet Take 250 mg by mouth daily. (Patient not taking: Reported on 07/20/2020)    . QUEtiapine (SEROQUEL) 25 MG tablet Take 0.5-1 tablets (12.5-25 mg total) by mouth daily as needed. For agitation and anxiety as needed 30 tablet 1  . QUEtiapine (SEROQUEL) 50 MG tablet Take 1 tablet (50 mg total) by mouth at bedtime. 30 tablet 1  . vitamin B-12 (CYANOCOBALAMIN) 1000 MCG tablet Take 1,000 mcg by mouth daily. (Patient not taking: Reported on 04/28/2020)     No current facility-administered medications for this visit.     Musculoskeletal: Strength & Muscle Tone: UTA Gait & Station: Seated Patient leans: N/A  Psychiatric Specialty Exam: Review of Systems  Unable to perform ROS: Dementia    There were no vitals taken for this visit.There is no height or weight on file to calculate BMI.  General Appearance: Casual  Eye  Contact:  Fair  Speech:  Clear and Coherent  Volume:  Normal  Mood:  Depressed  Affect:  Tearful  Thought Process:  Linear and Descriptions of Associations: Intact  Orientation:  Other:  self, situation  Thought Content: Rumination ,likely paranoid  Suicidal Thoughts:  No  Homicidal Thoughts:  No  Memory:  Immediate;   Fair Recent;   limited Remote;   limited  Judgement:  Poor  Insight:  Shallow  Psychomotor Activity:  Restlessness  Concentration:  Concentration: Fair and Attention Span: Fair  Recall:  [employer] of Knowledge: Fair  Language: Fair  Akathisia:  No  Handed:  Right  AIMS (if indicated): UTA  Assets:  Communication Skills Desire for Improvement Housing Social Support  ADL's:  Intact  Cognition: Impaired,  Moderate  Sleep:  Poor   Screenings: PHQ2-9     [facility]/13/2020 [facility]  PHQ-2 Total Score 0       Assessment and Plan: SYRENA BURGES Ryke is a history of bipolar disorder type II, anxiety disorder, multiple medical problems including chronic pain was evaluated by telemedicine today.  Patient is biologically predisposed given her history of medical problems including chronic pain, history of mental health problems in her family, history of trauma.  Patient with psychosocial stressors of the current pandemic, recent diagnosis of dementia is currently struggling with mood symptoms.  Plan as noted below.  Plan Major neurocognitive disorder likely due to Alzheimer's-unstable I have reviewed neuropsychological testing per Dr. Roseanne Reno dated 06/26/2020-patient with significant area of low performance on measures that are known to be diagnostic of Alzheimer's clinical syndrome.  However further investigation is needed before coming to a definitive diagnosis and therefore the best diagnosis at this point in time is major neurocognitive disorder due to unspecified etiology. Patient was referred for neurology  evaluation-pending. We will continue Trintellix 20 mg p.o. daily for her mood. Lamictal 200 mg p.o. daily at bedtime Gabapentin 300 mg p.o. 3 times daily. Add Seroquel 50 mg p.o. nightly for paranoia, sleep and mood.  Panic attacks-improving Continue Trintellix as prescribed Add Seroquel 12.5 to 25 mg p.o. daily as needed for severe panic symptoms during the day. Continue CBT with therapist  Insomnia-unstable Discontinue trazodone. Start Seroquel 50 mg p.o. nightly.  Alcohol use disorder in remission Patient quit drinking 3 years ago.  Collateral information obtained from husband as noted above.  Follow-up in clinic in 4 weeks or sooner if needed.  I have spent atleast 30 minutes face to face by video with patient today. More than 50 % of the time was spent for preparing to see the patient ( e.g., review of test, records ), obtaining and to review and separately obtained history , ordering medications and test ,psychoeducation and supportive psychotherapy and care coordination,as well as documenting clinical information in electronic health record. This note was generated in part or whole with voice recognition software. Voice recognition is usually quite accurate but there are transcription errors that can and very often do occur. I apologize for any typographical errors that were not detected and corrected.     Jomarie Longs, MD 07/21/2020, 8:22 AM

## 2020-07-21 NOTE — Patient Instructions (Signed)
seroquel Quetiapine tablets What is this medicine? QUETIAPINE (kwe TYE a peen) is an antipsychotic. It is used to treat schizophrenia and bipolar disorder, also known as manic-depression. This medicine may be used for other purposes; ask your health care provider or pharmacist if you have questions. COMMON BRAND NAME(S): Seroquel What should I tell my health care provider before I take this medicine? They need to know if you have any of these conditions:  blockage in your bowel  cataracts  constipation  dehydration  diabetes  difficulty swallowing  glaucoma  heart disease  history of breast cancer  kidney disease  liver disease  low blood counts, like low white cell, platelet, or red cell counts  low blood pressure or dizziness when standing up  Parkinson's disease  previous heart attack  prostate disease  seizures  stomach or intestine problems  suicidal thoughts, plans or attempt; a previous suicide attempt by you or a family member  thyroid disease  trouble passing urine  an unusual or allergic reaction to quetiapine, other medicines, foods, dyes, or preservatives  pregnant or trying to get pregnant  breast-feeding How should I use this medicine? Take this medicine by mouth. Swallow it with a drink of water. Follow the directions on the prescription label. If it upsets your stomach you can take it with food. Take your medicine at regular intervals. Do not take it more often than directed. Do not stop taking except on the advice of your doctor or health care professional. A special MedGuide will be given to you by the pharmacist with each prescription and refill. Be sure to read this information carefully each time. Talk to your pediatrician regarding the use of this medicine in children. While this drug may be prescribed for children as young as 10 years for selected conditions, precautions do apply. Patients over age 73 years may have a stronger  reaction to this medicine and need smaller doses. Overdosage: If you think you have taken too much of this medicine contact a poison control center or emergency room at once. NOTE: This medicine is only for you. Do not share this medicine with others. What if I miss a dose? If you miss a dose, take it as soon as you can. If it is almost time for your next dose, take only that dose. Do not take double or extra doses. What may interact with this medicine? Do not take this medicine with any of the following medications:  cisapride  dronedarone  fluconazole  metoclopramide  pimozide  posaconazole  thioridazine This medicine may also interact with the following medications:  alcohol  antihistamines for allergy cough and cold  antiviral medicines for HIV or AIDS  atropine  certain medicines for bladder problems like oxybutynin, tolterodine  certain medicines for blood pressure  certain medicines for depression, anxiety, or psychotic disturbances  certain medicines for diabetes  certain medicines for stomach problems like dicyclomine, hyoscyamine  certain medicines for travel sickness like scopolamine  certain medicines for Parkinson's disease  certain medicines for seizures like carbamazepine, phenobarbital, phenytoin  cimetidine  erythromycin  ipratropium  other medicines that prolong the QT interval (cause an abnormal heart rhythm) like dofetilide  rifampin  steroid medicines like prednisone or cortisone This list may not describe all possible interactions. Give your health care provider a list of all the medicines, herbs, non-prescription drugs, or dietary supplements you use. Also tell them if you smoke, drink alcohol, or use illegal drugs. Some items may interact with your medicine.  What should I watch for while using this medicine? Visit your health care professional for regular checks on your progress. Tell your health care professional if symptoms do not  start to get better or if they get worse. Do not stop taking except on your health care professional's advice. You may develop a severe reaction. Your health care professional will tell you how much medicine to take. You may need to have an eye exam before and during use of this medicine. This medicine may increase blood sugar. Ask your health care provider if changes in diet or medicines are needed if you have diabetes. Patients and their families should watch out for new or worsening depression or thoughts of suicide. Also watch out for sudden or severe changes in feelings such as feeling anxious, agitated, panicky, irritable, hostile, aggressive, impulsive, severely restless, overly excited and hyperactive, or not being able to sleep. If this happens, especially at the beginning of antidepressant treatment or after a change in dose, call your health care professional. Bonita Quin may get dizzy or drowsy. Do not drive, use machinery, or do anything that needs mental alertness until you know how this medicine affects you. Do not stand or sit up quickly, especially if you are an older patient. This reduces the risk of dizzy or fainting spells. Alcohol may interfere with the effect of this medicine. Avoid alcoholic drinks. This drug can cause problems with controlling your body temperature. It can lower the response of your body to cold temperatures. If possible, stay indoors during cold weather. If you must go outdoors, wear warm clothes. It can also lower the response of your body to heat. Do not overheat. Do not over-exercise. Stay out of the sun when possible. If you must be in the sun, wear cool clothing. Drink plenty of water. If you have trouble controlling your body temperature, call your health care provider right away. What side effects may I notice from receiving this medicine? Side effects that you should report to your doctor or health care professional as soon as possible:  allergic reactions like skin  rash, itching or hives, swelling of the face, lips, or tongue  breathing problems  changes in vision  confusion  elevated mood, decreased need for sleep, racing thoughts, impulsive behavior  eye pain  fast, irregular heartbeat  fever or chills, sore throat  inability to keep still  males: prolonged or painful erection  problems with balance, talking, walking  redness, blistering, peeling, or loosening of the skin, including inside the mouth  seizures  signs and symptoms of high blood sugar such as being more thirsty or hungry or having to urinate more than normal. You may also feel very tired or have blurry vision  signs and symptoms of hypothyroidism like fatigue; increased sensitivity to cold; weight gain; hoarseness; thinning hair  signs and symptoms of low blood pressure like dizziness; feeling faint or lightheaded; falls; unusually weak or tired  signs and symptoms of neuroleptic malignant syndrome like confusion; fast, irregular heartbeat; high fever; increased sweating; stiff muscles  sudden numbness or weakness of the face, arm, or leg  suicidal thoughts or other mood changes  trouble swallowing  uncontrollable movements of the arms, face, head, mouth, neck, or upper body Side effects that usually do not require medical attention (report to your doctor or health care professional if they continue or are bothersome):  change in sex drive or performance  constipation  drowsiness  dry mouth  upset stomach  weight gain This list  may not describe all possible side effects. Call your doctor for medical advice about side effects. You may report side effects to FDA at 1-800-FDA-1088. Where should I keep my medicine? Keep out of the reach of children. Store at room temperature between 15 and 30 degrees C (59 and 86 degrees F). Throw away any unused medicine after the expiration date. NOTE: This sheet is a summary. It may not cover all possible information. If  you have questions about this medicine, talk to your doctor, pharmacist, or health care provider.  2020 Elsevier/Gold Standard (2019-07-03 11:59:11)

## 2020-08-19 ENCOUNTER — Other Ambulatory Visit: Payer: Self-pay | Admitting: Neurology

## 2020-08-19 ENCOUNTER — Other Ambulatory Visit (HOSPITAL_COMMUNITY): Payer: Self-pay | Admitting: Neurology

## 2020-08-19 DIAGNOSIS — R413 Other amnesia: Secondary | ICD-10-CM | POA: Insufficient documentation

## 2020-08-19 DIAGNOSIS — R55 Syncope and collapse: Secondary | ICD-10-CM | POA: Insufficient documentation

## 2020-08-20 ENCOUNTER — Encounter: Payer: Self-pay | Admitting: Psychiatry

## 2020-08-20 ENCOUNTER — Other Ambulatory Visit: Payer: Self-pay

## 2020-08-20 ENCOUNTER — Telehealth (INDEPENDENT_AMBULATORY_CARE_PROVIDER_SITE_OTHER): Payer: Medicare Other | Admitting: Psychiatry

## 2020-08-20 DIAGNOSIS — F0390 Unspecified dementia without behavioral disturbance: Secondary | ICD-10-CM

## 2020-08-20 DIAGNOSIS — F410 Panic disorder [episodic paroxysmal anxiety] without agoraphobia: Secondary | ICD-10-CM

## 2020-08-20 DIAGNOSIS — F330 Major depressive disorder, recurrent, mild: Secondary | ICD-10-CM | POA: Insufficient documentation

## 2020-08-20 DIAGNOSIS — Z8659 Personal history of other mental and behavioral disorders: Secondary | ICD-10-CM

## 2020-08-20 DIAGNOSIS — F32A Depression, unspecified: Secondary | ICD-10-CM | POA: Diagnosis not present

## 2020-08-20 MED ORDER — VORTIOXETINE HBR 20 MG PO TABS: 20.0000 mg | ORAL_TABLET | Freq: Every day | ORAL | 1 refills | Status: DC

## 2020-08-20 NOTE — Progress Notes (Signed)
Virtual Visit via Telephone Note  I connected with Jodi Parrish on 08/20/20 at  9:00 AM EST by telephone and verified that I am speaking with the correct person using two identifiers.  Location Provider Location : [facility] Patient Location : Home  Participants: Patient ,Spouse, Provider    I discussed the limitations, risks, security and privacy concerns of performing an evaluation and management service by telephone and the availability of in person appointments. I also discussed with the patient that there may be a patient responsible charge related to this service. The patient expressed understanding and agreed to proceed.    I discussed the assessment and treatment plan with the patient. The patient was provided an opportunity to ask questions and all were answered. The patient agreed with the plan and demonstrated an understanding of the instructions.   The patient was advised to call back or seek an in-person evaluation if the symptoms worsen or if the condition fails to improve as anticipated.  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  Collateral information was obtained from husband- Viviann Spare.  Patient today appeared to be alert, oriented to person, situation.  She was able to give the correct date.  She however could not give the correct home address and had trouble with it.  She became very anxious since she could not give the right address.  Patient however was  redirectable.  Patient continues to struggle with memory changes, she had her first appointment with neurologist-Dr. Malvin Johns.  Patient reports she is scheduled for MRI of her brain soon.  She reports overall her mood has improved.  She reports she feels she is able to cope with her anxiety and depressive symptoms better.  She reports Seroquel is beneficial for her sleep.  She denies any suicidality, homicidality or perceptual disturbances.  She did not appear to be preoccupied with any delusions or paranoia.  According to husband patient is definitely making progress on the current medication regimen.  He however reports that he had some trouble with getting Trintellix refilled at the pharmacy.  She has has been out for almost a week.  He however reports he will try to talk to the pharmacist and get her back on the Trintellix.  After reviewing most recent prescription sent to the pharmacy, discussed with husband that patient should not have ran out, she likely did not follow the instructions to cut the tablet into half the first 2 weeks.  Visit Diagnosis:    ICD-10-CM   1. Major neurocognitive disorder (HCC)  F03.90 vortioxetine HBr (TRINTELLIX) 20 MG TABS tablet   Likely Alzheimers  2. Panic attacks  F41.0 vortioxetine HBr (TRINTELLIX) 20 MG TABS tablet  3. Depression, unspecified depression type  F32.A   4. History of bipolar disorder  Z86.59 vortioxetine HBr (TRINTELLIX) 20 MG TABS tablet    Past Psychiatric History: I have reviewed past psychiatric history from my progress note on 04/28/2020.  Past trials of Celexa, Prozac, Zoloft, Cymbalta, Abilify, Lamictal, Adderall, trazodone-multiple other medications.  Past Medical History:  Past Medical History:  Diagnosis Date  . Ankle fracture    right  . Anxiety   . Arthritis   . Bipolar disorder (HCC)   . Depression   . Diverticulitis   . Hyperlipidemia   . Hypertension   . Osteoarthritis   . Overactive bladder     Past Surgical  History:  Procedure Laterality Date  . ABDOMINAL HYSTERECTOMY  1998  . BARTHOLIN GLAND CYST EXCISION  1976  . COLONOSCOPY    . COLONOSCOPY WITH PROPOFOL N/A 04/10/2018   Procedure: COLONOSCOPY WITH PROPOFOL;  Surgeon: Toledo, Boykin Nearingeodoro K, MD;  Location: [facility];  Service: Gastroenterology;  Laterality: N/A;  . ELBOW SURGERY Right 1995   crushed elbow with hardware implanted  . ESOPHAGOGASTRODUODENOSCOPY (EGD) WITH PROPOFOL N/A 04/10/2018   Procedure: ESOPHAGOGASTRODUODENOSCOPY (EGD) WITH PROPOFOL;  Surgeon: Toledo, Boykin Nearingeodoro K, MD;  Location: [facility];  Service: Gastroenterology;  Laterality: N/A;  . JOINT REPLACEMENT Left 2011   knee  . JOINT REPLACEMENT Right 2009   elbow  . KNEE SURGERY Left 2000  . PELVIC FLOOR REPAIR  2009   PELVIC FLOOR LIFT  . REPLACEMENT TOTAL KNEE Left 2016  . TONSILLECTOMY  1958  . TOTAL HIP ARTHROPLASTY Left 2008  . TOTAL HIP ARTHROPLASTY Right 01/10/2017   Procedure: TOTAL HIP ARTHROPLASTY ANTERIOR APPROACH;  Surgeon: Kennedy BuckerMichael Menz, MD;  Location: [facility];  Service: Orthopedics;  Laterality: Right;    Family Psychiatric History: I have reviewed family psychiatric history from my progress note on 04/28/2020  Family History:  Family History  Problem Relation Age of Onset  . Parkinson's disease Mother   . CAD Father   . Hypertension Father   . Anxiety disorder Daughter   . Depression Daughter   . Suicidality Daughter   . Alcohol abuse Daughter   . Anxiety disorder Daughter   . Anxiety disorder Son   . Breast cancer Neg Hx     Social History: Reviewed social history from my progress note on 04/28/2020 Social History   Socioeconomic History  . Marital status: Married    Spouse name: Not on file  . Number of children: Not on file  . Years of education: Not on file  . Highest education level: Not on file  Occupational History  . Not on file  Tobacco Use  . Smoking status: Former Smoker    Types: Cigarettes  . Smokeless tobacco: Never  Used  Vaping Use  . Vaping Use: Never used  Substance and Sexual Activity  . Alcohol use: Not Currently    Comment: HEAVY USE IN THE PAST- QUIT 3 YEARS AGO  . Drug use: No  . Sexual activity: Yes  Other Topics Concern  . Not on file  Social History Narrative  . Not on file   Social Determinants of Health   Financial Resource Strain:   . Difficulty of Paying Living Expenses: Not on file  Food Insecurity:   . Worried About Programme researcher, broadcasting/film/videounning Out of Food in the Last Year: Not on file  . Ran Out of Food in the Last Year: Not on file  Transportation Needs:   . Lack of Transportation (Medical): Not on file  . Lack of Transportation (Non-Medical): Not on file  Physical Activity:   . Days of Exercise per Week: Not on file  . Minutes of Exercise per Session: Not on file  Stress:   . Feeling of Stress : Not on file  Social Connections:   . Frequency of Communication with Friends and Family:  Not on file  . Frequency of Social Gatherings with Friends and Family: Not on file  . Attends Religious Services: Not on file  . Active Member of Clubs or Organizations: Not on file  . Attends Banker Meetings: Not on file  . Marital Status: Not on file    Allergies:  Allergies  Allergen Reactions  . Budesonide-Formoterol Fumarate     Other reaction(s): Unknown  . Abilify [Aripiprazole] Anxiety    SEVERE HYPERACTIVITY  . Duloxetine Anxiety    Metabolic Disorder Labs: No results found for: HGBA1C, MPG No results found for: PROLACTIN No results found for: CHOL, TRIG, HDL, CHOLHDL, VLDL, LDLCALC Lab Results  Component Value Date   TSH 0.95 09/18/2013    Therapeutic Level Labs: No results found for: LITHIUM No results found for: VALPROATE No components found for:  CBMZ  Current Medications: Current Outpatient Medications  Medication Sig Dispense Refill  . acetaminophen (TYLENOL) 650 MG CR tablet Take 650 mg by mouth every 8 (eight) hours as needed for pain.    Marland Kitchen amoxicillin  (AMOXIL) 500 MG capsule Take 500 mg by mouth daily. PRIOR TO DENTAL APPOINTMENT    . bisacodyl (DULCOLAX) 10 MG suppository Place 1 suppository (10 mg total) rectally as needed for moderate constipation. 12 suppository 0  . Cholecalciferol (VITAMIN D-3) 5000 units TABS Take 1 tablet by mouth daily.    Marland Kitchen dicyclomine (BENTYL) 10 MG capsule Take 1 capsule (10 mg total) by mouth 4 (four) times daily for 14 days. 56 capsule 0  . folic acid (FOLVITE) 800 MCG tablet Take 800 mcg by mouth daily.    Marland Kitchen gabapentin (NEURONTIN) 300 MG capsule Take 300 mg by mouth 3 (three) times daily.    Marland Kitchen lamoTRIgine (LAMICTAL) 200 MG tablet Take 200 mg by mouth at bedtime.    . magnesium 30 MG tablet Take 250 mg by mouth daily. (Patient not taking: Reported on 07/20/2020)    . Multiple Vitamins-Minerals (MULTIVITAMIN WITH MINERALS) tablet Take 1 tablet by mouth daily.    Marland Kitchen neomycin-polymyxin-dexameth (MAXITROL) 0.1 % OINT 1 application.     . polyethylene glycol (MIRALAX) 17 g packet Take 17 g by mouth daily. 14 each 0  . pravastatin (PRAVACHOL) 40 MG tablet Take 40 mg by mouth at bedtime.    . propranolol ER (INDERAL LA) 60 MG 24 hr capsule Take 60 mg by mouth daily.    . QUEtiapine (SEROQUEL) 25 MG tablet Take 0.5-1 tablets (12.5-25 mg total) by mouth daily as needed. For agitation and anxiety as needed 30 tablet 1  . QUEtiapine (SEROQUEL) 50 MG tablet Take 1 tablet (50 mg total) by mouth at bedtime. 30 tablet 1  . spironolactone (ALDACTONE) 100 MG tablet Take 100 mg by mouth at bedtime.    . vitamin B-12 (CYANOCOBALAMIN) 1000 MCG tablet Take 1,000 mcg by mouth daily. (Patient not taking: Reported on 04/28/2020)    . vortioxetine HBr (TRINTELLIX) 20 MG TABS tablet Take 1 tablet (20 mg total) by mouth daily. 30 tablet 1   No current facility-administered medications for this visit.     Musculoskeletal: Strength & Muscle Tone: UTA Gait & Station: UTA Patient leans: N/A  Psychiatric Specialty Exam: Review of Systems   Psychiatric/Behavioral: Positive for dysphoric mood. Negative for agitation, behavioral problems, confusion, decreased concentration, hallucinations, self-injury, sleep disturbance and suicidal ideas. The patient is nervous/anxious. The patient is not hyperactive.   All other systems reviewed and are negative.   There were no vitals taken for  this visit.There is no height or weight on file to calculate BMI.  General Appearance: UTA  Eye Contact:  UTA  Speech:  Clear and Coherent  Volume:  Normal  Mood:  Anxious and Dysphoric improving  Affect:  UTA  Thought Process:  Goal Directed and Descriptions of Associations: Intact  Orientation:  Other:  Date, situation  Thought Content: Logical   Suicidal Thoughts:  No  Homicidal Thoughts:  No  Memory:  Immediate;   Fair Recent;   Poor Remote;   Poor  Judgement:  Fair  Insight:  Shallow  Psychomotor Activity:  UTA  Concentration:  Concentration: Fair and Attention Span: Fair  Recall:  [employer] of Knowledge: Fair  Language: Fair  Akathisia:  No  Handed:  Right  AIMS (if indicated): UTA  Assets:  Housing Social Support  ADL's:  Impaired  Cognition: Impaired,  Mild  Sleep:  Improving   Screenings: PHQ2-9     Office Visit from 04/13/2020 in [facility]  PHQ-2 Total Score 0       Assessment and Plan: Jodi Parrish is a 74 year old Caucasian female, married, has a history of bipolar disorder type II, anxiety disorder, multiple medical problems including chronic pain, major neurocognitive disorder, panic disorder was evaluated by telemedicine today.  Patient is biologically predisposed given her history of medical problems including chronic pain, history of mental health problems in her family.  Patient with psychosocial stressors of the current pandemic, recent diagnosis of dementia, is currently making progress.  Plan as noted below.  Plan Major neurocognitive disorder likely due to  Alzheimer's-unstable Neuropsychological testing done by Dr. Bertram Denver 06/26/2020. Patient with significant area of low performance on measures that are known to be diagnostic of Alzheimer's clinical syndrome.  However further investigation is needed before coming to a definitive diagnosis. Patient to continue to follow-up with neurology-Dr.Potter Continue Trintellix 20 mg p.o. daily.  She reports she has been noncompliant since the past 1 week.  Encouraged compliance. Lamictal 200 mg p.o. daily at bedtime Gabapentin 300 mg 3 times daily Seroquel 50 mg p.o. nightly for paranoia, sleep and mood.  Panic attacks-improving Trintellix as prescribed Seroquel 12.5 to 25 mg daily as needed for severe panic symptoms. Continue CBT  Insomnia-improving Seroquel 50 mg p.o. nightly  Alcohol use disorder in remission Patient quit drinking 3 years ago.  Collateral information was obtained from husband as noted above.  I reviewed notes per Dr. Hurshel Keys 08/17/2020.  Follow-up in clinic in 1 month or sooner if needed.  I have spent atleast 20 minutes non face to face  with patient today. More than 50 % of the time was spent for preparing to see the patient ( e.g., review of test, records ), obtaining and to review and separately obtained history , ordering medications and test ,psychoeducation and supportive psychotherapy and care coordination,as well as documenting clinical information in electronic health record. This note was generated in part or whole with voice recognition software. Voice recognition is usually quite accurate but there are transcription errors that can and very often do occur. I apologize for any typographical errors that were not detected and corrected.       Jomarie Longs, MD 08/20/2020, 11:01 AM

## 2020-08-28 ENCOUNTER — Other Ambulatory Visit: Payer: Self-pay

## 2020-08-28 ENCOUNTER — Ambulatory Visit
Admission: RE | Admit: 2020-08-28 | Discharge: 2020-08-28 | Disposition: A | Discharge status: Home / Self Care | Payer: Medicare Other | Source: Ambulatory Visit | Attending: Neurology | Admitting: Neurology

## 2020-08-28 DIAGNOSIS — R413 Other amnesia: Secondary | ICD-10-CM | POA: Insufficient documentation

## 2020-09-16 ENCOUNTER — Other Ambulatory Visit: Payer: Self-pay | Admitting: Psychiatry

## 2020-09-16 DIAGNOSIS — F0390 Unspecified dementia without behavioral disturbance: Secondary | ICD-10-CM

## 2020-09-16 DIAGNOSIS — Z8659 Personal history of other mental and behavioral disorders: Secondary | ICD-10-CM

## 2020-09-16 DIAGNOSIS — F410 Panic disorder [episodic paroxysmal anxiety] without agoraphobia: Secondary | ICD-10-CM

## 2020-09-18 ENCOUNTER — Other Ambulatory Visit: Payer: Self-pay | Admitting: Psychiatry

## 2020-09-18 DIAGNOSIS — F410 Panic disorder [episodic paroxysmal anxiety] without agoraphobia: Secondary | ICD-10-CM

## 2020-09-28 ENCOUNTER — Telehealth: Payer: Medicare Other | Admitting: Psychiatry

## 2020-10-13 ENCOUNTER — Telehealth: Payer: Self-pay

## 2020-10-13 DIAGNOSIS — Z8659 Personal history of other mental and behavioral disorders: Secondary | ICD-10-CM

## 2020-10-13 DIAGNOSIS — F410 Panic disorder [episodic paroxysmal anxiety] without agoraphobia: Secondary | ICD-10-CM

## 2020-10-13 DIAGNOSIS — F0390 Unspecified dementia without behavioral disturbance: Secondary | ICD-10-CM

## 2020-10-13 MED ORDER — VORTIOXETINE HBR 20 MG PO TABS: 20.0000 mg | ORAL_TABLET | Freq: Every day | ORAL | 1 refills | Status: DC

## 2020-10-13 NOTE — Telephone Encounter (Signed)
I have sent Trintellix to her pharmacy.  Per CMA Jessica-patient wants a virtual appointment in just a face-to-face appointment.  Unknown what she means by that.  We will have CMA contact patient and clarify with patient.

## 2020-10-13 NOTE — Telephone Encounter (Signed)
pt states she doesnt have any more of the trintellix that she droped pill bottle and  the went on the floor and under the  refrigerator.   Patient wants to do a virtual appt instead of a face to face appt.

## 2020-10-16 ENCOUNTER — Encounter: Payer: Self-pay | Admitting: Psychiatry

## 2020-10-16 ENCOUNTER — Other Ambulatory Visit: Payer: Self-pay

## 2020-10-16 ENCOUNTER — Telehealth (INDEPENDENT_AMBULATORY_CARE_PROVIDER_SITE_OTHER): Payer: Medicare Other | Admitting: Psychiatry

## 2020-10-16 DIAGNOSIS — F32A Depression, unspecified: Secondary | ICD-10-CM

## 2020-10-16 DIAGNOSIS — F410 Panic disorder [episodic paroxysmal anxiety] without agoraphobia: Secondary | ICD-10-CM | POA: Diagnosis not present

## 2020-10-16 DIAGNOSIS — F0390 Unspecified dementia without behavioral disturbance: Secondary | ICD-10-CM | POA: Diagnosis not present

## 2020-10-16 NOTE — Progress Notes (Signed)
Virtual Visit via Video Note  I connected with Jodi Parrish on 10/16/20 at  9:30 AM EST by a video enabled telemedicine application and verified that I am speaking with the correct person using two identifiers.  Location Provider Location : [facility] Patient Location : Home  Participants: Patient , Spouse, Provider   I discussed the limitations of evaluation and management by telemedicine and the availability of in person appointments. The patient expressed understanding and agreed to proceed.   I discussed the assessment and treatment plan with the patient. The patient was provided an opportunity to ask questions and all were answered. The patient agreed with the plan and demonstrated an understanding of the instructions.   The patient was advised to call back or seek an in-person evaluation if the symptoms worsen or if the condition fails to improve as anticipated.   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  Patient today reports mood wise she is doing better.  She does continue to have anxiety when she has difficulty with her memory however she has been coping better than before.  She denies sadness.  She reports she feels more motivated.  Her energy level has improved.  She reports sleep is good.  Patient reports she is compliant on her medications and denies side effects.  Patient was able to give the month, the year.  She was able to give her date of birth as well as  her address correctly.  Patient could not give the names of her medications however reports she is compliant on all of them.  Collateral information was obtained from husband who reports patient as improving.  She does struggle with memory problems on and off however it is stable at this time.  She did have MRI scan of her brain.  She has upcoming appointment with neurologist.  She does have some paranoia on and off however it is more manageable.  She is compliant on medications.  Patient denies any suicidality or homicidality.  Patient denies any hallucinations.  She did not seem to be preoccupied with any delusions.  She was not observed as being paranoid in session today.  Patient denies any other concerns today.     Visit Diagnosis:    ICD-10-CM   1. Major neurocognitive disorder (HCC)  F03.90   2. Panic attacks  F41.0   3. Depression, unspecified depression type  F32.A    R/O Bipolar disorder- has history of bipolar disorder    Past Psychiatric History: I have reviewed past psychiatric history from my progress note on 04/28/2020.  Past trials of Celexa, Prozac, Zoloft, Cymbalta, Abilify, Lamictal, Adderall, trazodone-multiple other medications.  Past Medical History:  Past Medical History:  Diagnosis Date  . Ankle fracture    right  . Anxiety   . Arthritis   . Bipolar disorder (HCC)   . Depression   . Diverticulitis   . Hyperlipidemia   . Hypertension   . Osteoarthritis   . Overactive bladder     Past Surgical History:  Procedure  Laterality Date  . ABDOMINAL HYSTERECTOMY  1998  . BARTHOLIN GLAND CYST EXCISION  1976  . COLONOSCOPY    . COLONOSCOPY WITH PROPOFOL N/A 04/10/2018   Procedure: COLONOSCOPY WITH PROPOFOL;  Surgeon: Toledo, Boykin Nearing, MD;  Location: [facility];  Service: Gastroenterology;  Laterality: N/A;  . ELBOW SURGERY Right 1995   crushed elbow with hardware implanted  . ESOPHAGOGASTRODUODENOSCOPY (EGD) WITH PROPOFOL N/A 04/10/2018   Procedure:  ESOPHAGOGASTRODUODENOSCOPY (EGD) WITH PROPOFOL;  Surgeon: Toledo, Boykin Nearing, MD;  Location: [facility];  Service: Gastroenterology;  Laterality: N/A;  . JOINT REPLACEMENT Left 2011   knee  . JOINT REPLACEMENT Right 2009   elbow  . KNEE SURGERY Left 2000  . PELVIC FLOOR REPAIR  2009   PELVIC FLOOR LIFT  . REPLACEMENT TOTAL KNEE Left 2016  . TONSILLECTOMY  1958  . TOTAL HIP ARTHROPLASTY Left 2008  . TOTAL HIP ARTHROPLASTY Right 01/10/2017   Procedure: TOTAL HIP ARTHROPLASTY ANTERIOR APPROACH;  Surgeon: Kennedy Bucker, MD;  Location: [facility];  Service: Orthopedics;  Laterality: Right;    Family Psychiatric History: I have reviewed family psychiatric history from my progress note on 04/28/2020  Family History:  Family History  Problem Relation Age of Onset  . Parkinson's disease Mother   . CAD Father   . Hypertension Father   . Anxiety disorder Daughter   . Depression Daughter   . Suicidality Daughter   . Alcohol abuse Daughter   . Anxiety disorder Daughter   . Anxiety disorder Son   . Breast cancer Neg Hx     Social History: Reviewed social history from my progress note on 04/28/2020 Social History   Socioeconomic History  . Marital status: Married    Spouse name: Not on file  . Number of children: Not on file  . Years of education: Not on file  . Highest education level: Not on file  Occupational History  . Not on file  Tobacco Use  . Smoking status: Former Smoker    Types: Cigarettes  . Smokeless tobacco: Never Used  Vaping Use  . Vaping Use: Never used  Substance and Sexual Activity  . Alcohol use: Not Currently    Comment: HEAVY USE IN THE PAST- QUIT 3 YEARS AGO  . Drug use: No  . Sexual activity: Yes  Other Topics Concern  . Not on file  Social History Narrative  . Not on file   Social Determinants of Health   Financial Resource Strain: Not on file  Food Insecurity: Not on file  Transportation Needs: Not on file  Physical Activity: Not on file   Stress: Not on file  Social Connections: Not on file    Allergies:  Allergies  Allergen Reactions  . Budesonide-Formoterol Fumarate     Other reaction(s): Unknown  . Abilify [Aripiprazole] Anxiety    SEVERE HYPERACTIVITY  . Duloxetine Anxiety    Metabolic Disorder Labs: No results found for: HGBA1C, MPG No results found for: PROLACTIN No results found for: CHOL, TRIG, HDL, CHOLHDL, VLDL, LDLCALC Lab Results  Component Value Date   TSH 0.95 09/18/2013    Therapeutic Level Labs: No results found for: LITHIUM No results found for: VALPROATE No components found for:  CBMZ  Current Medications: Current Outpatient Medications  Medication Sig Dispense Refill  . acetaminophen (TYLENOL) 650 MG CR tablet Take 650 mg by mouth every 8 (eight) hours as needed for pain.    Marland Kitchen amoxicillin (AMOXIL) 500 MG capsule Take 500 mg by mouth daily. PRIOR TO  DENTAL APPOINTMENT    . bisacodyl (DULCOLAX) 10 MG suppository Place 1 suppository (10 mg total) rectally as needed for moderate constipation. 12 suppository 0  . Cholecalciferol (VITAMIN D-3) 5000 units TABS Take 1 tablet by mouth daily.    Marland Kitchen dicyclomine (BENTYL) 10 MG capsule Take 1 capsule (10 mg total) by mouth 4 (four) times daily for 14 days. 56 capsule 0  . folic acid (FOLVITE) 800 MCG tablet Take 800 mcg by mouth daily.    Marland Kitchen gabapentin (NEURONTIN) 100 MG capsule Take 100 mg by mouth 3 (three) times daily.    Marland Kitchen gabapentin (NEURONTIN) 300 MG capsule Take 300 mg by mouth 3 (three) times daily.    Marland Kitchen lamoTRIgine (LAMICTAL) 200 MG tablet Take 200 mg by mouth at bedtime.    . magnesium 30 MG tablet Take 250 mg by mouth daily. (Patient not taking: Reported on 07/20/2020)    . Multiple Vitamins-Minerals (MULTIVITAMIN WITH MINERALS) tablet Take 1 tablet by mouth daily.    Marland Kitchen neomycin-polymyxin-dexameth (MAXITROL) 0.1 % OINT 1 application.     . polyethylene glycol (MIRALAX) 17 g packet Take 17 g by mouth daily. 14 each 0  . pravastatin  (PRAVACHOL) 40 MG tablet Take 40 mg by mouth at bedtime.    . propranolol ER (INDERAL LA) 60 MG 24 hr capsule Take 60 mg by mouth daily.    . QUEtiapine (SEROQUEL) 25 MG tablet TAKE 1/2 TO 1 TABLET(12.5 TO 25 MG) BY MOUTH DAILY AS NEEDED FOR AGITATION OR ANXIETY 30 tablet 1  . QUEtiapine (SEROQUEL) 50 MG tablet TAKE 1 TABLET(50 MG) BY MOUTH AT BEDTIME 30 tablet 1  . spironolactone (ALDACTONE) 100 MG tablet Take 100 mg by mouth at bedtime.    . vitamin B-12 (CYANOCOBALAMIN) 1000 MCG tablet Take 1,000 mcg by mouth daily. (Patient not taking: Reported on 04/28/2020)    . vortioxetine HBr (TRINTELLIX) 20 MG TABS tablet Take 1 tablet (20 mg total) by mouth daily. 30 tablet 1   No current facility-administered medications for this visit.     Musculoskeletal: Strength & Muscle Tone: UTA Gait & Station: normal Patient leans: N/A  Psychiatric Specialty Exam: Review of Systems  Psychiatric/Behavioral: The patient is nervous/anxious.   All other systems reviewed and are negative.   There were no vitals taken for this visit.There is no height or weight on file to calculate BMI.  General Appearance: Casual  Eye Contact:  Fair  Speech:  Normal Rate  Volume:  Normal  Mood:  Anxious  Affect:  Appropriate  Thought Process:  Goal Directed and Descriptions of Associations: Intact  Orientation:  Other:  self, situation, month  Thought Content: Logical   Suicidal Thoughts:  No  Homicidal Thoughts:  No  Memory:  Immediate;   Fair Recent;   limited Remote;   limited  Judgement:  Fair  Insight:  Fair  Psychomotor Activity:  Normal  Concentration:  Concentration: Fair and Attention Span: Fair  Recall:  Poor  Fund of Knowledge: Fair  Language: Fair  Akathisia:  No  Handed:  Right  AIMS (if indicated): UTA  Assets:  Communication Skills Desire for Improvement Housing Intimacy Social Support  ADL's:  Intact  Cognition: Impaired,  Mild to moderate  Sleep:  Fair   Screenings: PHQ2-9    Flowsheet Row Video Visit from 10/16/2020 in [facility] Visit from 04/13/2020 in [facility]  PHQ-2 Total Score 0 0  PHQ-9 Total Score 0 -  Assessment and Plan: Jodi Parrish is a 75 year old Caucasian female, married, has a history of bipolar disorder type II, anxiety disorder, multiple medical problems including chronic pain, major neurocognitive disorder, panic disorder was evaluated by telemedicine today.  Patient is biologically predisposed given her history of medical problems including chronic pain, history of mental health problems in her family.  Patient with psychosocial stressors of the current pandemic recent diagnosis of dementia however is currently making progress.  Plan as noted below.  Plan Major neurocognitive disorder-improving Patient completed neuropsychological testing by Dr. Bertram Denver 06/26/2020 Patient with significant area of low performance on measures that are not to be diagnostic of Alzheimer's clinical syndrome.  Patient will continue to follow-up with neurologist. Continue Trintellix 20 mg p.o. daily. Lamictal 200 mg p.o. daily at bedtime Gabapentin 300 mg 3 times daily Seroquel 50 mg p.o. nightly for paranoia, sleep and mood.  Panic attacks-improving Trintellix as prescribed Seroquel 12.5-25 mg p.o. daily as needed for severe panic symptoms Continue CBT as needed  Insomnia-improving Seroquel 50 mg p.o. nightly  Alcohol use disorder in remission Patient quit drinking 3 years ago  Collateral information was obtained from husband-as noted above.  Follow-up in clinic in 1-1/2 months or sooner if needed.  I have spent atleast 20 minutes  face to face by video with patient today. More than 50 % of the time was spent for preparing to see the patient ( e.g., review of test, records ), obtaining and reviewing information, ordering medications and test ,psychoeducation and  supportive psychotherapy and care coordination,as well as documenting clinical information in electronic health record. This note was generated in part or whole with voice recognition software. Voice recognition is usually quite accurate but there are transcription errors that can and very often do occur. I apologize for any typographical errors that were not detected and corrected.       Jomarie Longs, MD 10/16/2020, 12:55 PM

## 2020-11-12 ENCOUNTER — Other Ambulatory Visit: Payer: Self-pay | Admitting: Psychiatry

## 2020-11-12 DIAGNOSIS — F0390 Unspecified dementia without behavioral disturbance: Secondary | ICD-10-CM

## 2020-11-12 DIAGNOSIS — F410 Panic disorder [episodic paroxysmal anxiety] without agoraphobia: Secondary | ICD-10-CM

## 2020-11-12 DIAGNOSIS — Z8659 Personal history of other mental and behavioral disorders: Secondary | ICD-10-CM

## 2020-11-18 ENCOUNTER — Other Ambulatory Visit: Payer: Self-pay | Admitting: Psychiatry

## 2020-11-18 DIAGNOSIS — F410 Panic disorder [episodic paroxysmal anxiety] without agoraphobia: Secondary | ICD-10-CM

## 2020-11-19 ENCOUNTER — Other Ambulatory Visit: Payer: Self-pay

## 2020-11-19 ENCOUNTER — Ambulatory Visit (INDEPENDENT_AMBULATORY_CARE_PROVIDER_SITE_OTHER): Payer: Medicare Other | Admitting: Psychiatry

## 2020-11-19 ENCOUNTER — Encounter: Payer: Self-pay | Admitting: Psychiatry

## 2020-11-19 VITALS — BP 149/82 | HR 73 | Temp 97.3°F

## 2020-11-19 DIAGNOSIS — F0390 Unspecified dementia without behavioral disturbance: Secondary | ICD-10-CM

## 2020-11-19 DIAGNOSIS — F410 Panic disorder [episodic paroxysmal anxiety] without agoraphobia: Secondary | ICD-10-CM

## 2020-11-19 DIAGNOSIS — F1021 Alcohol dependence, in remission: Secondary | ICD-10-CM | POA: Diagnosis not present

## 2020-11-19 DIAGNOSIS — Z8659 Personal history of other mental and behavioral disorders: Secondary | ICD-10-CM

## 2020-11-19 MED ORDER — CLONAZEPAM 1 MG PO TABS: 0.5000 mg | ORAL_TABLET | ORAL | 0 refills | Status: DC

## 2020-11-19 NOTE — Progress Notes (Addendum)
BH MD OP Progress Note  11/19/2020 6:57 PM [name]  MRN:  [id number]  Chief Complaint:  Chief Complaint    Follow-up; Anxiety     HPI: Jodi KirschnerJane A De Parrish is a 75 year old Caucasian female, married, lives in [place], has a history of bipolar disorder type II, anxiety disorder, cognitive disorder, insomnia, alcohol use disorder in remission, hyperlipidemia, chronic pain, arthritis, hypertension, multiple surgeries including knee replacement, hip replacement, overactive bladder major neurocognitive disorder unspecified was evaluated in office today.  Patient as well as spouse participated in evaluation today.  Patient appeared to be anxious in session today.  Patient reports she was feeling okay until few days ago.  She however reports that the past few days she has been feeling anxious.  She reports she cannot really explain how she is feeling however something has changed.  Patient appeared to have some difficulty finding her words in session today.  Patient however appeared to be alert, oriented to person place time and situation.  She was able to tell the date correctly with the month and the year.  She had trouble doing subtraction as well as spelling the word 'world' backward.  Patient also had trouble with 3 word memory after 5 minutes.  Patient had trouble copying a figure correctly.  MMSE completed and she scored 19 out of 30 today in session.  Per collateral information from husband patient has episodes of anxiety on and off.  The past few days she has been noted as having some trouble with her energy level.  According to husband he is currently going through some health problems.  He recently had to go to the emergency department possibly had strokelike episode and is currently undergoing further work-up.  They are also planning to go somewhere and it is likely all this could be contributing to her current changes in her mood and increased anxiety.  Patient however denies any  suicidality, homicidality or perceptual disturbances.  Visit Diagnosis:    ICD-10-CM   1. Major neurocognitive disorder (HCC)  F03.90   2. Panic attacks  F41.0 clonazePAM (KLONOPIN) 1 MG tablet  3. History of bipolar disorder  Z86.59   4. Alcohol use disorder, severe, in sustained remission (HCC)  F10.21     Past Psychiatric History: I have reviewed past psychiatric history from my progress note on 04/28/2020.  Past trials of Celexa, Prozac, Zoloft, Cymbalta, Abilify, Lamictal, Adderall, trazodone-multiple other medications.  Past Medical History:  Past Medical History:  Diagnosis Date  . Ankle fracture    right  . Anxiety   . Arthritis   . Bipolar disorder (HCC)   . Depression   . Diverticulitis   . Hyperlipidemia   . Hypertension   . Osteoarthritis   . Overactive bladder     Past Surgical History:  Procedure Laterality Date  . ABDOMINAL HYSTERECTOMY  1998  . BARTHOLIN GLAND CYST EXCISION  1976  . COLONOSCOPY    . COLONOSCOPY WITH PROPOFOL N/A 04/10/2018   Procedure: COLONOSCOPY WITH PROPOFOL;  Surgeon: Toledo, Boykin Nearingeodoro K, MD;  Location: [facility];  Service: Gastroenterology;  Laterality: N/A;  . ELBOW SURGERY Right 1995   crushed elbow with hardware implanted  . ESOPHAGOGASTRODUODENOSCOPY (EGD) WITH PROPOFOL N/A 04/10/2018   Procedure: ESOPHAGOGASTRODUODENOSCOPY (EGD) WITH PROPOFOL;  Surgeon: Toledo, Boykin Nearingeodoro K, MD;  Location: [facility];  Service: Gastroenterology;  Laterality: N/A;  . JOINT REPLACEMENT Left 2011   knee  . JOINT REPLACEMENT Right 2009   elbow  . KNEE SURGERY  Left 2000  . PELVIC FLOOR REPAIR  2009   PELVIC FLOOR LIFT  . REPLACEMENT TOTAL KNEE Left 2016  . TONSILLECTOMY  1958  . TOTAL HIP ARTHROPLASTY Left 2008  . TOTAL HIP ARTHROPLASTY Right 01/10/2017   Procedure: TOTAL HIP ARTHROPLASTY ANTERIOR APPROACH;  Surgeon: Kennedy Bucker, MD;  Location: [facility];  Service: Orthopedics;  Laterality: Right;    Family Psychiatric History: I have reviewed  family psychiatric history from my progress note on 04/28/2020  Family History:  Family History  Problem Relation Age of Onset  . Parkinson's disease Mother   . CAD Father   . Hypertension Father   . Anxiety disorder Daughter   . Depression Daughter   . Suicidality Daughter   . Alcohol abuse Daughter   . Anxiety disorder Daughter   . Anxiety disorder Son   . Breast cancer Neg Hx     Social History: Reviewed social history from my progress note on 04/28/2020 Social History   Socioeconomic History  . Marital status: Married    Spouse name: Not on file  . Number of children: Not on file  . Years of education: Not on file  . Highest education level: Not on file  Occupational History  . Not on file  Tobacco Use  . Smoking status: Former Smoker    Types: Cigarettes  . Smokeless tobacco: Never Used  Vaping Use  . Vaping Use: Never used  Substance and Sexual Activity  . Alcohol use: Not Currently    Comment: HEAVY USE IN THE PAST- QUIT 3 YEARS AGO  . Drug use: No  . Sexual activity: Yes  Other Topics Concern  . Not on file  Social History Narrative  . Not on file   Social Determinants of Health   Financial Resource Strain: Not on file  Food Insecurity: Not on file  Transportation Needs: Not on file  Physical Activity: Not on file  Stress: Not on file  Social Connections: Not on file    Allergies:  Allergies  Allergen Reactions  . Budesonide-Formoterol Fumarate     Other reaction(s): Unknown  . Abilify [Aripiprazole] Anxiety    SEVERE HYPERACTIVITY  . Duloxetine Anxiety    Metabolic Disorder Labs: No results found for: HGBA1C, MPG No results found for: PROLACTIN No results found for: CHOL, TRIG, HDL, CHOLHDL, VLDL, LDLCALC Lab Results  Component Value Date   TSH 0.95 09/18/2013    Therapeutic Level Labs: No results found for: LITHIUM No results found for: VALPROATE No components found for:  CBMZ  Current Medications: Current Outpatient Medications   Medication Sig Dispense Refill  . clonazePAM (KLONOPIN) 1 MG tablet Take 0.5-1 tablets (0.5-1 mg total) by mouth as directed. Take 3-4 times a week only for severe panic attacks 23 tablet 0  . acetaminophen (TYLENOL) 650 MG CR tablet Take 650 mg by mouth every 8 (eight) hours as needed for pain.    Marland Kitchen amoxicillin (AMOXIL) 500 MG capsule Take 500 mg by mouth daily. PRIOR TO DENTAL APPOINTMENT    . bisacodyl (DULCOLAX) 10 MG suppository Place 1 suppository (10 mg total) rectally as needed for moderate constipation. 12 suppository 0  . Cholecalciferol (VITAMIN D-3) 5000 units TABS Take 1 tablet by mouth daily.    Marland Kitchen dicyclomine (BENTYL) 10 MG capsule Take 1 capsule (10 mg total) by mouth 4 (four) times daily for 14 days. 56 capsule 0  . folic acid (FOLVITE) 800 MCG tablet Take 800 mcg by mouth daily.    Marland Kitchen gabapentin (  NEURONTIN) 100 MG capsule Take 100 mg by mouth 3 (three) times daily.    Marland Kitchen gabapentin (NEURONTIN) 300 MG capsule Take 300 mg by mouth 3 (three) times daily.    Marland Kitchen lamoTRIgine (LAMICTAL) 200 MG tablet Take 200 mg by mouth at bedtime.    . magnesium 30 MG tablet Take 250 mg by mouth daily. (Patient not taking: Reported on 07/20/2020)    . Multiple Vitamins-Minerals (MULTIVITAMIN WITH MINERALS) tablet Take 1 tablet by mouth daily.    Marland Kitchen neomycin-polymyxin-dexameth (MAXITROL) 0.1 % OINT 1 application.     . polyethylene glycol (MIRALAX) 17 g packet Take 17 g by mouth daily. 14 each 0  . pravastatin (PRAVACHOL) 40 MG tablet Take 40 mg by mouth at bedtime.    . propranolol ER (INDERAL LA) 60 MG 24 hr capsule Take 60 mg by mouth daily.    . QUEtiapine (SEROQUEL) 25 MG tablet TAKE 1/2 TO 1 TABLET BY MOUTH DAILY AS NEEDED FOR AGITATION OR ANXIETY 30 tablet 1  . QUEtiapine (SEROQUEL) 50 MG tablet TAKE 1 TABLET(50 MG) BY MOUTH AT BEDTIME 30 tablet 1  . spironolactone (ALDACTONE) 100 MG tablet Take 100 mg by mouth at bedtime.    . vitamin B-12 (CYANOCOBALAMIN) 1000 MCG tablet Take 1,000 mcg by mouth  daily. (Patient not taking: Reported on 04/28/2020)    . vortioxetine HBr (TRINTELLIX) 20 MG TABS tablet Take 1 tablet (20 mg total) by mouth daily. 30 tablet 1   No current facility-administered medications for this visit.     Musculoskeletal: Strength & Muscle Tone: UTA Gait & Station: Normal , but slow Patient leans: N/A  Psychiatric Specialty Exam: Review of Systems  Psychiatric/Behavioral: Positive for decreased concentration and dysphoric mood. The patient is nervous/anxious.   All other systems reviewed and are negative.   Blood pressure (!) 149/82, pulse 73, temperature (!) 97.3 F (36.3 C), temperature source Temporal.There is no height or weight on file to calculate BMI.  General Appearance: Casual  Eye Contact:  Fair  Speech:  Slow some blocking, delayed at times  Volume:  Normal  Mood:  Anxious and Dysphoric  Affect:  Congruent  Thought Process:  Goal Directed and Descriptions of Associations: Intact  Orientation:  Full (Time, Place, and Person)  Thought Content: Logical   Suicidal Thoughts:  No  Homicidal Thoughts:  No  Memory:  Immediate;   Fair Recent;   Limited Remote;   Limited  Judgement:  Fair  Insight:  Fair  Psychomotor Activity:  Decreased  Concentration:  Concentration: Poor and Attention Span: Poor  Recall:  Poor  Fund of Knowledge: Fair  Language: Fair  Akathisia:  No  Handed:  Right  AIMS (if indicated): not done  Assets:  Communication Skills Desire for Improvement Housing Intimacy Transportation  ADL's:  Intact  Cognition: impaired unspecified  Sleep:  Fair   Screenings: PHQ2-9   Flowsheet Row Video Visit from 10/16/2020 in [facility] Office Visit from 04/13/2020 in [facility]  PHQ-2 Total Score 0 0  PHQ-9 Total Score 0 --       Assessment and Plan: EFFIE WAHLERT Parrish is a 75 year old Caucasian female, married, has a history of bipolar disorder type II, anxiety  disorder, multiple medical problems including chronic pain, major neurocognitive disorder, panic disorder was evaluated in office today.  Patient is biologically predisposed given her history of medical problems including chronic pain, history of mental health problems in her family.  Patient with  psychosocial stressors of current pandemic, recent memory changes as well as husband's health issues.  She will benefit from the following plan.  Plan Major neurocognitive disorder-unstable Patient had neuropsychological testing done by Dr. Roseanne Reno dated 06/26/2020-patient showed significant area of low performance on measures that  shows major neurocognitive disorder level of dysfunction , signs that are typically specific for Alzheimer's pathology.  CT scan also showed pronounced changes in the temporal and frontal area.  Her temporal volume loss more notable on the right than on the left. Patient today had MMSE repeated by writer in office-she scored 19 out of 30. I have reviewed most recent notes per Dr. Malvin Johns in October 19 2020 when she scored 24 out of 30.Per Dr.Potter MRI - 08/28/2020 - normal for age. Unknown if today's change in MMSE likely due to her anxiety. Advised patient to continue to follow-up with Dr. Clenton Pare. I will also route this notes to Dr. Malvin Johns for care coordination.  Panic attacks-unstable Continue Trintellix 20 mg p.o. daily Continue Seroquel as prescribed We will start Klonopin 0.5-1 mg 2-3 times a week only for severe panic attacks.  Provided education, advised to limit use.  History of bipolar disorder-patient with history of bipolar disorder continues to have mild depressive symptoms along with her anxiety as well as neurocognitive issues. We will continue Lamictal 200 mg p.o. daily at bedtime Gabapentin 300 mg p.o. 3 times daily Seroquel 50 mg p.o. nightly.  Alcohol use disorder in remission Patient quit drinking 3 years ago  Collateral information was  obtained from husband who was present for part of the session.According to husband he was giving her klonopin as needed ,however I did not prescribe her klonopin until today.  Patient to follow up with PMD for elevated BP.  Follow-up in clinic in 2 weeks or sooner if needed.  I have spent atleast 35 minutes face to face with patient today which includes the time spent for preparing to see the patient ( e.g., review of test, records ), obtaining and to review and separately obtained history , ordering medications and test ,psychoeducation and supportive psychotherapy and care coordination,as well as documenting clinical information in electronic health record,interpreting and communication of test result.  This note was generated in part or whole with voice recognition software. Voice recognition is usually quite accurate but there are transcription errors that can and very often do occur. I apologize for any typographical errors that were not detected and corrected.        Jomarie Longs, MD 11/20/2020, 8:41 AM

## 2020-11-23 ENCOUNTER — Ambulatory Visit (INDEPENDENT_AMBULATORY_CARE_PROVIDER_SITE_OTHER): Payer: Medicare Other | Admitting: Podiatry

## 2020-11-23 ENCOUNTER — Other Ambulatory Visit: Payer: Self-pay

## 2020-11-23 ENCOUNTER — Encounter: Payer: Self-pay | Admitting: Podiatry

## 2020-11-23 DIAGNOSIS — M2042 Other hammer toe(s) (acquired), left foot: Secondary | ICD-10-CM

## 2020-11-23 DIAGNOSIS — M2041 Other hammer toe(s) (acquired), right foot: Secondary | ICD-10-CM | POA: Diagnosis not present

## 2020-11-23 DIAGNOSIS — L603 Nail dystrophy: Secondary | ICD-10-CM

## 2020-11-23 NOTE — Progress Notes (Signed)
  Subjective:  Patient ID: Jodi Parrish, female    DOB: 06-04-1946,  MRN: [id number]  Chief Complaint  Patient presents with  . Nail Problem    Patient presents today for thick fungal nails and hammertoes bilat 2nd toes.  She said she clipped her nails this morning but they are still thick and bother her sometimes.  She also states that her hammertoes bother her when trying to find new shoes    75 y.o. female presents with the above complaint. History confirmed with patient.   Objective:  Physical Exam: warm, good capillary refill, no trophic changes or ulcerative lesions, normal DP and PT pulses and normal sensory exam.  Bilaterally she has semireducible hammertoes, the worst of which are the second toes.  Thickening of the nail plate and subungual callus present. Assessment:   1. Hammertoe of right foot   2. Hammertoe of left foot   3. Nail dystrophy      Plan:  Patient was evaluated and treated and all questions answered.   Discussed etiology and treatment options for both hammertoes and the dystrophic nails.  I debrided the nails of the thickness and length using a sharp nail nipper as well as a chisel blade to debride the callus.  Discussed with her if this is a persistent issue we could consider flexor tenotomy to alleviate the deformity.  I dispensed her silicone toe crest as well.  Return if symptoms worsen or fail to improve.

## 2020-11-30 ENCOUNTER — Ambulatory Visit: Payer: Medicare Other | Admitting: Licensed Clinical Social Worker

## 2020-11-30 ENCOUNTER — Other Ambulatory Visit: Payer: Self-pay

## 2020-12-04 ENCOUNTER — Telehealth (INDEPENDENT_AMBULATORY_CARE_PROVIDER_SITE_OTHER): Payer: Medicare Other | Admitting: Psychiatry

## 2020-12-04 ENCOUNTER — Other Ambulatory Visit: Payer: Self-pay

## 2020-12-04 ENCOUNTER — Encounter: Payer: Self-pay | Admitting: Psychiatry

## 2020-12-04 DIAGNOSIS — F339 Major depressive disorder, recurrent, unspecified: Secondary | ICD-10-CM

## 2020-12-04 DIAGNOSIS — F1021 Alcohol dependence, in remission: Secondary | ICD-10-CM

## 2020-12-04 DIAGNOSIS — F0390 Unspecified dementia without behavioral disturbance: Secondary | ICD-10-CM | POA: Diagnosis not present

## 2020-12-04 DIAGNOSIS — W19XXXA Unspecified fall, initial encounter: Secondary | ICD-10-CM | POA: Insufficient documentation

## 2020-12-04 DIAGNOSIS — F410 Panic disorder [episodic paroxysmal anxiety] without agoraphobia: Secondary | ICD-10-CM

## 2020-12-04 DIAGNOSIS — G479 Sleep disorder, unspecified: Secondary | ICD-10-CM

## 2020-12-04 NOTE — Progress Notes (Signed)
Virtual Visit via Video Note  I connected with Jodi Parrish on 12/04/20 at  9:30 AM EDT by a video enabled telemedicine application and verified that I am speaking with the correct person using two identifiers.  Location Provider Location : [facility] Patient Location : Home  Participants: Patient , Spouse,Provider    I discussed the limitations of evaluation and management by telemedicine and the availability of in person appointments. The patient expressed understanding and agreed to proceed.   I discussed the assessment and treatment plan with the patient. The patient was provided an opportunity to ask questions and all were answered. The patient agreed with the plan and demonstrated an understanding of the instructions.   The patient was advised to call back or seek an in-person evaluation if the symptoms worsen or if the condition fails to improve as anticipated.   BH MD OP Progress Note  12/04/2020 1:28 PM [name]  MRN:  [id number]  Chief Complaint:  Chief Complaint    Follow-up; Anxiety     HPI: Jodi Parrish is a 75 year old Caucasian female, married, lives in [place], has a history of bipolar disorder type II, anxiety disorder, cognitive disorder, insomnia, alcohol use disorder in remission, hyperlipidemia, chronic pain, arthritis, hypertension, multiple surgeries, major neurocognitive disorder unspecified was evaluated by telemedicine today.  Patient being a limited historian her spouse provided collateral information.  Patient in session today appeared to have difficulty with her memory.  She had trouble stating her full home address.  Patient also had trouble stating the date as well as the year correctly.  Patient denies feeling depressed however reports she has had couple of falls the past few days.  She reports she does not remember when these falls have been and feels very weak and lethargic after that.  She has no memory of things around that time.  She reports  she has been taking her medications correctly.  She has not been overusing any of her medications.  She reports even though clonazepam was prescribed last visit she has not been using it every day.  She is limiting use.  Patient denies abusing alcohol or any other drugs at this time.  Patient denies any suicidality, homicidality.  Patient did not appear to be preoccupied with any paranoia or delusions.  According to husband patient had a fall yesterday and day before yesterday.  The fall happened suddenly.  He had to literally drag her from the floor to the bed to help her out.  According to husband at that time she was talking out of her head and felt her children were in their home when they were not.  She seemed confused and she slept the rest of the day.  The last time she had a fall she was taken to the emergency department.  They did not do that this time.  According to husband she did not have anxiety symptoms or racing heart or anything at the time of the fall.  Per husband he is currently struggling with medical problems and has upcoming MRI as well as other testing scheduled.  He likely has a mass in his brain.  He has appointment scheduled this afternoon.  This is also a stressor.    Visit Diagnosis:    ICD-10-CM   1. Major neurocognitive disorder (HCC)  F03.90   2. Panic attacks  F41.0   3. Major depressive disorder, recurrent episode with mixed features (HCC)  F33.9    hx of  bipolar   4. Alcohol use disorder, severe, in sustained remission (HCC)  F10.21   5. Sleep disorder  G47.9    R/O Narcolepsy    Past Psychiatric History: I have reviewed past psychiatric from my progress note on 04/28/2020.  Past trials of Celexa, Prozac, Zoloft, Cymbalta, Abilify, Lamictal, Adderall, trazodone-multiple other medications  Past Medical History:  Past Medical History:  Diagnosis Date  . Ankle fracture    right  . Anxiety   . Arthritis   . Bipolar disorder (HCC)   . Depression   .  Diverticulitis   . Hyperlipidemia   . Hypertension   . Osteoarthritis   . Overactive bladder     Past Surgical History:  Procedure Laterality Date  . ABDOMINAL HYSTERECTOMY  1998  . BARTHOLIN GLAND CYST EXCISION  1976  . COLONOSCOPY    . COLONOSCOPY WITH PROPOFOL N/A 04/10/2018   Procedure: COLONOSCOPY WITH PROPOFOL;  Surgeon: Toledo, Boykin Nearing, MD;  Location: [facility];  Service: Gastroenterology;  Laterality: N/A;  . ELBOW SURGERY Right 1995   crushed elbow with hardware implanted  . ESOPHAGOGASTRODUODENOSCOPY (EGD) WITH PROPOFOL N/A 04/10/2018   Procedure: ESOPHAGOGASTRODUODENOSCOPY (EGD) WITH PROPOFOL;  Surgeon: Toledo, Boykin Nearing, MD;  Location: [facility];  Service: Gastroenterology;  Laterality: N/A;  . JOINT REPLACEMENT Left 2011   knee  . JOINT REPLACEMENT Right 2009   elbow  . KNEE SURGERY Left 2000  . PELVIC FLOOR REPAIR  2009   PELVIC FLOOR LIFT  . REPLACEMENT TOTAL KNEE Left 2016  . TONSILLECTOMY  1958  . TOTAL HIP ARTHROPLASTY Left 2008  . TOTAL HIP ARTHROPLASTY Right 01/10/2017   Procedure: TOTAL HIP ARTHROPLASTY ANTERIOR APPROACH;  Surgeon: Kennedy Bucker, MD;  Location: [facility] ORS;  Service: Orthopedics;  Laterality: Right;    Family Psychiatric History: I have reviewed family psychiatric history from my progress note on 04/28/2020  Family History:  Family History  Problem Relation Age of Onset  . Parkinson's disease Mother   . CAD Father   . Hypertension Father   . Anxiety disorder Daughter   . Depression Daughter   . Suicidality Daughter   . Alcohol abuse Daughter   . Anxiety disorder Daughter   . Anxiety disorder Son   . Breast cancer Neg Hx     Social History: I have reviewed social history from my progress note on 04/28/2020 Social History   Socioeconomic History  . Marital status: Married    Spouse name: Not on file  . Number of children: Not on file  . Years of education: Not on file  . Highest education level: Not on file  Occupational  History  . Not on file  Tobacco Use  . Smoking status: Former Smoker    Types: Cigarettes  . Smokeless tobacco: Never Used  Vaping Use  . Vaping Use: Never used  Substance and Sexual Activity  . Alcohol use: Not Currently    Comment: HEAVY USE IN THE PAST- QUIT 3 YEARS AGO  . Drug use: No  . Sexual activity: Yes  Other Topics Concern  . Not on file  Social History Narrative  . Not on file   Social Determinants of Health   Financial Resource Strain: Not on file  Food Insecurity: Not on file  Transportation Needs: Not on file  Physical Activity: Not on file  Stress: Not on file  Social Connections: Not on file    Allergies:  Allergies  Allergen Reactions  . Budesonide-Formoterol Fumarate     Other  reaction(s): Unknown  . Abilify [Aripiprazole] Anxiety    SEVERE HYPERACTIVITY  . Duloxetine Anxiety    Metabolic Disorder Labs: No results found for: HGBA1C, MPG No results found for: PROLACTIN No results found for: CHOL, TRIG, HDL, CHOLHDL, VLDL, LDLCALC Lab Results  Component Value Date   TSH 0.95 09/18/2013    Therapeutic Level Labs: No results found for: LITHIUM No results found for: VALPROATE No components found for:  CBMZ  Current Medications: Current Outpatient Medications  Medication Sig Dispense Refill  . gabapentin (NEURONTIN) 100 MG capsule Take 100 mg by mouth 3 (three) times daily.    Marland Kitchen lamoTRIgine (LAMICTAL) 200 MG tablet Take 200 mg by mouth 2 (two) times daily.    Marland Kitchen acetaminophen (TYLENOL) 650 MG CR tablet Take 650 mg by mouth every 8 (eight) hours as needed for pain.    Marland Kitchen amoxicillin (AMOXIL) 500 MG capsule Take 500 mg by mouth daily. PRIOR TO DENTAL APPOINTMENT    . bisacodyl (DULCOLAX) 10 MG suppository Place 1 suppository (10 mg total) rectally as needed for moderate constipation. 12 suppository 0  . Cholecalciferol (VITAMIN D-3) 5000 units TABS Take 1 tablet by mouth daily.    . clonazePAM (KLONOPIN) 1 MG tablet Take 0.5-1 tablets (0.5-1 mg  total) by mouth as directed. Take 3-4 times a week only for severe panic attacks 23 tablet 0  . dicyclomine (BENTYL) 10 MG capsule Take 1 capsule (10 mg total) by mouth 4 (four) times daily for 14 days. 56 capsule 0  . folic acid (FOLVITE) 800 MCG tablet Take 800 mcg by mouth daily.    . magnesium 30 MG tablet Take 250 mg by mouth daily. (Patient not taking: Reported on 07/20/2020)    . Multiple Vitamins-Minerals (MULTIVITAMIN WITH MINERALS) tablet Take 1 tablet by mouth daily.    Marland Kitchen neomycin-polymyxin-dexameth (MAXITROL) 0.1 % OINT 1 application.     . polyethylene glycol (MIRALAX) 17 g packet Take 17 g by mouth daily. 14 each 0  . pravastatin (PRAVACHOL) 40 MG tablet Take 40 mg by mouth at bedtime.    . propranolol ER (INDERAL LA) 60 MG 24 hr capsule Take 60 mg by mouth daily.    . QUEtiapine (SEROQUEL) 25 MG tablet TAKE 1/2 TO 1 TABLET BY MOUTH DAILY AS NEEDED FOR AGITATION OR ANXIETY 30 tablet 1  . QUEtiapine (SEROQUEL) 50 MG tablet TAKE 1 TABLET(50 MG) BY MOUTH AT BEDTIME 30 tablet 1  . spironolactone (ALDACTONE) 100 MG tablet Take 100 mg by mouth at bedtime.    . vitamin B-12 (CYANOCOBALAMIN) 1000 MCG tablet Take 1,000 mcg by mouth daily. (Patient not taking: Reported on 04/28/2020)    . vortioxetine HBr (TRINTELLIX) 20 MG TABS tablet Take 1 tablet (20 mg total) by mouth daily. 30 tablet 1   No current facility-administered medications for this visit.     Musculoskeletal: Strength & Muscle Tone: UTA Gait & Station: UTA Patient leans: N/A  Psychiatric Specialty Exam: Review of Systems  Neurological:       Spells when she falls and is confused  Psychiatric/Behavioral: Negative for agitation, behavioral problems, confusion, decreased concentration, dysphoric mood, hallucinations, self-injury, sleep disturbance and suicidal ideas. The patient is not nervous/anxious and is not hyperactive.   All other systems reviewed and are negative.   There were no vitals taken for this visit.There is  no height or weight on file to calculate BMI.  General Appearance: Casual  Eye Contact:  Fair  Speech:  Clear and Coherent  Volume:  Normal  Mood:  Euthymic patient denies depression but is observed as flat , with psychomotor retardation  Affect:  Flat  Thought Process:  Goal Directed and Descriptions of Associations: Intact  Orientation:  Other:  self, situation  Thought Content: Logical   Suicidal Thoughts:  No  Homicidal Thoughts:  No  Memory:  Immediate;   Fair Recent;   Limited Remote;   Limited  Judgement:  Fair  Insight:  Shallow  Psychomotor Activity:  Decreased  Concentration:  Concentration: Fair and Attention Span: Fair  Recall:  [employer] of Knowledge: Fair  Language: Fair  Akathisia:  No  Handed:  Right  AIMS (if indicated): UTA  Assets:  Desire for Improvement Housing Intimacy Social Support  ADL's:  Intact  Cognition: Impaired,  Mild  Sleep:  Fair   Screenings: PHQ2-9   Flowsheet Row Video Visit from 10/16/2020 in [facility] Visit from 04/13/2020 in [facility]  PHQ-2 Total Score 0 0  PHQ-9 Total Score 0 --       Assessment and Plan: Trinia Georgi is a 75 year old Caucasian female, married, has a history of bipolar disorder type II, anxiety disorder, multiple medical problems including chronic pain, major neurocognitive disorder, panic disorder was evaluated by telemedicine today.  Patient is currently struggling with episodes of falls, will consider ruling out narcolepsy.  Discussed plan as noted below.  Plan Major neurocognitive disorder unspecified-unstable Patient had neuropsychological testing done by Dr. Roseanne Reno dated 06/26/2020. Patient currently follows up with Dr. Malvin Johns. Most recent MMSE dated 11/19/2020 done at our office-19 out of 30. In session today patient had significant memory trouble.  Panic attacks-improving Patient denies any significant anxiety  symptoms. Trintellix 20 mg p.o. daily Continue Klonopin 0.5-1 mg 2-3 times a week for severe panic attacks Patient advised to limit use. Continue Seroquel as prescribed  MDD with mixed features - unstable - History of bipolar disorder-patient with history of bipolar disorder with depressive symptoms as well as neurocognitive issues. She will continue Lamictal 200 mg daily at bedtime Gabapentin 100 mg 3 times a day. Seroquel as prescribed.  Alcohol use disorder in remission She has been sober since the past 3 years  Sleep disorder-recent falls-rule out narcolepsy Will refer patient for sleep study However advised patient as well as husband to contact neurologist as well as Dr. Dareen Piano primary care provider for further management and work-up. We will coordinate care.  Collateral information obtained from husband.  Follow-up in clinic in 3 to 4 weeks or sooner if needed.  This note was generated in part or whole with voice recognition software. Voice recognition is usually quite accurate but there are transcription errors that can and very often do occur. I apologize for any typographical errors that were not detected and corrected.      Jomarie Longs, MD 12/04/2020, 1:28 PM

## 2020-12-07 ENCOUNTER — Other Ambulatory Visit: Payer: Self-pay

## 2020-12-07 ENCOUNTER — Encounter: Payer: Self-pay | Admitting: Licensed Clinical Social Worker

## 2020-12-07 ENCOUNTER — Ambulatory Visit (INDEPENDENT_AMBULATORY_CARE_PROVIDER_SITE_OTHER): Payer: Medicare Other | Admitting: Licensed Clinical Social Worker

## 2020-12-07 DIAGNOSIS — F0390 Unspecified dementia without behavioral disturbance: Secondary | ICD-10-CM | POA: Diagnosis not present

## 2020-12-07 DIAGNOSIS — G479 Sleep disorder, unspecified: Secondary | ICD-10-CM | POA: Diagnosis not present

## 2020-12-07 DIAGNOSIS — F410 Panic disorder [episodic paroxysmal anxiety] without agoraphobia: Secondary | ICD-10-CM

## 2020-12-07 DIAGNOSIS — F339 Major depressive disorder, recurrent, unspecified: Secondary | ICD-10-CM

## 2020-12-07 NOTE — Progress Notes (Signed)
Virtual Visit via Video Note  I connected with Jodi Parrish on 12/07/20 at  2:00 PM EDT by a video enabled telemedicine application and verified that I am speaking with the correct person using two identifiers.  Participating Parties Patient Provider  Location: Patient: Home Provider: Home Office   I discussed the limitations of evaluation and management by telemedicine and the availability of in person appointments. The patient expressed understanding and agreed to proceed.   THERAPY PROGRESS NOTE  Session Time: 30 Minutes  Participation Level: Active  Behavioral Response: CasualAlertAnxious  Type of Therapy: Individual Therapy  Treatment Goals addressed: Anxiety and Coping  Interventions: CBT  Summary: Jodi Parrish is a 75 y.o. female who presents with anxiety and depression sxs. Pt is returning to therapy after several month gap. Pt has only met with this therapist once for CCA in August 2021 and canceled first follow up appointment in September 2021. Pt reported "I get anxious around new things. I was really tired and wanted to sleep. Sometimes I have a guilty feeling". Pt reported currently sleep is "very good". Pt recently diagnosed with Major Neurocognitive Disorder and has issues with memory, often forgetting what she was trying to say mid-sentence and asking therapist to repeat question multiple times. Pt reported her spouse continues to be supportive and sees her daughter and grandkids about once per month. Pt reported she does not get out of the house much, although when she does she likes to go for walks. Pt reported most of her coping is through watching TV. Pt acknowledged "I would like to read and draw more". Pt engaged in treatment planning and grounding exercise.  Suicidal/Homicidal: No  Therapist Response: Therapist met with patient for follow up. Therapist and patient reviewed sxs and attempts to cope. Therapist and patient created specific objectives and  discussed treatment plan to address sxs. Therapist led patient in self-soothing with the 5 senses technique. Pt was receptive. Therapist informed patient regarding initial phase of terminating services with this therapist due to leaving the practice in the next 5 weeks. Pt denied any concerns around this and had no questions at this time.   Plan: Return again in 1 week.  Diagnosis: Axis I: Major Neurocogntive D/O, Panic Attacks, MDD, Recurrent, Mixed, Sleep D/O     Axis II: N/A  Josephine Igo, LCSW, LCAS 12/07/2020

## 2020-12-08 ENCOUNTER — Other Ambulatory Visit: Payer: Self-pay | Admitting: Psychiatry

## 2020-12-08 DIAGNOSIS — F410 Panic disorder [episodic paroxysmal anxiety] without agoraphobia: Secondary | ICD-10-CM

## 2020-12-08 DIAGNOSIS — Z8659 Personal history of other mental and behavioral disorders: Secondary | ICD-10-CM

## 2020-12-08 DIAGNOSIS — F0390 Unspecified dementia without behavioral disturbance: Secondary | ICD-10-CM

## 2020-12-14 ENCOUNTER — Encounter: Payer: Self-pay | Admitting: Licensed Clinical Social Worker

## 2020-12-14 ENCOUNTER — Other Ambulatory Visit: Payer: Self-pay

## 2020-12-14 ENCOUNTER — Ambulatory Visit (INDEPENDENT_AMBULATORY_CARE_PROVIDER_SITE_OTHER): Payer: Medicare Other | Admitting: Licensed Clinical Social Worker

## 2020-12-14 DIAGNOSIS — F339 Major depressive disorder, recurrent, unspecified: Secondary | ICD-10-CM | POA: Diagnosis not present

## 2020-12-14 DIAGNOSIS — G479 Sleep disorder, unspecified: Secondary | ICD-10-CM | POA: Diagnosis not present

## 2020-12-14 DIAGNOSIS — F410 Panic disorder [episodic paroxysmal anxiety] without agoraphobia: Secondary | ICD-10-CM | POA: Diagnosis not present

## 2020-12-14 DIAGNOSIS — F0390 Unspecified dementia without behavioral disturbance: Secondary | ICD-10-CM

## 2020-12-14 NOTE — Progress Notes (Signed)
Virtual Visit via Telephone Note  I connected with TANECIA MCCAY Ryke on 12/14/20 at 11:00 AM EDT by telephone and verified that I am speaking with the correct person using two identifiers.  Participating Parties Patient Provider  Location: Patient: Home Provider: Home Office   I initially connected with patient via live video session, however due to audio issues, session was moved to over the phone. I discussed the limitations, risks, security and privacy concerns of performing an evaluation and management service by telephone and the availability of in person appointments. I also discussed with the patient that there may be a patient responsible charge related to this service. The patient expressed understanding and agreed to proceed.  THERAPY PROGRESS NOTE  Session Time: 17 Minutes  Participation Level: Active  Behavioral Response: CasualAlertAnxious and Depressed  Type of Therapy: Individual Therapy  Treatment Goals addressed: Coping  Interventions: CBT  Summary: Leatrice Parilla is a 75 y.o. female who presents with anxiety and depression sxs. Pt reported "it was not a good week. Sometimes I forget things, like to take my medications" and c/o increase in falls, leading to continued worrying about falling again. Pt visited her doctor around her most recent fall and is not sure how it happened. Pt denied problems with getting in and out of tub, uses walker, and identified no safety/tripping hazards in her home. Pt reported her main issue around medication is keeping track of so many and trying to find a regimen that works for her. Pt reported "my husband helps me with scheduling" when to take her medication and how much. Pt described taking the following number of medications: 7 in the morning, 5 in afternoon, and 5 at nighttime". Pt voiced confusion who to discuss medication concerns with asking therapist to confirm multiple times which of her doctors to report her concerns. Pt reported she  also had difficulty remembering what was discussed around grounding skills last session and "forgetting" what or how to implement skill into her routine. Pt reported despite this she was able to identify "feeling really good for a few days after last session" and "had a lot of fun yesterday" with her spouse. Pt reported she is sleeping well. Pt struggled for some time to schedule next session, often repeating that 12/24/20 was a day she already had several appointments scheduled and demonstrated difficulty making a decision about when to meet again.  Suicidal/Homicidal: No  Therapist Response: Therapist met with patient for follow up. Therapist and patient reviewed purpose and intent of grounding skills from last session. Therapist and patient discussed medication concerns and recent falls. Therapist encouraged patient to be open and honest with prescribers about side effects and compliance issues as it was unclear if she is taking all current medications as prescribed. Pt was receptive.  Plan: Return again in 2 weeks.  Diagnosis: Axis I: Major Neurocogntive D/O, Panic Attacks, MDD, Recurrent, Mixed, Sleep D/O     Axis II: N/A  Josephine Igo, LCSW, LCAS 12/14/2020

## 2020-12-24 ENCOUNTER — Encounter: Payer: Self-pay | Admitting: Psychiatry

## 2020-12-24 ENCOUNTER — Other Ambulatory Visit: Payer: Self-pay

## 2020-12-24 ENCOUNTER — Telehealth (INDEPENDENT_AMBULATORY_CARE_PROVIDER_SITE_OTHER): Payer: Medicare Other | Admitting: Psychiatry

## 2020-12-24 DIAGNOSIS — Z8659 Personal history of other mental and behavioral disorders: Secondary | ICD-10-CM

## 2020-12-24 DIAGNOSIS — G479 Sleep disorder, unspecified: Secondary | ICD-10-CM | POA: Insufficient documentation

## 2020-12-24 DIAGNOSIS — F1021 Alcohol dependence, in remission: Secondary | ICD-10-CM

## 2020-12-24 DIAGNOSIS — F410 Panic disorder [episodic paroxysmal anxiety] without agoraphobia: Secondary | ICD-10-CM | POA: Diagnosis not present

## 2020-12-24 DIAGNOSIS — F0390 Unspecified dementia without behavioral disturbance: Secondary | ICD-10-CM | POA: Diagnosis not present

## 2020-12-24 DIAGNOSIS — F339 Major depressive disorder, recurrent, unspecified: Secondary | ICD-10-CM

## 2020-12-24 MED ORDER — QUETIAPINE FUMARATE 50 MG PO TABS: ORAL_TABLET | ORAL | 1 refills | Status: DC

## 2020-12-24 MED ORDER — CLONAZEPAM 1 MG PO TABS: 0.5000 mg | ORAL_TABLET | ORAL | 0 refills | Status: DC

## 2020-12-24 MED ORDER — QUETIAPINE FUMARATE 25 MG PO TABS: ORAL_TABLET | ORAL | 1 refills | Status: DC

## 2020-12-24 NOTE — Progress Notes (Signed)
Virtual Visit via Video Note  I connected with Jodi Parrish on 12/24/20 at  9:20 AM EDT by a video enabled telemedicine application and verified that I am speaking with the correct person using two identifiers.  Location Provider Location : [facility] Patient Location : Home  Participants: Patient , Spouse,Provider   I discussed the limitations of evaluation and management by telemedicine and the availability of in person appointments. The patient expressed understanding and agreed to proceed.     I discussed the assessment and treatment plan with the patient. The patient was provided an opportunity to ask questions and all were answered. The patient agreed with the plan and demonstrated an understanding of the instructions.   The patient was advised to call back or seek an in-person evaluation if the symptoms worsen or if the condition fails to improve as anticipated.   [place] MD OP Progress Note  12/24/2020 3:28 PM Jodi Parrish  MRN:  [id number]  Chief Complaint:  Chief Complaint    Follow-up; Depression; Anxiety     HPI: Jodi Parrish is a 75 year old Caucasian female, married, lives in [place], has a history of bipolar disorder type II, anxiety disorder, cognitive disorder, insomnia, alcohol use disorder in remission, hyperlipidemia, chronic pain, arthritis, hypertension, multiple surgeries, major neurocognitive disorder was evaluated by telemedicine today.  Collateral information obtained from husband.  Patient today reports she feels much better with regards to her anxiety.  Patient continues to have episodes of confusion when she cannot remember certain things.  She however appeared to be pleasant, cheerful and not anxious in session today.  She was able to give her date of birth however could not give her address.  She was able to give her phone number correctly.  She was able to give the correct date.  Patient reports she met with her therapist and reports therapy sessions are  really helpful.  She has not had any anxiety attacks since her last visit with Probation officer.  Patient is compliant on medications.  She reports sleep as good.  She reports appetite is fair.  According to husband patient is currently doing well and has not had any spells of anxiety attacks.  Patient reports she has not received a call for sleep study yet.  Patient denies any other concerns today.  Visit Diagnosis:    ICD-10-CM   1. Major neurocognitive disorder ([place])  F03.90   2. Panic attacks  F41.0 QUEtiapine (SEROQUEL) 50 MG tablet    QUEtiapine (SEROQUEL) 25 MG tablet    clonazePAM (KLONOPIN) 1 MG tablet  3. Major depressive disorder, recurrent episode with mixed features ([place])  F33.9   4. Alcohol use disorder, severe, in sustained remission ([place])  F10.21   5. Sleep disorder  G47.9    R/O Narcolepsy  6. History of bipolar disorder  Z86.59 QUEtiapine (SEROQUEL) 50 MG tablet    Past Psychiatric History: I have reviewed past psychiatric history from my progress note on 04/28/2020.  Past trials of Celexa, Prozac, Zoloft, Cymbalta, Abilify, Lamictal, Adderall, trazodone and multiple other medications.  Past Medical History:  Past Medical History:  Diagnosis Date  . Ankle fracture    right  . Anxiety   . Arthritis   . Bipolar disorder ([place])   . Depression   . Diverticulitis   . Hyperlipidemia   . Hypertension   . Osteoarthritis   . Overactive bladder     Past Surgical History:  Procedure Laterality Date  . ABDOMINAL HYSTERECTOMY  [place]  . COLONOSCOPY    . COLONOSCOPY WITH PROPOFOL N/A 04/10/2018   Procedure: COLONOSCOPY WITH PROPOFOL;  Surgeon: Toledo, Benay Pike, MD;  Location: [facility] ENDOSCOPY;  Service: Gastroenterology;  Laterality: N/A;  . ELBOW SURGERY Right 1995   crushed elbow with hardware implanted  . ESOPHAGOGASTRODUODENOSCOPY (EGD) WITH PROPOFOL N/A 04/10/2018   Procedure: ESOPHAGOGASTRODUODENOSCOPY (EGD) WITH PROPOFOL;   Surgeon: Toledo, Benay Pike, MD;  Location: [facility] ENDOSCOPY;  Service: Gastroenterology;  Laterality: N/A;  . JOINT REPLACEMENT Left 2011   knee  . JOINT REPLACEMENT Right 2009   elbow  . KNEE SURGERY Left 2000  . PELVIC FLOOR REPAIR  2009   PELVIC FLOOR LIFT  . REPLACEMENT TOTAL KNEE Left 2016  . TONSILLECTOMY  1958  . TOTAL HIP ARTHROPLASTY Left 2008  . TOTAL HIP ARTHROPLASTY Right 01/10/2017   Procedure: TOTAL HIP ARTHROPLASTY ANTERIOR APPROACH;  Surgeon: Hessie Knows, MD;  Location: [facility] ORS;  Service: Orthopedics;  Laterality: Right;    Family Psychiatric History: I have reviewed family psychiatric history from my progress note on 04/28/2020  Family History:  Family History  Problem Relation Age of Onset  . Parkinson's disease Mother   . CAD Father   . Hypertension Father   . Anxiety disorder Daughter   . Depression Daughter   . Suicidality Daughter   . Alcohol abuse Daughter   . Anxiety disorder Daughter   . Anxiety disorder Son   . Breast cancer Neg Hx     Social History: Reviewed social history from my progress note on 04/28/2020 Social History   Socioeconomic History  . Marital status: Married    Spouse name: Not on file  . Number of children: Not on file  . Years of education: Not on file  . Highest education level: Not on file  Occupational History  . Not on file  Tobacco Use  . Smoking status: Former Smoker    Types: Cigarettes  . Smokeless tobacco: Never Used  Vaping Use  . Vaping Use: Never used  Substance and Sexual Activity  . Alcohol use: Not Currently    Comment: HEAVY USE IN THE PAST- QUIT 3 YEARS AGO  . Drug use: No  . Sexual activity: Yes  Other Topics Concern  . Not on file  Social History Narrative  . Not on file   Social Determinants of Health   Financial Resource Strain: Not on file  Food Insecurity: Not on file  Transportation Needs: Not on file  Physical Activity: Not on file  Stress: Not on file  Social Connections: Not on file     Allergies:  Allergies  Allergen Reactions  . Budesonide-Formoterol Fumarate     Other reaction(s): Unknown  . Abilify [Aripiprazole] Anxiety    SEVERE HYPERACTIVITY  . Duloxetine Anxiety    Metabolic Disorder Labs: No results found for: HGBA1C, MPG No results found for: PROLACTIN No results found for: CHOL, TRIG, HDL, CHOLHDL, VLDL, LDLCALC Lab Results  Component Value Date   TSH 0.95 09/18/2013    Therapeutic Level Labs: No results found for: LITHIUM No results found for: VALPROATE No components found for:  CBMZ  Current Medications: Current Outpatient Medications  Medication Sig Dispense Refill  . Baclofen 5 MG TABS Take by mouth.    Marland Kitchen acetaminophen (TYLENOL) 650 MG CR tablet Take 650 mg by mouth every 8 (eight) hours as needed for pain.    Marland Kitchen amoxicillin (AMOXIL) 500 MG capsule Take 500 mg by  mouth daily. PRIOR TO DENTAL APPOINTMENT    . Baclofen 5 MG TABS Take 1 tablet by mouth 3 (three) times daily.    . bisacodyl (DULCOLAX) 10 MG suppository Place 1 suppository (10 mg total) rectally as needed for moderate constipation. 12 suppository 0  . Cholecalciferol (VITAMIN D-3) 5000 units TABS Take 1 tablet by mouth daily.    . clonazePAM (KLONOPIN) 1 MG tablet Take 0.5-1 tablets (0.5-1 mg total) by mouth as directed. Take 3-4 times a week only for severe panic attacks 23 tablet 0  . dicyclomine (BENTYL) 10 MG capsule Take 1 capsule (10 mg total) by mouth 4 (four) times daily for 14 days. 56 capsule 0  . folic acid (FOLVITE) 725 MCG tablet Take 800 mcg by mouth daily.    Marland Kitchen gabapentin (NEURONTIN) 100 MG capsule Take 100 mg by mouth 3 (three) times daily.    Marland Kitchen lamoTRIgine (LAMICTAL) 200 MG tablet Take 200 mg by mouth 2 (two) times daily.    . magnesium 30 MG tablet Take 250 mg by mouth daily. (Patient not taking: Reported on 07/20/2020)    . Multiple Vitamins-Minerals (MULTIVITAMIN WITH MINERALS) tablet Take 1 tablet by mouth daily.    Marland Kitchen neomycin-polymyxin-dexameth (MAXITROL)  0.1 % OINT 1 application.     . polyethylene glycol (MIRALAX) 17 g packet Take 17 g by mouth daily. 14 each 0  . pravastatin (PRAVACHOL) 40 MG tablet Take 40 mg by mouth at bedtime.    . propranolol ER (INDERAL LA) 60 MG 24 hr capsule Take 60 mg by mouth daily.    . QUEtiapine (SEROQUEL) 25 MG tablet TAKE 1/2 TO 1 TABLET BY MOUTH DAILY AS NEEDED FOR AGITATION OR ANXIETY 30 tablet 1  . QUEtiapine (SEROQUEL) 50 MG tablet TAKE 1 TABLET(50 MG) BY MOUTH AT BEDTIME 30 tablet 1  . spironolactone (ALDACTONE) 100 MG tablet Take 100 mg by mouth at bedtime.    . TRINTELLIX 20 MG TABS tablet TAKE 1 TABLET(20 MG) BY MOUTH DAILY 30 tablet 1  . vitamin B-12 (CYANOCOBALAMIN) 1000 MCG tablet Take 1,000 mcg by mouth daily. (Patient not taking: Reported on 04/28/2020)     No current facility-administered medications for this visit.     Musculoskeletal: Strength & Muscle Tone: UTA Gait & Station: normal Patient leans: N/A  Psychiatric Specialty Exam: Review of Systems  Musculoskeletal: Positive for arthralgias.  Psychiatric/Behavioral: Negative for agitation, behavioral problems, confusion, decreased concentration, dysphoric mood, hallucinations, self-injury, sleep disturbance and suicidal ideas. The patient is not nervous/anxious and is not hyperactive.   All other systems reviewed and are negative.   There were no vitals taken for this visit.There is no height or weight on file to calculate BMI.  General Appearance: Casual  Eye Contact:  Fair  Speech:  Clear and Coherent  Volume:  Normal  Mood:  Euthymic  Affect:  Congruent  Thought Process:  Goal Directed and Descriptions of Associations: Intact  Orientation:  Full (Time, Place, and Person)  Thought Content: Logical   Suicidal Thoughts:  No  Homicidal Thoughts:  No  Memory:  Immediate;   Fair Recent;   Limited Remote;   Limited  Judgement:  Fair  Insight:  Fair  Psychomotor Activity:  Normal  Concentration:  Concentration: Fair and Attention  Span: Fair  Recall:  [employer] of Knowledge: Fair  Language: Fair  Akathisia:  No  Handed:  Right  AIMS (if indicated): UTA  Assets:  Communication Skills Desire for Improvement Housing Intimacy Social Support  ADL's:  Intact  Cognition: Impaired , mild   Sleep:  Fair   Screenings: PHQ2-9   Flowsheet Row Video Visit from 10/16/2020 in [facility] Office Visit from 04/13/2020 in [facility]  PHQ-2 Total Score 0 0  PHQ-9 Total Score 0 --    Flowsheet Row Counselor from 12/07/2020 in [facility] CATEGORY No Risk       Assessment and Plan: Jodi Parrish is a 75 year old Caucasian female, married, has a history of bipolar disorder type II, anxiety disorder, multiple medical problems including chronic pain, major neurocognitive disorder panic disorder was evaluated by telemedicine today.  Patient is currently making progress.  Plan as noted below.  Plan Major neurocognitive disorder unspecified-unstable Patient had neuropsychological testing done by Dr. Nicole Kindred dated 06/26/2020.  Patient continues to follow-up with neurologist Dr. Melrose Nakayama [facility] dated 11/19/2020-19 out of 30. Patient continues to have memory problems as noted above.  Panic attacks-improving Continue Trintellix 20 mg p.o. daily Klonopin 0.5-1 mg 2-3 times a week only for severe panic attacks Continue Seroquel as prescribed  MDD with mixed features-improving Lamictal 200 mg p.o. daily at bedtime Gabapentin 100 mg p.o. 3 times daily Seroquel 50 mg p.o. nightly She also has Seroquel 25 mg during the day as needed for severe anxiety attacks, patient to limit use  Alcohol use disorder in remission Patient has been sober since the past several years.  Sleep disorder-recent falls-rule out narcolepsy Patient has been referred to sleep study  Collateral information obtained from husband as noted  above.  Follow-up in clinic in person in 6 to 8 weeks.  This note was generated in part or whole with voice recognition software. Voice recognition is usually quite accurate but there are transcription errors that can and very often do occur. I apologize for any typographical errors that were not detected and corrected.         Ursula Alert, MD 12/24/2020, 3:28 PM

## 2020-12-30 ENCOUNTER — Ambulatory Visit (INDEPENDENT_AMBULATORY_CARE_PROVIDER_SITE_OTHER): Payer: Medicare Other | Admitting: Licensed Clinical Social Worker

## 2020-12-30 ENCOUNTER — Other Ambulatory Visit: Payer: Self-pay

## 2020-12-30 ENCOUNTER — Encounter: Payer: Self-pay | Admitting: Licensed Clinical Social Worker

## 2020-12-30 DIAGNOSIS — F339 Major depressive disorder, recurrent, unspecified: Secondary | ICD-10-CM

## 2020-12-30 DIAGNOSIS — F410 Panic disorder [episodic paroxysmal anxiety] without agoraphobia: Secondary | ICD-10-CM

## 2020-12-30 DIAGNOSIS — G479 Sleep disorder, unspecified: Secondary | ICD-10-CM

## 2020-12-30 DIAGNOSIS — F0390 Unspecified dementia without behavioral disturbance: Secondary | ICD-10-CM | POA: Diagnosis not present

## 2020-12-30 NOTE — Progress Notes (Signed)
Virtual Visit via Video Note  I connected with BLISS BEHNKE Ryke on 12/30/20 at  3:00 PM EDT by a video enabled telemedicine application and verified that I am speaking with the correct person using two identifiers.   Participating Parties Patient Spouse Provider  Location: Patient: Home Provider: Home Office   I discussed the limitations of evaluation and management by telemedicine and the availability of in person appointments. The patient expressed understanding and agreed to proceed.  THERAPY PROGRESS NOTE  Session Time: 15 Minutes  Participation Level: Minimal  Behavioral Response: CasualDrowsyAnxious  Type of Therapy: Individual Therapy  Treatment Goals addressed: Coping  Interventions: Supportive  Summary: Jodi Parrish is a 75 y.o. female who presents with anxiety and depression sxs. Pt reported feeling "very tired the last couple of days" despite sleeping well at night. Spouse in the background of session mentioned that "no one has called to schedule a sleep study to test for narcolepsy" and asked therapist to let psychiatrist know there has been no follow up since referral. Pt confirmed she is "doing a bit of exercise," but not getting out of the house much. Pt reported in addition to being tired, she often feels "anxious" and "I haven't followed through with or doing things I should do" however, she does continue to like to read. Pt denied experiencing any panic attacks since last session. Pt reported "I try to be positive about the information I have been getting" around her diagnoses. Pt often repeated herself in mentioning how tired she is feeling and appeared drowsy AEB drooping eyelids. Pt reported no other concerns at this time.   Suicidal/Homicidal: No  Therapist Response: Therapist met with patient for follow up. Therapist asked patient open-ended questions to elicit information about how she is feeling and current needs. Therapist and patient reviewed sxs and  attempts to cope. Therapist reminded patient this would be the last session due to therapist leaving practice in 2 weeks. Pt denied any questions or concerns around this. Therapist messaged psychiatrist and front staff regarding sleep study referral.  Plan: Return again as needed to resume therapy with new therapist.  Diagnosis: Axis I: Major Neurocogntive D/O, Panic Attacks, MDD, Recurrent, Mixed, Sleep D/O     Axis II: N/A  Josephine Igo, LCSW, LCAS 12/30/2020

## 2021-01-30 ENCOUNTER — Other Ambulatory Visit: Payer: Self-pay | Admitting: Psychiatry

## 2021-01-30 DIAGNOSIS — F410 Panic disorder [episodic paroxysmal anxiety] without agoraphobia: Secondary | ICD-10-CM

## 2021-01-30 DIAGNOSIS — Z8659 Personal history of other mental and behavioral disorders: Secondary | ICD-10-CM

## 2021-01-30 DIAGNOSIS — F0390 Unspecified dementia without behavioral disturbance: Secondary | ICD-10-CM

## 2021-02-02 DIAGNOSIS — R7303 Prediabetes: Secondary | ICD-10-CM | POA: Insufficient documentation

## 2021-02-09 ENCOUNTER — Ambulatory Visit: Payer: Medicare Other | Attending: Neurology

## 2021-02-09 DIAGNOSIS — G4733 Obstructive sleep apnea (adult) (pediatric): Secondary | ICD-10-CM | POA: Diagnosis present

## 2021-02-09 DIAGNOSIS — F5101 Primary insomnia: Secondary | ICD-10-CM | POA: Diagnosis not present

## 2021-02-10 ENCOUNTER — Other Ambulatory Visit: Payer: Self-pay

## 2021-02-10 ENCOUNTER — Ambulatory Visit: Payer: Medicare Other | Attending: Neurology

## 2021-02-10 DIAGNOSIS — G4733 Obstructive sleep apnea (adult) (pediatric): Secondary | ICD-10-CM | POA: Insufficient documentation

## 2021-02-10 DIAGNOSIS — G479 Sleep disorder, unspecified: Secondary | ICD-10-CM | POA: Insufficient documentation

## 2021-02-16 ENCOUNTER — Encounter: Payer: Self-pay | Admitting: Psychiatry

## 2021-02-16 ENCOUNTER — Other Ambulatory Visit: Payer: Self-pay

## 2021-02-16 ENCOUNTER — Telehealth (INDEPENDENT_AMBULATORY_CARE_PROVIDER_SITE_OTHER): Payer: Medicare Other | Admitting: Psychiatry

## 2021-02-16 DIAGNOSIS — F339 Major depressive disorder, recurrent, unspecified: Secondary | ICD-10-CM

## 2021-02-16 DIAGNOSIS — G479 Sleep disorder, unspecified: Secondary | ICD-10-CM

## 2021-02-16 DIAGNOSIS — F410 Panic disorder [episodic paroxysmal anxiety] without agoraphobia: Secondary | ICD-10-CM

## 2021-02-16 DIAGNOSIS — Z8659 Personal history of other mental and behavioral disorders: Secondary | ICD-10-CM

## 2021-02-16 DIAGNOSIS — F1021 Alcohol dependence, in remission: Secondary | ICD-10-CM

## 2021-02-16 DIAGNOSIS — F0390 Unspecified dementia without behavioral disturbance: Secondary | ICD-10-CM | POA: Diagnosis not present

## 2021-02-16 MED ORDER — QUETIAPINE FUMARATE 100 MG PO TABS: 100.0000 mg | ORAL_TABLET | Freq: Every day | ORAL | 1 refills | Status: DC

## 2021-02-16 NOTE — Progress Notes (Signed)
Virtual Visit via Video Note  I connected with Jodi Parrish on 02/16/21 at  9:00 AM EDT by a video enabled telemedicine application and verified that I am speaking with the correct person using two identifiers.  Location Provider Location : [facility] Patient Location : Home  Participants: Patient , Provider, spouse   I discussed the limitations of evaluation and management by telemedicine and the availability of in person appointments. The patient expressed understanding and agreed to proceed.     I discussed the assessment and treatment plan with the patient. The patient was provided an opportunity to ask questions and all were answered. The patient agreed with the plan and demonstrated an understanding of the instructions.   The patient was advised to call back or seek an in-person evaluation if the symptoms worsen or if the condition fails to improve as anticipated.   BH MD OP Progress Note  02/16/2021 9:16 AM Haiden Clucas  MRN:  [id number]  Chief Complaint:  Chief Complaint    Follow-up; Anxiety     HPI: Jodi Parrish is a 75 year old Caucasian female, married, lives in [place], has a history of bipolar disorder type II, anxiety disorder, cognitive disorder, insomnia, alcohol use disorder in remission, hyperlipidemia, chronic pain, arthritis, hypertension, multiple surgeries, major neurocognitive disorder was evaluated by telemedicine today.  Collateral information obtained from husband  Patient reports that 3 weeks ago she had an episode at [employer] store.  She had gone out with her husband, they got separated and she was shopping by herself while he was taking care of something else at the same store.  Patient at that time got extremely confused and did not know what she was doing, left her shopping cart and started wandering off and started tasting things on the isle.  Patient reports she has no recollection of what she was doing.  Finally the husband was able to find her, she  was sitting at that time feeling extremely tired and confused with her head down.  Patient was brought back home and she took a nap and felt better after the nap.  According to husband patient does have episodes of confusion at home however he has never seen anything like this before that she was completely out of it.  Patient also has no recollection of what exactly happened.  She reports since then she has been feeling okay and does not feel confused.  She however does have anxiety.  It comes and goes.  She believes she is sleeping through the night.  She also takes naps during the day a couple of hours in the afternoon or so.  Patient reports she has been taking her medications as prescribed and does not take more than what is prescribed.  Patient denies any suicidality, homicidality or perceptual disturbances.  She did have an appointment with primary care provider.  According to them they were advised to taper off the Lamictal however I do not see any documentation from her primary care provider regarding that.  This was discussed with patient as well as spouse.  Patient appeared to be alert, oriented to person, self, situation and date.  She however could not give her CD.  She was able to state her address otherwise correctly.  She had difficulty stating the name of her medications.  She appeared to have thought blocking during the episode and her speech was slow.  She needed a lot of assistance from her husband to give her history today.  Patient as well as spouse is interested in restarting psychotherapy sessions and hence discussed that I will refer her to a new therapist at the office.    Visit Diagnosis:    ICD-10-CM   1. Major neurocognitive disorder (HCC)  F03.90   2. Panic attacks  F41.0   3. Major depressive disorder, recurrent episode with mixed features (HCC)  F33.9   4. Alcohol use disorder, severe, in sustained remission (HCC)  F10.21   5. Sleep disorder  G47.9    R/O  Narcolepsy  6. History of bipolar disorder  Z86.59     Past Psychiatric History: I have reviewed past psychiatric history from progress note on 04/28/2020.  Past trials of Celexa, Prozac, Zoloft, Cymbalta, Abilify, Lamictal, Adderall, trazodone, multiple other medications.  Past Medical History:  Past Medical History:  Diagnosis Date  . Ankle fracture    right  . Anxiety   . Arthritis   . Bipolar disorder (HCC)   . Depression   . Diverticulitis   . Hyperlipidemia   . Hypertension   . Osteoarthritis   . Overactive bladder     Past Surgical History:  Procedure Laterality Date  . ABDOMINAL HYSTERECTOMY  1998  . BARTHOLIN GLAND CYST EXCISION  1976  . COLONOSCOPY    . COLONOSCOPY WITH PROPOFOL N/A 04/10/2018   Procedure: COLONOSCOPY WITH PROPOFOL;  Surgeon: Toledo, Boykin Nearing, MD;  Location: [facility];  Service: Gastroenterology;  Laterality: N/A;  . ELBOW SURGERY Right 1995   crushed elbow with hardware implanted  . ESOPHAGOGASTRODUODENOSCOPY (EGD) WITH PROPOFOL N/A 04/10/2018   Procedure: ESOPHAGOGASTRODUODENOSCOPY (EGD) WITH PROPOFOL;  Surgeon: Toledo, Boykin Nearing, MD;  Location: [facility] END[facility];  Service: Gastroenterology;  Laterality: N/A;  . JOINT REPLACEMENT Left 2011   knee  . JOINT REPLACEMENT Right 2009   elbow  . KNEE SURGERY Left 2000  . PELVIC FLOOR REPAIR  2009   PELVIC FLOOR LIFT  . REPLACEMENT TOTAL KNEE Left 2016  . TONSILLECTOMY  1958  . TOTAL HIP ARTHROPLASTY Left 2008  . TOTAL HIP ARTHROPLASTY Right 01/10/2017   Procedure: TOTAL HIP ARTHROPLASTY ANTERIOR APPROACH;  Surgeon: Kennedy Bucker, MD;  Location: [facility] ORS;  Service: Orthopedics;  Laterality: Right;    Family Psychiatric History: I have reviewed family psychiatric history from progress note on 04/28/2020  Family History:  Family History  Problem Relation Age of Onset  . Parkinson's disease Mother   . CAD Father   . Hypertension Father   . Anxiety disorder Daughter   . Depression Daughter   .  Suicidality Daughter   . Alcohol abuse Daughter   . Anxiety disorder Daughter   . Anxiety disorder Son   . Breast cancer Neg Hx     Social History: Reviewed social history from progress note on 04/28/2020 Social History   Socioeconomic History  . Marital status: Married    Spouse name: Not on file  . Number of children: Not on file  . Years of education: Not on file  . Highest education level: Not on file  Occupational History  . Not on file  Tobacco Use  . Smoking status: Former Smoker    Types: Cigarettes  . Smokeless tobacco: Never Used  Vaping Use  . Vaping Use: Never used  Substance and Sexual Activity  . Alcohol use: Not Currently    Comment: HEAVY USE IN THE PAST- QUIT 3 YEARS AGO  . Drug use: No  . Sexual activity: Yes  Other Topics Concern  . Not on file  Social History Narrative  . Not on file   Social Determinants of Health   Financial Resource Strain: Not on file  Food Insecurity: Not on file  Transportation Needs: Not on file  Physical Activity: Not on file  Stress: Not on file  Social Connections: Not on file    Allergies:  Allergies  Allergen Reactions  . Budesonide-Formoterol Fumarate     Other reaction(s): Unknown  . Abilify [Aripiprazole] Anxiety    SEVERE HYPERACTIVITY  . Duloxetine Anxiety    Metabolic Disorder Labs: No results found for: HGBA1C, MPG No results found for: PROLACTIN No results found for: CHOL, TRIG, HDL, CHOLHDL, VLDL, LDLCALC Lab Results  Component Value Date   TSH 0.95 09/18/2013    Therapeutic Level Labs: No results found for: LITHIUM No results found for: VALPROATE No components found for:  CBMZ  Current Medications: Current Outpatient Medications  Medication Sig Dispense Refill  . acetaminophen (TYLENOL) 650 MG CR tablet Take 650 mg by mouth every 8 (eight) hours as needed for pain.    Marland Kitchen amoxicillin (AMOXIL) 500 MG capsule Take 500 mg by mouth daily. PRIOR TO DENTAL APPOINTMENT    . Baclofen 5 MG TABS  Take by mouth.    . Baclofen 5 MG TABS Take 1 tablet by mouth 3 (three) times daily.    . bisacodyl (DULCOLAX) 10 MG suppository Place 1 suppository (10 mg total) rectally as needed for moderate constipation. 12 suppository 0  . Cholecalciferol (VITAMIN D-3) 5000 units TABS Take 1 tablet by mouth daily.    . clonazePAM (KLONOPIN) 1 MG tablet Take 0.5-1 tablets (0.5-1 mg total) by mouth as directed. Take 3-4 times a week only for severe panic attacks 23 tablet 0  . dicyclomine (BENTYL) 10 MG capsule Take 1 capsule (10 mg total) by mouth 4 (four) times daily for 14 days. 56 capsule 0  . folic acid (FOLVITE) 800 MCG tablet Take 800 mcg by mouth daily.    Marland Kitchen gabapentin (NEURONTIN) 100 MG capsule Take 100 mg by mouth 3 (three) times daily.    Marland Kitchen lamoTRIgine (LAMICTAL) 200 MG tablet Take 200 mg by mouth 2 (two) times daily.    . magnesium 30 MG tablet Take 250 mg by mouth daily. (Patient not taking: Reported on 07/20/2020)    . Multiple Vitamins-Minerals (MULTIVITAMIN WITH MINERALS) tablet Take 1 tablet by mouth daily.    Marland Kitchen neomycin-polymyxin-dexameth (MAXITROL) 0.1 % OINT 1 application.     . polyethylene glycol (MIRALAX) 17 g packet Take 17 g by mouth daily. 14 each 0  . pravastatin (PRAVACHOL) 40 MG tablet Take 40 mg by mouth at bedtime.    . propranolol ER (INDERAL LA) 60 MG 24 hr capsule Take 60 mg by mouth daily.    . QUEtiapine (SEROQUEL) 25 MG tablet TAKE 1/2 TO 1 TABLET BY MOUTH DAILY AS NEEDED FOR AGITATION OR ANXIETY 30 tablet 1  . QUEtiapine (SEROQUEL) 50 MG tablet TAKE 1 TABLET(50 MG) BY MOUTH AT BEDTIME 30 tablet 1  . spironolactone (ALDACTONE) 100 MG tablet Take 100 mg by mouth at bedtime.    . TRINTELLIX 20 MG TABS tablet TAKE 1 TABLET(20 MG) BY MOUTH DAILY 30 tablet 1  . vitamin B-12 (CYANOCOBALAMIN) 1000 MCG tablet Take 1,000 mcg by mouth daily. (Patient not taking: Reported on 04/28/2020)     No current facility-administered medications for this visit.     Musculoskeletal: Strength  & Muscle Tone: UTA Gait & Station: UTA Patient leans: N/A  Psychiatric Specialty Exam:  Review of Systems  Psychiatric/Behavioral: Positive for decreased concentration. The patient is nervous/anxious.   All other systems reviewed and are negative.   There were no vitals taken for this visit.There is no height or weight on file to calculate BMI.  General Appearance: Casual  Eye Contact:  Fair  Speech:  Slow  Volume:  Blocked  Mood:  Anxious  Affect:  Blunt  Thought Process:  Linear and Descriptions of Associations: Intact  Orientation:  Other:  person, date   Thought Content: Logical   Suicidal Thoughts:  No  Homicidal Thoughts:  No  Memory:  Immediate;   Fair Recent;   limited  Remote;   limited  Judgement:  Fair  Insight:  Shallow  Psychomotor Activity:  UTA  Concentration:  Concentration: Fair and Attention Span: Fair  Recall:  [employer]Fair  Fund of Knowledge: Fair  Language: Fair  Akathisia:  No  Handed:  Right  AIMS (if indicated): UTA  Assets:  Communication Skills Desire for Improvement Housing Social Support  ADL's:  Intact  Cognition: WNL  Sleep:  Fair   Screenings: PHQ2-9   Flowsheet Row Video [facility]16[facility]2020 [facility]  PHQ-2 Total Score 0 0  PHQ-9 Total Score 0 --    Flowsheet Row Counselor [facility]07[facility]  C-SSRS RISK CATEGORY No Risk       Assessment and Plan: [name] is a 75 year old Caucasian female, married, has a history of bipolar disorder type II, anxiety disorder, multiple medical problems including chronic pain, major neurocognitive disorder, panic disorder was evaluated by telemedicine today.  Patient is currently reporting episodes of confusion, wandering , had 1 episode 3 weeks ago, today appeared to be having thought blocking, cognitive slowing.  Patient will benefit from the  following plan.  Plan Major neurocognitive disorder unspecified-unstable Neuropsychological testing completed-06/26/2020. Currently with neurologist-Dr. Malvin JohnsPotter. MMSE-11/19/2020-completed at our office-19 out of 30. Patient advised to reach out to Dr. Carlton AdamPotter-neurologist about recent episode of confusion and wandering.  MDD with mixed features- improving Trintellix 20 mg p.o. daily Lamictal 200 mg p.o. daily at bedtime Gabapentin 100 mg p.o. 3 times daily Increase Seroquel to 100 mg p.o. nightly Seroquel 25 mg during the day as needed for severe panic attacks only.  Panic attacks-improving Trintellix 20 mg p.o. daily Klonopin 0.5-1 mg 3-4 times a week only for severe panic attacks. Continue Seroquel 25 mg p.o. daily as needed for severe anxiety attacks only.  Alcohol use disorder in remission Patient has been sober.  Sleep disorder- rule out narcolepsy Patient had sleep study completed-pending report  Collateral information obtained from husband- as noted above.  Patient advised to reach out to a neurologist for recent episode of confusion and wandering.  I have reviewed notes per Dr. Dareen PianoAnderson dated 02/02/2021- orders-CMP, lipid panel, hemoglobin A1c I have reviewed labs dated 01/26/2021-BMP, hemoglobin A1c, hepatic function panel, lipid panel-all within normal limits.  Hemoglobin A1c is slightly elevated at 5.8.  Follow-up in clinic in 2 weeks or sooner if needed.  This note was generated in part or whole with voice recognition software. Voice recognition is usually quite accurate but there are transcription errors that can and very often do occur. I apologize for any typographical errors that were not detected and corrected.      Jomarie LongsSaramma Thelda Gagan, MD 02/16/2021, 9:16 AM

## 2021-03-02 ENCOUNTER — Other Ambulatory Visit (HOSPITAL_COMMUNITY): Payer: Self-pay | Admitting: Physician Assistant

## 2021-03-02 ENCOUNTER — Other Ambulatory Visit: Payer: Self-pay | Admitting: Physician Assistant

## 2021-03-02 DIAGNOSIS — R55 Syncope and collapse: Secondary | ICD-10-CM

## 2021-03-02 DIAGNOSIS — R413 Other amnesia: Secondary | ICD-10-CM

## 2021-03-04 ENCOUNTER — Other Ambulatory Visit: Payer: Self-pay

## 2021-03-04 ENCOUNTER — Telehealth (INDEPENDENT_AMBULATORY_CARE_PROVIDER_SITE_OTHER): Payer: Medicare Other | Admitting: Psychiatry

## 2021-03-04 ENCOUNTER — Encounter: Payer: Self-pay | Admitting: Psychiatry

## 2021-03-04 DIAGNOSIS — Z8659 Personal history of other mental and behavioral disorders: Secondary | ICD-10-CM

## 2021-03-04 DIAGNOSIS — F0390 Unspecified dementia without behavioral disturbance: Secondary | ICD-10-CM

## 2021-03-04 DIAGNOSIS — F339 Major depressive disorder, recurrent, unspecified: Secondary | ICD-10-CM | POA: Diagnosis not present

## 2021-03-04 DIAGNOSIS — F410 Panic disorder [episodic paroxysmal anxiety] without agoraphobia: Secondary | ICD-10-CM

## 2021-03-04 DIAGNOSIS — G479 Sleep disorder, unspecified: Secondary | ICD-10-CM

## 2021-03-04 DIAGNOSIS — F1021 Alcohol dependence, in remission: Secondary | ICD-10-CM | POA: Diagnosis not present

## 2021-03-04 NOTE — Progress Notes (Signed)
Virtual Visit via Video Note  I connected with Jodi Parrish on 03/04/21 at 10:00 AM EDT by a video enabled telemedicine application and verified that I am speaking with the correct person using two identifiers.  Location Provider Location : [facility] Patient Location : Home  Participants: Patient ,Spouse, Provider   I discussed the limitations of evaluation and management by telemedicine and the availability of in person appointments. The patient expressed understanding and agreed to proceed.    I discussed the assessment and treatment plan with the patient. The patient was provided an opportunity to ask questions and all were answered. The patient agreed with the plan and demonstrated an understanding of the instructions.   The patient was advised to call back or seek an in-person evaluation if the symptoms worsen or if the condition fails to improve as anticipated.   BH MD OP Progress Note  03/04/2021 5:35 PM Jodi Parrish  MRN:  [id number]  Chief Complaint:  Chief Complaint   Follow-up; Depression    HPI: Jodi Parrish is a 75 year old Caucasian female, married, lives in [place], has a history of bipolar disorder type II, anxiety disorder, cognitive disorder, insomnia, alcohol use disorder in remission, hyperlipidemia, chronic pain, arthritis, hypertension, multiple surgeries, major neurocognitive disorder was evaluated by telemedicine today. Collateral information was obtained from spouse-Henry.  Patient today appeared to have thought blocking, memory problems.  She could not give her address properly at the beginning of the session.  And this made her extremely anxious to the point that patient became tearful stating that she is not feeling well.  Patient throughout the session continued to have severe thought blocking and could not answer questions appropriately, like the names of her medications, for what she was taking it for and things like that.  Patient kept stating that she  does not want to feel this way anymore throughout the session.  Patient had neurology visit recently and is currently scheduled for an EEG, MRI scan of her brain, carotid ultrasound.  Also there was a discussion of starting patient on Aricept 5 mg.  She however reports she has not started that medication yet.  Patient did complete sleep study, results is pending.  Patient was unable to give a description about her mood other than just stating she was not feeling good.  She however did report that the past few days she was feeling good and it is just today that she is this way.  According to husband patient continues to need a lot of help and this is frustrating for him too.  Patient does have a list available to her and she takes her medications based on that list.  According to husband however they are currently discussing probably going into an assisted living facility or a senior living community so that they can get some help.  Patient's daughter also has been discussing this with them.  However according to husband patient does not cooperate and does not want to go to such facilities.  Husband reports he is also not doing well medically and hence it is getting harder and harder for him to take care of her needs at home  Patient denies suicidality, homicidality or perceptual disturbances.      Visit Diagnosis:    ICD-10-CM   1. Major neurocognitive disorder (HCC)  F03.90     2. Panic attacks  F41.0     3. Major depressive disorder, recurrent episode with mixed features (HCC)  F33.9  4. Alcohol use disorder, severe, in sustained remission (HCC)  F10.21     5. Sleep disorder  G47.9    R/O Narcolepsy    6. History of bipolar disorder  Z86.59       Past Psychiatric History: I have reviewed past psychiatric history from my progress note on 04/28/2020.  Past trials of medications- Celexa Prozac Zoloft Cymbalta Abilify Lamictal Adderall Trazodone Multiple other  medications.   Past Medical History:  Past Medical History:  Diagnosis Date   Ankle fracture    right   Anxiety    Arthritis    Bipolar disorder (HCC)    Depression    Diverticulitis    Hyperlipidemia    Hypertension    Osteoarthritis    Overactive bladder     Past Surgical History:  Procedure Laterality Date   ABDOMINAL HYSTERECTOMY  1998   BARTHOLIN GLAND CYST EXCISION  1976   COLONOSCOPY     COLONOSCOPY WITH PROPOFOL N/A 04/10/2018   Procedure: COLONOSCOPY WITH PROPOFOL;  Surgeon: Toledo, Boykin Nearing, MD;  Location: [facility];  Service: Gastroenterology;  Laterality: N/A;   ELBOW SURGERY Right 1995   crushed elbow with hardware implanted   ESOPHAGOGASTRODUODENOSCOPY (EGD) WITH PROPOFOL N/A 04/10/2018   Procedure: ESOPHAGOGASTRODUODENOSCOPY (EGD) WITH PROPOFOL;  Surgeon: Toledo, Boykin Nearing, MD;  Location: [facility];  Service: Gastroenterology;  Laterality: N/A;   JOINT REPLACEMENT Left 2011   knee   JOINT REPLACEMENT Right 2009   elbow   KNEE SURGERY Left 2000   PELVIC FLOOR REPAIR  2009   PELVIC FLOOR LIFT   REPLACEMENT TOTAL KNEE Left 2016   TONSILLECTOMY  1958   TOTAL HIP ARTHROPLASTY Left 2008   TOTAL HIP ARTHROPLASTY Right 01/10/2017   Procedure: TOTAL HIP ARTHROPLASTY ANTERIOR APPROACH;  Surgeon: Kennedy Bucker, MD;  Location: [facility] OR[facility];  Service: Orthopedics;  Laterality: Right;    Family Psychiatric History: Reviewed family psychiatric history from progress note on 04/28/2020.  Family History:  Family History  Problem Relation Age of Onset   Parkinson's disease Mother    CAD Father    Hypertension Father    Anxiety disorder Daughter    Depression Daughter    Suicidality Daughter    Alcohol abuse Daughter    Anxiety disorder Daughter    Anxiety disorder Son    Breast cancer Neg Hx     Social History: Reviewed social history from progress note on 04/28/2020. Social History   Socioeconomic History   Marital status: Married    Spouse name: Not on  file   Number of children: Not on file   Years of education: Not on file   Highest education level: Not on file  Occupational History   Not on file  Tobacco Use   Smoking status: Former    Pack years: 0.00    Types: Cigarettes   Smokeless tobacco: Never  Vaping Use   Vaping Use: Never used  Substance and Sexual Activity   Alcohol use: Not Currently    Comment: HEAVY USE IN THE PAST- QUIT 3 YEARS AGO   Drug use: No   Sexual activity: Yes  Other Topics Concern   Not on file  Social History Narrative   Not on file   Social Determinants of Health   Financial Resource Strain: Not on file  Food Insecurity: Not on file  Transportation Needs: Not on file  Physical Activity: Not on file  Stress: Not on file  Social Connections: Not on file    Allergies:  Allergies  Allergen Reactions   Budesonide-Formoterol Fumarate     Other reaction(s): Unknown   Abilify [Aripiprazole] Anxiety    SEVERE HYPERACTIVITY   Duloxetine Anxiety    Metabolic Disorder Labs: No results found for: HGBA1C, MPG No results found for: PROLACTIN No results found for: CHOL, TRIG, HDL, CHOLHDL, VLDL, LDLCALC Lab Results  Component Value Date   TSH 0.95 09/18/2013    Therapeutic Level Labs: No results found for: LITHIUM No results found for: VALPROATE No components found for:  CBMZ  Current Medications: Current Outpatient Medications  Medication Sig Dispense Refill   traZODone (DESYREL) 100 MG tablet Take 1 tablet by mouth at bedtime.     acetaminophen (TYLENOL) 650 MG CR tablet Take 650 mg by mouth every 8 (eight) hours as needed for pain.     amoxicillin (AMOXIL) 500 MG capsule Take 500 mg by mouth daily. PRIOR TO DENTAL APPOINTMENT     Baclofen 5 MG TABS Take 1 tablet by mouth 3 (three) times daily.     bisacodyl (DULCOLAX) 10 MG suppository Place 1 suppository (10 mg total) rectally as needed for moderate constipation. 12 suppository 0   Cholecalciferol (VITAMIN D-3) 5000 units TABS Take 1  tablet by mouth daily.     clonazePAM (KLONOPIN) 1 MG tablet Take 0.5-1 tablets (0.5-1 mg total) by mouth as directed. Take 3-4 times a week only for severe panic attacks 23 tablet 0   dicyclomine (BENTYL) 10 MG capsule Take 1 capsule (10 mg total) by mouth 4 (four) times daily for 14 days. 56 capsule 0   folic acid (FOLVITE) 800 MCG tablet Take 800 mcg by mouth daily.     gabapentin (NEURONTIN) 100 MG capsule Take 100 mg by mouth 3 (three) times daily.     magnesium 30 MG tablet Take 250 mg by mouth daily. (Patient not taking: Reported on 07/20/2020)     Multiple Vitamins-Minerals (MULTIVITAMIN WITH MINERALS) tablet Take 1 tablet by mouth daily.     neomycin-polymyxin-dexameth (MAXITROL) 0.1 % OINT 1 application.      polyethylene glycol (MIRALAX) 17 g packet Take 17 g by mouth daily. 14 each 0   pravastatin (PRAVACHOL) 40 MG tablet Take 40 mg by mouth at bedtime.     propranolol ER (INDERAL LA) 60 MG 24 hr capsule Take 60 mg by mouth daily.     QUEtiapine (SEROQUEL) 100 MG tablet Take 1 tablet (100 mg total) by mouth at bedtime. 30 tablet 1   QUEtiapine (SEROQUEL) 25 MG tablet TAKE 1/2 TO 1 TABLET BY MOUTH DAILY AS NEEDED FOR AGITATION OR ANXIETY 30 tablet 1   spironolactone (ALDACTONE) 100 MG tablet Take 100 mg by mouth at bedtime.     TRINTELLIX 20 MG TABS tablet TAKE 1 TABLET(20 MG) BY MOUTH DAILY 30 tablet 1   vitamin B-12 (CYANOCOBALAMIN) 1000 MCG tablet Take 1,000 mcg by mouth daily. (Patient not taking: Reported on 04/28/2020)     No current facility-administered medications for this visit.     Musculoskeletal: Strength & Muscle Tone:  UTA Gait & Station:  UTA Patient leans: N/A  Psychiatric Specialty Exam: Review of Systems  Unable to perform ROS: Psychiatric disorder   There were no vitals taken for this visit.There is no height or weight on file to calculate BMI.  General Appearance: Casual  Eye Contact:  Poor  Speech:  Blocked  Volume:  Decreased  Mood:  Anxious  Affect:   Flat  Thought Process:  Coherent and Descriptions of Associations: Intact  Orientation:  Other:  month, year , self  Thought Content: Rumination   Suicidal Thoughts:  No  Homicidal Thoughts:  No  Memory:  Immediate;   Fair Recent;   Poor Remote;   Poor  Judgement:  Impaired  Insight:  Shallow  Psychomotor Activity:  Decreased  Concentration:  Concentration: Poor and Attention Span: Poor  Recall:  Poor  Fund of Knowledge: Poor  Language: Fair  Akathisia:  No  Handed:  Right  AIMS (if indicated): not done  Assets:  Desire for Improvement Housing Intimacy Transportation Vocational/Educational  ADL's:  Intact  Cognition: Impaired,unspecified  Sleep:  Fair per report most nights, can be excessive at times   Screenings: PHQ2-9    Flowsheet Row Video Visit from 03/04/2021 in [facility] Video [facility]16[facility]2020 [facility] Office [facility]13/2020 [facility]  PHQ-2 Total Score 0 0 0  PHQ-9 Total Score 2 0 --      Flowsheet Row Counselor from [facility]07/2020 in [facility]  C-SSRS RISK CATEGORY No Risk        Assessment and Plan: Jodi Parrish is a 75 year old Caucasian female, married, has a history of bipolar disorder type II, anxiety disorder, multiple medical problems including chronic pain, major neurocognitive disorder, panic disorder was evaluated by telemedicine today.  Patient continues to have cognitive impairment, and will continue to need neurology work-up.  Discussed plan as noted below.   Plan Major neurocognitive disorder unspecified-unstable Neuropsychological testing completed-06/26/2020. Currently following up with neurologist.-Dr. Malvin JohnsPotter. Patient with word recall difficulty, forgetting words, exacerbated with anxiety. Unable to rule out Alzheimer's dementia. Optimize sleep and anxiety-rule out other causes of memory  loss. MMSE-21 out of 30 dated 02/26/2021 Neurology has ordered carotid ultrasound due to recent stroke like episode, 3-hour EEG and routine EEG, start baby aspirin 81 mg, and would consider starting Aricept 5 mg at next visit.  Reviewed notes per Ms. Maralyn SagoSarah Mason-dated 02/26/2021.  MDD with mixed features-improving Trintellix 20 mg p.o. daily Gabapentin 100 mg p.o. 3 times daily Seroquel 100 mg nightly Seroquel 25 mg p.o. daily as needed for severe anxiety attacks only. Discontinue Lamictal for noncompliance.   Panic attacks-unstable Patient has been referred for CBT.  She has upcoming appointment.  I will coordinate care with therapist - Ms.Christina Hussami Trintellix as prescribed Klonopin 0.5-1 mg 3-4 times a week only for severe panic attacks Seroquel as prescribed  Alcohol use disorder in remission Patient has been sober  Sleep disorder-rule out narcolepsy Patient had sleep study completed 02/10/2021-pending report.  Collateral information obtained from husband.  Noted above.  Patient will continue to need medication management for mood, optimize sleep, as well as neurology work-up and preferably starting medication like Aricept.  However given the multiple social stressors-we will coordinate care with therapist.  Also discussed with husband to discuss with daughter and preferably trying to move into an assisted living facility or senior living community with more social support system.  Follow-up in clinic in 4-6 weeks in office or sooner as needed.  I have spent atleast  35 minutes face to face with patient today which includes the time spent for preparing to see the patient ( e.g., review of test, records ), obtaining and to review and separately obtained history , ordering medications and test ,psychoeducation and supportive psychotherapy and care coordination,as well as documenting clinical information in electronic health record,interpreting and communication of test  results  This note was generated in part or whole with voice recognition software. Voice recognition is usually quite accurate but there are transcription errors that can and very often do occur. I apologize for any typographical errors that were not detected and corrected.     Jomarie Longs, MD 03/05/2021, 7:50 AM

## 2021-03-15 ENCOUNTER — Ambulatory Visit
Admission: RE | Admit: 2021-03-15 | Discharge: 2021-03-15 | Disposition: A | Discharge status: Home / Self Care | Payer: Medicare Other | Source: Ambulatory Visit | Attending: Physician Assistant | Admitting: Physician Assistant

## 2021-03-15 ENCOUNTER — Other Ambulatory Visit: Payer: Self-pay

## 2021-03-15 DIAGNOSIS — R413 Other amnesia: Secondary | ICD-10-CM | POA: Diagnosis not present

## 2021-03-15 DIAGNOSIS — R55 Syncope and collapse: Secondary | ICD-10-CM

## 2021-03-24 ENCOUNTER — Other Ambulatory Visit: Payer: Self-pay

## 2021-03-24 ENCOUNTER — Ambulatory Visit (INDEPENDENT_AMBULATORY_CARE_PROVIDER_SITE_OTHER): Payer: Medicare Other | Admitting: Licensed Clinical Social Worker

## 2021-03-24 DIAGNOSIS — F339 Major depressive disorder, recurrent, unspecified: Secondary | ICD-10-CM

## 2021-03-24 DIAGNOSIS — F410 Panic disorder [episodic paroxysmal anxiety] without agoraphobia: Secondary | ICD-10-CM | POA: Diagnosis not present

## 2021-03-24 DIAGNOSIS — F1021 Alcohol dependence, in remission: Secondary | ICD-10-CM | POA: Diagnosis not present

## 2021-03-24 DIAGNOSIS — F0390 Unspecified dementia without behavioral disturbance: Secondary | ICD-10-CM

## 2021-03-24 NOTE — Progress Notes (Signed)
  THERAPIST PROGRESS NOTE  Session Time: 9-10a  Participation Level: Active  Behavioral Response: Neat and Well GroomedAlertAnxious and Depressed  Type of Therapy: Individual Therapy  Treatment Goals addressed: Anxiety, Coping, and Diagnosis: depression  Interventions: CBT, Psychosocial Skills: panic attack management; stress management, and Supportive  Summary: Shamiya Demeritt is a 75 y.o. female who presents with symptoms consistent with major depressive disorder, panic attacks, alcohol use disorder in sustained remission, and neurocognitive disorder. Patient reports that overall mood has been stable, and that she is using coping skills that she has learned in the past to manage situational stressful environments and panic attacks. Reviewed grounding exercises and gave patient and her husband psychoeducational packets about managing panic attacks and stress management.   Allowed patient safe space to explore her thoughts and feelings about the overall psychological impact of her neurocognitive disorder. Patient is often embarrassed by losing her train of thought, and frequently interrupts herself to apologize, which makes her lose all memory of what she wanted to discuss initially. Encouraged patient to just have a loose, free flowing conversation without the need to interrupt herself to apologize. Patient tried this and was very happy with how much easier it was to maintain flow of thought and putting her thoughts into words.   Allowed patient to explore relationships with her children and discuss thoughts and feelings surrounding the death of her oldest daughter by suicide. Patient reports that she had a nephew that also committed suicide around the same time as her daughter. Discussed this traumatic time of life, and patient support system at that time.   Discussed patient's relationship with alcohol, and overall impact of her drinking. patient is very glad that she made the decision to stop  drinking.   Patient reports that she often does not leave the house because she does not drive--and does not quite feel comfortable with the area. Patient relies on husband to take her anywhere, and states that he is often very busy on his computer all day long. Discussed importance of brain stimulation to patient and encouraged her to change her scenery/location as much as possible, even if that means just sitting outside. Discussed cognitively stimulating activities like playing cards, word games, [employer], and Scrabble game. Patient reports that she really misses being around people and then it's not something that she gets an opportunity to do frequently. Encouraged patient to go into a coffee shop to just be around other people for a brief period and see if that helps her feel more connected to others socially..   Continued recommendations are as follows: self care behaviors, positive social engagements, focusing on overall work/home/life balance, and focusing on positive physical and emotional wellness.    Suicidal/Homicidal: No  Therapist Response: Initial session with patient after transitioning from another outpatient therapist. Reviewed patient's goals and revised treatment plan. Treatment to continue as indicated.  Plan: Return again in 4 weeks.  Diagnosis: Axis I: MDD, recurrent; panic attacks, alcohol use disorder-remission, neurocognitive disorder    Axis II: No diagnosis    Ernest Haber Nathanyl Andujo, LCSW 03/24/2021

## 2021-04-13 ENCOUNTER — Other Ambulatory Visit: Payer: Self-pay

## 2021-04-13 ENCOUNTER — Ambulatory Visit (INDEPENDENT_AMBULATORY_CARE_PROVIDER_SITE_OTHER): Payer: Medicare Other | Admitting: Psychiatry

## 2021-04-13 ENCOUNTER — Encounter: Payer: Self-pay | Admitting: Psychiatry

## 2021-04-13 VITALS — BP 139/84 | HR 69 | Temp 97.9°F | Wt 161.6 lb

## 2021-04-13 DIAGNOSIS — F3341 Major depressive disorder, recurrent, in partial remission: Secondary | ICD-10-CM | POA: Diagnosis not present

## 2021-04-13 DIAGNOSIS — F0390 Unspecified dementia without behavioral disturbance: Secondary | ICD-10-CM

## 2021-04-13 DIAGNOSIS — G4733 Obstructive sleep apnea (adult) (pediatric): Secondary | ICD-10-CM

## 2021-04-13 DIAGNOSIS — F1021 Alcohol dependence, in remission: Secondary | ICD-10-CM

## 2021-04-13 DIAGNOSIS — Z8659 Personal history of other mental and behavioral disorders: Secondary | ICD-10-CM

## 2021-04-13 DIAGNOSIS — F410 Panic disorder [episodic paroxysmal anxiety] without agoraphobia: Secondary | ICD-10-CM | POA: Diagnosis not present

## 2021-04-13 MED ORDER — CLONAZEPAM 1 MG PO TABS: 0.5000 mg | ORAL_TABLET | ORAL | 0 refills | Status: DC

## 2021-04-13 MED ORDER — VORTIOXETINE HBR 20 MG PO TABS: ORAL_TABLET | ORAL | 1 refills | Status: DC

## 2021-04-13 MED ORDER — QUETIAPINE FUMARATE 100 MG PO TABS: 100.0000 mg | ORAL_TABLET | Freq: Every day | ORAL | 1 refills | Status: DC

## 2021-04-13 NOTE — Progress Notes (Signed)
BH MD OP Progress Note  04/13/2021 12:14 PM Ahniyah Giancola  MRN:  [id number]  Chief Complaint:  Chief Complaint   Follow-up; Depression; Anxiety    HPI: JAXON MYNHIER Ryke is a 75 year old Caucasian female, married, lives in [place], has a history of bipolar disorder type II, anxiety disorder, cognitive disorder, insomnia, alcohol use disorder in remission, hyperlipidemia, chronic pain, arthritis, hypertension, multiple surgeries, major neurocognitive disorder was evaluated in the office today.  Collateral information was obtained from patient's husband-Henry, patient and husband were seen separately.  Patient today appeared to be cheerful and pleasant unlike her last appointment when she appeared to be tearful had a lot of thought blocking and confused.  She reports the past few days she has been doing better.  She reports she has not had any episodes when she felt extremely anxious or did not have any episodes of confusion .  Patient reports she is compliant on her medications.  She reports sleep is good.  She reports she does struggle with pain.  She reports she follows up with her provider for her pain management.  Patient throughout the session appeared to have memory problems, she was able to give her date of birth correctly however could not give her house number, could give her street name.  She seemed to lose her train of thoughts several times during our conversation and some questions had to be repeated.  During the session reviewed and discussed her most recent sleep study dated 02/09/2021-patient recently diagnosed with obstructive sleep apnea will need CPAP titration.  Collateral information was obtained from Henry-spouse who reports that patient continues to have episodes of not being able to keep track of chores at home, unable to keep her home organized.  She would clean up the kitchen counter and the dining table one day and few days later she would put everything back again.  He  has to step up to help her with cooking, cleaning and a lot of other activities at home.  According to husband this has been a major burden on him and hence he feels more and more depressed.  They are looking into assisted living facilities, on a waiting list at [facility].  Patient had psychotherapy sessions and has upcoming appointment scheduled.  Patient denies any suicidality, homicidality or perceptual disturbances.  Visit Diagnosis:    ICD-10-CM   1. Major neurocognitive disorder (HCC)  F03.90 QUEtiapine (SEROQUEL) 100 MG tablet    2. Panic disorder  F41.0 vortioxetine HBr (TRINTELLIX) 20 MG TABS tablet    QUEtiapine (SEROQUEL) 100 MG tablet    clonazePAM (KLONOPIN) 1 MG tablet    3. MDD (major depressive disorder), recurrent, in partial remission (HCC)  F33.41 QUEtiapine (SEROQUEL) 100 MG tablet    4. OSA (obstructive sleep apnea)  G47.33     5. Alcohol use disorder, severe, in sustained remission (HCC)  F10.21     6. History of bipolar disorder  Z86.59 vortioxetine HBr (TRINTELLIX) 20 MG TABS tablet      Past Psychiatric History: I have reviewed past psychiatric history from progress note on 04/28/2020.  Past trials of medications- Celexa Prozac Zoloft Cymbalta Abilify Lamictal Adderall Trazodone Multiple other medications  Past Medical History:  Past Medical History:  Diagnosis Date   Ankle fracture    right   Anxiety    Arthritis    Bipolar disorder (HCC)    Depression    Diverticulitis    Hyperlipidemia    Hypertension  Osteoarthritis    Overactive bladder     Past Surgical History:  Procedure Laterality Date   ABDOMINAL HYSTERECTOMY  1998   BARTHOLIN GLAND CYST EXCISION  1976   COLONOSCOPY     COLONOSCOPY WITH PROPOFOL N/A 04/10/2018   Procedure: COLONOSCOPY WITH PROPOFOL;  Surgeon: Toledo, Boykin Nearing, MD;  Location: [facility];  Service: Gastroenterology;  Laterality: N/A;   ELBOW SURGERY Right 1995   crushed elbow with hardware implanted    ESOPHAGOGASTRODUODENOSCOPY (EGD) WITH PROPOFOL N/A 04/10/2018   Procedure: ESOPHAGOGASTRODUODENOSCOPY (EGD) WITH PROPOFOL;  Surgeon: Toledo, Boykin Nearing, MD;  Location: [facility] ENDOSCOPY;  Service: Gastroenterology;  Laterality: N/A;   JOINT REPLACEMENT Left 2011   knee   JOINT REPLACEMENT Right 2009   elbow   KNEE SURGERY Left 2000   PELVIC FLOOR REPAIR  2009   PELVIC FLOOR LIFT   REPLACEMENT TOTAL KNEE Left 2016   TONSILLECTOMY  1958   TOTAL HIP ARTHROPLASTY Left 2008   TOTAL HIP ARTHROPLASTY Right 01/10/2017   Procedure: TOTAL HIP ARTHROPLASTY ANTERIOR APPROACH;  Surgeon: Kennedy Bucker, MD;  Location: [facility] ORS;  Service: Orthopedics;  Laterality: Right;    Family Psychiatric History: Reviewed family psychiatric history from progress note on 04/28/2020  Family History:  Family History  Problem Relation Age of Onset   Parkinson's disease Mother    CAD Father    Hypertension Father    Anxiety disorder Daughter    Depression Daughter    Suicidality Daughter    Alcohol abuse Daughter    Anxiety disorder Daughter    Anxiety disorder Son    Breast cancer Neg Hx     Social History: Reviewed social history from progress note on 04/28/2020 Social History   Socioeconomic History   Marital status: Married    Spouse name: Not on file   Number of children: Not on file   Years of education: Not on file   Highest education level: Not on file  Occupational History   Not on file  Tobacco Use   Smoking status: Former    Types: Cigarettes   Smokeless tobacco: Never  Vaping Use   Vaping Use: Never used  Substance and Sexual Activity   Alcohol use: Not Currently    Comment: HEAVY USE IN THE PAST- QUIT 3 YEARS AGO   Drug use: No   Sexual activity: Yes  Other Topics Concern   Not on file  Social History Narrative   Not on file   Social Determinants of Health   Financial Resource Strain: Not on file  Food Insecurity: Not on file  Transportation Needs: Not on file  Physical  Activity: Not on file  Stress: Not on file  Social Connections: Not on file    Allergies:  Allergies  Allergen Reactions   Budesonide-Formoterol Fumarate     Other reaction(s): Unknown   Abilify [Aripiprazole] Anxiety    SEVERE HYPERACTIVITY   Duloxetine Anxiety    Metabolic Disorder Labs: No results found for: HGBA1C, MPG No results found for: PROLACTIN No results found for: CHOL, TRIG, HDL, CHOLHDL, VLDL, LDLCALC Lab Results  Component Value Date   TSH 0.95 09/18/2013    Therapeutic Level Labs: No results found for: LITHIUM No results found for: VALPROATE No components found for:  CBMZ  Current Medications: Current Outpatient Medications  Medication Sig Dispense Refill   Baclofen 5 MG TABS Take 1 tablet by mouth 3 (three) times daily.     bisacodyl (DULCOLAX) 10 MG suppository Place 1 suppository (10 mg  total) rectally as needed for moderate constipation. 12 suppository 0   Cholecalciferol (VITAMIN D-3) 5000 units TABS Take 1 tablet by mouth daily.     folic acid (FOLVITE) 800 MCG tablet Take 800 mcg by mouth daily.     gabapentin (NEURONTIN) 100 MG capsule Take 100 mg by mouth 3 (three) times daily.     magnesium 30 MG tablet Take 250 mg by mouth daily.     Multiple Vitamins-Minerals (MULTIVITAMIN WITH MINERALS) tablet Take 1 tablet by mouth daily.     neomycin-polymyxin-dexameth (MAXITROL) 0.1 % OINT 1 application.      polyethylene glycol (MIRALAX) 17 g packet Take 17 g by mouth daily. 14 each 0   pravastatin (PRAVACHOL) 40 MG tablet Take 40 mg by mouth at bedtime.     propranolol ER (INDERAL LA) 60 MG 24 hr capsule Take 60 mg by mouth daily.     QUEtiapine (SEROQUEL) 25 MG tablet TAKE 1/2 TO 1 TABLET BY MOUTH DAILY AS NEEDED FOR AGITATION OR ANXIETY 30 tablet 1   spironolactone (ALDACTONE) 100 MG tablet Take 100 mg by mouth at bedtime.     traZODone (DESYREL) 100 MG tablet Take 1 tablet by mouth at bedtime.     vitamin B-12 (CYANOCOBALAMIN) 1000 MCG tablet Take  1,000 mcg by mouth daily.     acetaminophen (TYLENOL) 650 MG CR tablet Take 650 mg by mouth every 8 (eight) hours as needed for pain. (Patient not taking: Reported on 04/13/2021)     amoxicillin (AMOXIL) 500 MG capsule Take 500 mg by mouth daily. PRIOR TO DENTAL APPOINTMENT (Patient not taking: Reported on 04/13/2021)     clonazePAM (KLONOPIN) 1 MG tablet Take 0.5-1 tablets (0.5-1 mg total) by mouth as directed. Take 3-4 times a week only for severe panic attacks 23 tablet 0   dicyclomine (BENTYL) 10 MG capsule Take 1 capsule (10 mg total) by mouth 4 (four) times daily for 14 days. 56 capsule 0   QUEtiapine (SEROQUEL) 100 MG tablet Take 1 tablet (100 mg total) by mouth at bedtime. 30 tablet 1   vortioxetine HBr (TRINTELLIX) 20 MG TABS tablet TAKE 1 TABLET(20 MG) BY MOUTH DAILY 30 tablet 1   No current facility-administered medications for this visit.     Musculoskeletal: Strength & Muscle Tone: within normal limits Gait & Station: normal Patient leans: N/A  Psychiatric Specialty Exam: Review of Systems  Musculoskeletal:  Positive for back pain and myalgias.  Psychiatric/Behavioral:  Negative for agitation, behavioral problems, confusion, decreased concentration, dysphoric mood, hallucinations, self-injury, sleep disturbance and suicidal ideas. The patient is not nervous/anxious and is not hyperactive.   All other systems reviewed and are negative.  Blood pressure 139/84, pulse 69, temperature 97.9 F (36.6 C), temperature source Temporal, weight 161 lb 9.6 oz (73.3 kg).Body mass index is 24.57 kg/m.  General Appearance: Casual  Eye Contact:  Good  Speech:  Normal Rate  Volume:  Normal  Mood:  Euthymic  Affect:  Full Range  Thought Process:  Linear and Descriptions of Associations: Intact  Orientation:  Full (Time, Place, and Person)  Thought Content: Logical   Suicidal Thoughts:  No  Homicidal Thoughts:  No  Memory:  Immediate;   limited Recent;   limited Remote;   limited   Judgement:  Other:  shallow  Insight:  Shallow  Psychomotor Activity:  Normal  Concentration:  Concentration: limited and Attention Span: limited  Recall:  Poor  Fund of Knowledge: Fair  Language: Fair  Akathisia:  No  Handed:  Right  AIMS (if indicated): done  Assets:  Communication Skills Desire for Improvement Housing Intimacy Social Support  ADL's:  Intact  Cognition: Impaired,likely mild  Sleep:   fair    Screenings: PHQ2-9    Flowsheet Row [facility] Video [facility]2020 [facility] Office Visit from [facility]/13/2020 [facility]  PHQ-2 Total Score 0 0 0  PHQ-9 Total Score 2 0 --      Flowsheet Row Office Visit from 04/13/2021 in [facility] Counselor from 03/24/2021 in [facility] Counselor from 12/07/2020 in [facility]  C-SSRS RISK CATEGORY No Risk No Risk No Risk        Assessment and Plan: Mikey KirschnerJane A De Ryke is a 75 year old Caucasian female, married, has a history of bipolar disorder type II, anxiety disorder, multiple medical problems including chronic pain, major neurocognitive disorder, pain disorder was evaluated in office today.  Patient is currently making some progress with regards to her mood however continues to have memory problems as well as had sleep study that showed sleep apnea and will need CPAP titration.  Plan as noted below.  Plan Major neurocognitive disorder unspecified-unstable Neuropsychological testing completed-06/26/2020 Currently follows up with Dr. Malvin JohnsPotter Most recent MMSE-21 out of 30-dated 02/26/2021. Patient with word recall difficulty, exacerbated with anxiety-unable to rule out Alzheimer's dementia. Reviewed notes per Ms.Mason -neurology-dated 03/26/2021-patient with MRI brain that showed stable white matter change, mild  volume loss.  Patient with carotid ultrasound-mild plaques-cannot stand referral to Vein and Vascular  MDD with mixed features-in partial remission Trintellix 20 mg p.o. daily Gabapentin 100 mg p.o. 3 times daily Seroquel 100 mg p.o. nightly Seroquel 25 mg p.o. daily as needed for severe anxiety attacks only  Panic disorder-improving Continue CBT with Ms. Christina Hussami Klonopin 0.5-1 mg as needed for severe panic attacks only.  Patient to limit use Seroquel as prescribed  Alcohol use disorder in remission Patient has been sober  Obstructive sleep apnea-unstable I have reviewed and discussed most recent sleep study-dated 03/10/2021 per Dr. Junius ArgylePaul Scott Bennett-patient with diagnosis of obstructive sleep apnea, chronic insomnia disorder. She was found to have mild to moderate OSA.  Would recommend CPAP titration and management of OSA as well as her issues with insomnia.  A mandibular repositioning device is an alternative to CPAP.  With a sleep study showing OSA the MSLT if it was performed cannot diagnose narcolepsy in the setting of untreated sleep apnea. OSA should be treated first for several weeks and then reassess any need for further evaluation.  Return to sleep lab for Titration.  Will place the order today.  Collateral information obtained from husband-Henry as noted above.  Follow-up in clinic in 7 to 8 weeks in office.  This note was generated in part or whole with voice recognition software. Voice recognition is usually quite accurate but there are transcription errors that can and very often do occur. I apologize for any typographical errors that were not detected and corrected.      Jomarie LongsSaramma Maribella Kuna, MD 04/13/2021, 12:14 PM

## 2021-04-13 NOTE — Patient Instructions (Signed)
Living With Sleep Apnea Sleep apnea is a condition in which breathing pauses or becomes shallow during sleep. Sleep apnea is most commonly caused by a collapsed or blocked airway. People with sleep apnea usually snore loudly. They may have times when they gasp and stop breathing for 10 seconds or more during sleep. This may happenmany times during the night. The breaks in breathing also interrupt the deep sleep that you need to feel rested. Even if you do not completely wake up from the gaps in breathing, your sleep may not be restful and you feel tired during the day. You may also have a headache in the morning and low energy during the day, and you may feel anxiousor depressed. How can sleep apnea affect me? Sleep apnea increases your chances of extreme tiredness during the day (daytime fatigue). It can also increase your risk for health conditions, such as: Heart attack. Stroke. Obesity. Type 2 diabetes. Heart failure. Irregular heartbeat. High blood pressure. If you have daytime fatigue as a result of sleep apnea, you may be more likely to: Perform poorly at school or work. Fall asleep while driving. Have difficulty with attention. Develop depression or anxiety. Have sexual dysfunction. What actions can I take to manage sleep apnea? Sleep apnea treatment  If you were given a device to open your airway while you sleep, use it only as told by your health care provider. You may be given: An oral appliance. This is a custom-made mouthpiece that shifts your lower jaw forward. A continuous positive airway pressure (CPAP) device. This device blows air through a mask when you breathe out (exhale). A nasal expiratory positive airway pressure (EPAP) device. This device has valves that you put into each nostril. A bi-level positive airway pressure (BPAP) device. This device blows air through a mask when you breathe in (inhale) and breathe out (exhale). You may need surgery if other treatments do  not work for you.  Sleep habits Go to sleep and wake up at the same time every day. This helps set your internal clock (circadian rhythm) for sleeping. If you stay up later than usual, such as on weekends, try to get up in the morning within 2 hours of your normal wake time. Try to get at least 7-9 hours of sleep each night. Stop using a computer, tablet, and mobile phone a few hours before bedtime. Do not take long naps during the day. If you nap, limit it to 30 minutes. Have a relaxing bedtime routine. Reading or listening to music may relax you and help you sleep. Use your bedroom only for sleep. Keep your television and computer out of your bedroom. Keep your bedroom cool, dark, and quiet. Use a supportive mattress and pillows. Follow your health care provider's instructions for other changes to sleep habits. Nutrition Do not eat heavy meals in the evening. Do not have caffeine in the later part of the day. The effects of caffeine can last for more than 5 hours. Follow your health care provider's or dietitian's instructions for any diet changes. Lifestyle     Do not drink alcohol before bedtime. Alcohol can cause you to fall asleep at first, but then it can cause you to wake up in the middle of the night and have trouble getting back to sleep. Do not use any products that contain nicotine or tobacco. These products include cigarettes, chewing tobacco, and vaping devices, such as e-cigarettes. If you need help quitting, ask your health care provider. Medicines Take over-the-counter   and prescription medicines only as told by your health care provider. Do not use over-the-counter sleep medicine. You can become dependent on this medicine, and it can make sleep apnea worse. Do not use medicines, such as sedatives and narcotics, unless told by your health care provider. Activity Exercise on most days, but avoid exercising in the evening. Exercising near bedtime can interfere with  sleeping. If possible, spend time outside every day. Natural light helps regulate your circadian rhythm. General information Lose weight if you need to, and maintain a healthy weight. Keep all follow-up visits. This is important. If you are having surgery, make sure to tell your health care provider that you have sleep apnea. You may need to bring your device with you. Where to find more information Learn more about sleep apnea and daytime fatigue from: American Sleep Association: sleepassociation.org National Sleep Foundation: sleepfoundation.org National Heart, Lung, and Blood Institute: nhlbi.nih.gov Summary Sleep apnea is a condition in which breathing pauses or becomes shallow during sleep. Sleep apnea can cause daytime fatigue and other serious health conditions. You may need to wear a device while sleeping to help keep your airway open. If you are having surgery, make sure to tell your health care provider that you have sleep apnea. You may need to bring your device with you. Making changes to sleep habits, diet, lifestyle, and activity can help you manage sleep apnea. This information is not intended to replace advice given to you by your health care provider. Make sure you discuss any questions you have with your healthcare provider. Document Revised: 08/14/2020 Document Reviewed: 08/14/2020 Elsevier Patient Education  2022 Elsevier Inc.  

## 2021-04-17 ENCOUNTER — Other Ambulatory Visit: Payer: Self-pay | Admitting: Psychiatry

## 2021-04-17 DIAGNOSIS — F410 Panic disorder [episodic paroxysmal anxiety] without agoraphobia: Secondary | ICD-10-CM

## 2021-05-05 ENCOUNTER — Ambulatory Visit (INDEPENDENT_AMBULATORY_CARE_PROVIDER_SITE_OTHER): Payer: Medicare Other | Admitting: Licensed Clinical Social Worker

## 2021-05-05 ENCOUNTER — Other Ambulatory Visit: Payer: Self-pay

## 2021-05-05 DIAGNOSIS — F0390 Unspecified dementia without behavioral disturbance: Secondary | ICD-10-CM

## 2021-05-05 DIAGNOSIS — F3341 Major depressive disorder, recurrent, in partial remission: Secondary | ICD-10-CM

## 2021-05-05 NOTE — Progress Notes (Signed)
   THERAPIST PROGRESS NOTE  Session Time: 10-10:45a  Participation Level: Active  Behavioral Response: NeatAlertAnxious  Type of Therapy: Individual Therapy  Treatment Goals addressed: Anxiety and Diagnosis: depression  Interventions: CBT and Supportive  Summary: Jodi Parrish is a 75 y.o. female who presents with improving symptoms related to depression diagnosis. Patient reports that she feels like a new person. Patient reports improving motivation and initiative. Patient is being intentional about positive self talk and positive iti in general. Patient reports that mood is stable and that she's managing situational stressors well. Patient states that she's sleeping well, up to 7 or 8 hours a night. Patient states that she is in discussion currently with her husband about moving into a assisted living facility. Patient is excited about this move, and about getting a higher level of care, if needed. Patient reports some pain in knees and back and arms and shoulders. Discussed recent visit with son, and how the visit made patient feel so happy..   Suicidal/Homicidal: No  Therapist Response: Lucy is able to describe current and past experiences with specific fears, prominent worries, and anxiety symptoms including their impact on functioning and multiple attempts to resolve it. Patient is able to identify and anxiety coping mechanism that has been successful in the past and increase its use. Patient is able to verbalize and understanding of how thoughts, feelings, and behaviors contribute to anxiety and overall treatment. Patient reports that she has healthy interpersonal relationships that help prevent relapse of depression symptoms. Patient is continuing to develop ability to recognize, accept, and cope with feelings of depression and utilize coping skills that help manage these feelings. These behaviors aren't reflective of continuing personal growth and progress. Treatment and continue as  indicated.  Plan: Return again in 4 weeks.  Diagnosis: Axis I: MDD, recurrent    Axis II: No diagnosis    Ernest Haber Hala Narula, LCSW 05/05/2021

## 2021-05-10 ENCOUNTER — Other Ambulatory Visit
Admission: RE | Admit: 2021-05-10 | Discharge: 2021-05-10 | Disposition: A | Discharge status: Home / Self Care | Payer: Medicare Other | Source: Ambulatory Visit | Attending: Psychiatry | Admitting: Psychiatry

## 2021-05-10 ENCOUNTER — Other Ambulatory Visit: Payer: Self-pay

## 2021-05-10 DIAGNOSIS — Z01812 Encounter for preprocedural laboratory examination: Secondary | ICD-10-CM | POA: Diagnosis present

## 2021-05-10 DIAGNOSIS — Z20822 Contact with and (suspected) exposure to covid-19: Secondary | ICD-10-CM | POA: Insufficient documentation

## 2021-05-10 LAB — SARS CORONAVIRUS 2 (TAT 6-24 HRS): SARS Coronavirus 2: NEGATIVE

## 2021-05-11 ENCOUNTER — Other Ambulatory Visit: Payer: Self-pay | Admitting: Psychiatry

## 2021-05-11 DIAGNOSIS — F3341 Major depressive disorder, recurrent, in partial remission: Secondary | ICD-10-CM

## 2021-05-11 DIAGNOSIS — F410 Panic disorder [episodic paroxysmal anxiety] without agoraphobia: Secondary | ICD-10-CM

## 2021-05-11 DIAGNOSIS — F0390 Unspecified dementia without behavioral disturbance: Secondary | ICD-10-CM

## 2021-05-12 ENCOUNTER — Ambulatory Visit: Payer: Medicare Other | Attending: Neurology

## 2021-05-12 DIAGNOSIS — G4733 Obstructive sleep apnea (adult) (pediatric): Secondary | ICD-10-CM | POA: Diagnosis not present

## 2021-05-13 ENCOUNTER — Other Ambulatory Visit: Payer: Self-pay

## 2021-05-26 ENCOUNTER — Telehealth: Payer: Self-pay

## 2021-05-26 DIAGNOSIS — G4733 Obstructive sleep apnea (adult) (pediatric): Secondary | ICD-10-CM

## 2021-05-26 MED ORDER — UNABLE TO FIND: 1.0000 | Status: DC

## 2021-05-26 NOTE — Telephone Encounter (Signed)
Medication management - Telephone call with Gina, representative at [facility], to follow up on pt's recent sleep study with [facility] and how patient can get their C-PAP machine and supplies.  Collateral reported they would need an order that goes by the sleep study recommendation for the settings on the C-PAP machine and mask size and supplies, along with a copy of the sleep study and initial progress note from provider of sleep issues.  Informed Dr. Eappen of requirements and will work with CMA to have items sent to [facility] who will then reach out to patient for the machine.  

## 2021-05-26 NOTE — Telephone Encounter (Signed)
I have printed out prescription for CPAP machine and supplies.  We will have Shanda Bumps CMA fax it to sleep clinic[facility].

## 2021-06-01 ENCOUNTER — Ambulatory Visit (INDEPENDENT_AMBULATORY_CARE_PROVIDER_SITE_OTHER): Payer: Medicare Other | Admitting: Psychiatry

## 2021-06-01 ENCOUNTER — Other Ambulatory Visit: Payer: Self-pay

## 2021-06-01 ENCOUNTER — Encounter: Payer: Self-pay | Admitting: Psychiatry

## 2021-06-01 VITALS — BP 142/86 | HR 75 | Temp 98.5°F | Wt 166.4 lb

## 2021-06-01 DIAGNOSIS — G4733 Obstructive sleep apnea (adult) (pediatric): Secondary | ICD-10-CM | POA: Insufficient documentation

## 2021-06-01 DIAGNOSIS — F3341 Major depressive disorder, recurrent, in partial remission: Secondary | ICD-10-CM | POA: Insufficient documentation

## 2021-06-01 DIAGNOSIS — F3342 Major depressive disorder, recurrent, in full remission: Secondary | ICD-10-CM | POA: Diagnosis not present

## 2021-06-01 DIAGNOSIS — F410 Panic disorder [episodic paroxysmal anxiety] without agoraphobia: Secondary | ICD-10-CM

## 2021-06-01 DIAGNOSIS — F0390 Unspecified dementia without behavioral disturbance: Secondary | ICD-10-CM | POA: Diagnosis not present

## 2021-06-01 DIAGNOSIS — R030 Elevated blood-pressure reading, without diagnosis of hypertension: Secondary | ICD-10-CM

## 2021-06-01 DIAGNOSIS — F1021 Alcohol dependence, in remission: Secondary | ICD-10-CM

## 2021-06-01 DIAGNOSIS — Z8659 Personal history of other mental and behavioral disorders: Secondary | ICD-10-CM

## 2021-06-01 MED ORDER — QUETIAPINE FUMARATE 25 MG PO TABS: ORAL_TABLET | ORAL | 1 refills | Status: DC

## 2021-06-01 MED ORDER — VORTIOXETINE HBR 20 MG PO TABS: ORAL_TABLET | ORAL | 1 refills | Status: DC

## 2021-06-01 MED ORDER — CLONAZEPAM 1 MG PO TABS: 0.5000 mg | ORAL_TABLET | ORAL | 2 refills | Status: DC

## 2021-06-01 MED ORDER — UNABLE TO FIND: 1.0000 | Status: AC

## 2021-06-01 NOTE — Progress Notes (Signed)
BH MD OP Progress Note  06/01/2021 4:23 PM Jodi Parrish  MRN:  [id number]  Chief Complaint:  Chief Complaint   Follow-up; Anxiety; Depression    HPI: Jodi Parrish is a 75 year old Caucasian female, married, lives in [place], has a history of bipolar disorder type II, anxiety disorder, MDD, obstructive sleep apnea, alcohol use disorder in remission, multiple surgeries, major neurocognitive disorder was evaluated in office today.  Patient appeared to be pleasant at the beginning of the session, was cooperative.  However patient became anxious when writer started doing an MMSE.  Patient however scored better than last time, 24 out of 30.  Patient reports overall mood symptoms as better than before.  She has not had any significant panic attacks.  Patient was diagnosed with obstructive sleep apnea, reports she has upcoming appointment for her CPAP.  She reports sleep is overall improving.  Patient denies any suicidality, homicidality or perceptual disturbances.  Patient was able to give the correct date, the month, the day and the year.  Patient however could not give the right place name, the county or the hospital name.  Patient became more and more anxious when writer asked questions to assess her memory.  Patient's 3 word recall immediately was 3 out of 3 and after 5 minutes was 2 out of 3.  Patient denies any other concerns today.    Visit Diagnosis:    ICD-10-CM   1. Major neurocognitive disorder (HCC)  F03.90     2. Panic disorder  F41.0 vortioxetine HBr (TRINTELLIX) 20 MG TABS tablet    clonazePAM (KLONOPIN) 1 MG tablet    3. MDD (major depressive disorder), recurrent, in full remission (HCC)  F33.42 QUEtiapine (SEROQUEL) 25 MG tablet    4. OSA (obstructive sleep apnea)  G47.33 UNABLE TO FIND    5. Alcohol use disorder, severe, in sustained remission (HCC)  F10.21     6. History of bipolar disorder  Z86.59 vortioxetine HBr (TRINTELLIX) 20 MG TABS tablet    7.  Elevated blood pressure reading  R03.0       Past Psychiatric History: Reviewed past psychiatric history from progress note on 04/28/2020.  Past trials of medications- Celexa Prozac Zoloft Cymbalta Abilify Lamictal Adderall Trazodone Multiple other medications  Past Medical History:  Past Medical History:  Diagnosis Date   Ankle fracture    right   Anxiety    Arthritis    Bipolar disorder (HCC)    Depression    Diverticulitis    Hyperlipidemia    Hypertension    Osteoarthritis    Overactive bladder     Past Surgical History:  Procedure Laterality Date   ABDOMINAL HYSTERECTOMY  1998   BARTHOLIN GLAND CYST EXCISION  1976   COLONOSCOPY     COLONOSCOPY WITH PROPOFOL N/A 04/10/2018   Procedure: COLONOSCOPY WITH PROPOFOL;  Surgeon: Toledo, Boykin Nearing, MD;  Location: [facility];  Service: Gastroenterology;  Laterality: N/A;   ELBOW SURGERY Right 1995   crushed elbow with hardware implanted   ESOPHAGOGASTRODUODENOSCOPY (EGD) WITH PROPOFOL N/A 04/10/2018   Procedure: ESOPHAGOGASTRODUODENOSCOPY (EGD) WITH PROPOFOL;  Surgeon: Toledo, Boykin Nearing, MD;  Location: [facility];  Service: Gastroenterology;  Laterality: N/A;   JOINT REPLACEMENT Left 2011   knee   JOINT REPLACEMENT Right 2009   elbow   KNEE SURGERY Left 2000   PELVIC FLOOR REPAIR  2009   PELVIC FLOOR LIFT   REPLACEMENT TOTAL KNEE Left 2016   TONSILLECTOMY  1958   TOTAL HIP  ARTHROPLASTY Left 2008   TOTAL HIP ARTHROPLASTY Right 01/10/2017   Procedure: TOTAL HIP ARTHROPLASTY ANTERIOR APPROACH;  Surgeon: Kennedy Bucker, MD;  Location: [facility];  Service: Orthopedics;  Laterality: Right;    Family Psychiatric History: I have reviewed family psychiatric history from progress note on 04/28/2020  Family History:  Family History  Problem Relation Age of Onset   Parkinson's disease Mother    CAD Father    Hypertension Father    Anxiety disorder Daughter    Depression Daughter    Suicidality Daughter    Alcohol  abuse Daughter    Anxiety disorder Daughter    Anxiety disorder Son    Breast cancer Neg Hx     Social History: Reviewed social history from progress note on 04/28/2020 Social History   Socioeconomic History   Marital status: Married    Spouse name: Not on file   Number of children: Not on file   Years of education: Not on file   Highest education level: Not on file  Occupational History   Not on file  Tobacco Use   Smoking status: Former    Types: Cigarettes   Smokeless tobacco: Never  Vaping Use   Vaping Use: Never used  Substance and Sexual Activity   Alcohol use: Not Currently    Comment: HEAVY USE IN THE PAST- QUIT 3 YEARS AGO   Drug use: No   Sexual activity: Yes  Other Topics Concern   Not on file  Social History Narrative   Not on file   Social Determinants of Health   Financial Resource Strain: Not on file  Food Insecurity: Not on file  Transportation Needs: Not on file  Physical Activity: Not on file  Stress: Not on file  Social Connections: Not on file    Allergies:  Allergies  Allergen Reactions   Budesonide-Formoterol Fumarate     Other reaction(s): Unknown   Abilify [Aripiprazole] Anxiety    SEVERE HYPERACTIVITY   Duloxetine Anxiety    Metabolic Disorder Labs: No results found for: HGBA1C, MPG No results found for: PROLACTIN No results found for: CHOL, TRIG, HDL, CHOLHDL, VLDL, LDLCALC Lab Results  Component Value Date   TSH 0.95 09/18/2013    Therapeutic Level Labs: No results found for: LITHIUM No results found for: VALPROATE No components found for:  CBMZ  Current Medications: Current Outpatient Medications  Medication Sig Dispense Refill   acetaminophen (TYLENOL) 650 MG CR tablet Take 650 mg by mouth every 8 (eight) hours as needed for pain.     amoxicillin (AMOXIL) 500 MG capsule Take 500 mg by mouth daily. PRIOR TO DENTAL APPOINTMENT     Baclofen 5 MG TABS Take 1 tablet by mouth 3 (three) times daily.     bisacodyl (DULCOLAX)  10 MG suppository Place 1 suppository (10 mg total) rectally as needed for moderate constipation. 12 suppository 0   Cholecalciferol (VITAMIN D-3) 5000 units TABS Take 1 tablet by mouth daily.     folic acid (FOLVITE) 800 MCG tablet Take 800 mcg by mouth daily.     gabapentin (NEURONTIN) 100 MG capsule Take 100 mg by mouth 3 (three) times daily.     magnesium 30 MG tablet Take 250 mg by mouth daily.     Multiple Vitamins-Minerals (MULTIVITAMIN WITH MINERALS) tablet Take 1 tablet by mouth daily.     neomycin-polymyxin-dexameth (MAXITROL) 0.1 % OINT 1 application.      polyethylene glycol (MIRALAX) 17 g packet Take 17 g by mouth daily. 14  each 0   pravastatin (PRAVACHOL) 40 MG tablet Take 40 mg by mouth at bedtime.     propranolol ER (INDERAL LA) 60 MG 24 hr capsule Take 60 mg by mouth daily.     QUEtiapine (SEROQUEL) 100 MG tablet TAKE 1 TABLET(100 MG) BY MOUTH AT BEDTIME 30 tablet 1   spironolactone (ALDACTONE) 100 MG tablet Take 100 mg by mouth at bedtime.     traZODone (DESYREL) 100 MG tablet Take 1 tablet by mouth at bedtime.     vitamin B-12 (CYANOCOBALAMIN) 1000 MCG tablet Take 1,000 mcg by mouth daily.     clonazePAM (KLONOPIN) 1 MG tablet Take 0.5-1 tablets (0.5-1 mg total) by mouth as directed. Take 3-4 times a week only for severe panic attacks 23 tablet 2   dicyclomine (BENTYL) 10 MG capsule Take 1 capsule (10 mg total) by mouth 4 (four) times daily for 14 days. 56 capsule 0   QUEtiapine (SEROQUEL) 25 MG tablet Take half to 1 tablet daily as needed for agitation 30 tablet 1   UNABLE TO FIND 1 Device by Other route as directed. 1 Device O   vortioxetine HBr (TRINTELLIX) 20 MG TABS tablet TAKE 1 TABLET(20 MG) BY MOUTH DAILY 30 tablet 1   No current facility-administered medications for this visit.     Musculoskeletal: Strength & Muscle Tone: within normal limits Gait & Station: normal Patient leans: N/A  Psychiatric Specialty Exam: Review of Systems  Psychiatric/Behavioral:   The patient is nervous/anxious.   All other systems reviewed and are negative.  Blood pressure (!) 142/86, pulse 75, temperature 98.5 F (36.9 C), temperature source Temporal, weight 166 lb 6.4 oz (75.5 kg).Body mass index is 25.3 kg/m.  General Appearance: Casual  Eye Contact:  Good  Speech:  Normal Rate  Volume:  Normal  Mood:  Anxious  Affect:  Congruent  Thought Process:  Goal Directed and Descriptions of Associations: Intact  Orientation:  Full (Time, Place, and Person)  Thought Content: Logical   Suicidal Thoughts:  No  Homicidal Thoughts:  No  Memory:  Immediate;   Fair Recent;   Fair Remote;   limited  Judgement:  Fair  Insight:  Fair  Psychomotor Activity:  Normal  Concentration:  Concentration: Fair and Attention Span: Fair  Recall:  [employer] of Knowledge: Fair  Language: Fair  Akathisia:  No  Handed:  Right  AIMS (if indicated): done  Assets:  Communication Skills Desire for Improvement Housing Social Support  ADL's:  Intact  Cognition: Impaired,  Mild  Sleep:  improving   Screenings: Oceanographer Row Office Visit from 06/01/2021 in [facility] Video Visit from 03/04/2021 in [facility] Video Visit from 10/16/2020 in [facility] Office Visit from 04/13/2020 in [facility]  PHQ-2 Total Score 0 0 0 0  PHQ-9 Total Score -- 2 0 --      Flowsheet Row Counselor from 05/05/2021 in [facility] Office Visit from 04/13/2021 in [facility] Counselor from 03/24/2021 in [facility]  C-SSRS RISK CATEGORY No Risk No Risk No Risk        Assessment and Plan: LETRICE POLLOK Ryke is a 75 year old Caucasian female, married, has a history of bipolar disorder type II, anxiety disorder, major neurocognitive disorder, panic disorder, chronic pain was evaluated in  office today.  Patient is currently improving, discussed plan as noted below.  Plan Major neurocognitive  disorder-improving MMSE-today 06/01/2021-24 out of 30 MMSE 02/26/2021-21 out of 30. Patient had neuropsychological testing completed-06/26/2020  MDD with mixed features in remission Trintellix 20 mg p.o. daily Gabapentin 100 mg p.o. 3 times daily Seroquel 100 mg p.o. nightly Seroquel 25 mg p.o. daily as needed for severe anxiety or agitation. AIMS - 0  Alcohol use disorder in remission Patient has been sober for  Obstructive sleep apnea-unstable Sleep study-dated 03/10/2021-patient with obstructive sleep apnea. Patient was referred for CPAP titration.  Elevated blood pressure reading-unstable Patient to continue to log her blood pressure, follow up with primary care provider.   Follow-up in clinic in 2 months in person.  This note was generated in part or whole with voice recognition software. Voice recognition is usually quite accurate but there are transcription errors that can and very often do occur. I apologize for any typographical errors that were not detected and corrected.     Jomarie Longs, MD 06/01/2021, 4:23 PM

## 2021-06-03 ENCOUNTER — Telehealth: Payer: Self-pay

## 2021-06-03 NOTE — Telephone Encounter (Signed)
Attempted to contact patient to discuss-had to leave a voicemail.  Since she is having headaches which is radiating, I would recommend she gets it evaluated right away, please advise her to go to the nearest emergency department or urgent care as soon as possible and not to wait.

## 2021-06-03 NOTE — Telephone Encounter (Signed)
pt called states states that she is having headache and she having pain above her eye and that it going up and down.  I advised her to go to the ER but she stated that she will wait until she Dr. Elna Breslow calls her

## 2021-06-09 NOTE — Telephone Encounter (Signed)
they stated that they have tried to contact patient twice [employer] and they stated that they tried twice to get in touch with patient.    I tried to call patient and I left a message for him to contact them at the [phone]

## 2021-06-09 NOTE — Telephone Encounter (Signed)
they stated that they have tried to contact patient twice Called company and they stated that they tried twice to get in touch with patient.    I tried to call patient and I left a message for him to contact them at the [phone] 

## 2021-06-16 ENCOUNTER — Ambulatory Visit (INDEPENDENT_AMBULATORY_CARE_PROVIDER_SITE_OTHER): Payer: Medicare Other | Admitting: Licensed Clinical Social Worker

## 2021-06-16 ENCOUNTER — Other Ambulatory Visit: Payer: Self-pay

## 2021-06-16 DIAGNOSIS — F32A Depression, unspecified: Secondary | ICD-10-CM | POA: Diagnosis not present

## 2021-06-16 NOTE — Progress Notes (Signed)
   THERAPIST PROGRESS NOTE  Session Time: 10-11a  Participation Level: Active  Behavioral Response: DisheveledAlertDepressed  Type of Therapy: Individual Therapy  Treatment Goals addressed:   Problem: Decrease depressive symptoms and improve levels of effective functioning  Goal: LTG: Reduce frequency, intensity, and duration of depression symptoms as evidenced by: pt self report--scale of 1-10.  Outcome: Progressing  Goal: STG: Erskine Squibb "Rosette Reveal Parrish" WILL PARTICIPATE IN AT LEAST 80% OF SCHEDULED INDIVIDUAL PSYCHOTHERAPY SESSIONS Outcome: Progressing  Problem: Reduce overall frequency, intensity, and duration of the anxiety so that daily functioning is not impaired. Goal: LTG: Patient will score less than 5 on pt self report scale of 1-10 Outcome: Progressing  Goal: STG: Patient will participate in at least 80% of scheduled individual psychotherapy sessions Outcome: Progressing  Interventions:  Intervention: Assess emotional status and coping mechanisms  Intervention: Encourage patient to identify triggers  Intervention: Give positive reinforcement and praise  Intervention: Encourage new environment or opportunities for social interaction  Intervention: Assist with coping skills and behavior  Summary: Jodi Parrish is a 75 y.o. female who presents with improving symptoms consistent with depression. Pt reports that overall mood is stable and that she is managing stress and anxiety well.   Pt requested to bring husband into session in the middle of the session "I don't know why I wanted him in here, but its so he can say things if he needs to"  Reviewed coping skills, anxiety management and discussed importance of cognitively stimulating exercises to help with depression and memory.  Continued recommendations are as follows: self care behaviors, positive social engagements, focusing on overall work/home/life balance, and focusing on positive physical and emotional wellness.     Suicidal/Homicidal: No  Therapist Response: Pt is continuing to apply interventions learned in session into daily life situations. Pt is currently on track to meet goals utilizing interventions mentioned above. Personal growth and progress noted. Treatment to continue as indicated.    Plan: Return again in 4 weeks.  Diagnosis: Axis I: depression, unspecified    Axis II: No diagnosis    Ernest Haber Jaecion Dempster, LCSW 06/16/2021

## 2021-06-16 NOTE — Plan of Care (Signed)
  Problem: Decrease depressive symptoms and improve levels of effective functioning Goal: LTG: Reduce frequency, intensity, and duration of depression symptoms as evidenced by: pt self report--scale of 1-10.  Outcome: Progressing Goal: STG: Jodi Squibb "Rosette Reveal Ryke" WILL PARTICIPATE IN AT LEAST 80% OF SCHEDULED INDIVIDUAL PSYCHOTHERAPY SESSIONS Outcome: Progressing Intervention: WORK WITH Jodi Squibb "Rosette Reveal Ryke" TO IDENTIFY THE MAJOR COMPONENTS OF A RECENT EPISODE OF DEPRESSION: PHYSICAL SYMPTOMS, MAJOR THOUGHTS AND IMAGES, AND MAJOR BEHAVIORS THEY EXPERIENCED Intervention: Encourage new environment or opportunities for social interaction Intervention: Assist with coping skills and behavior   Problem: Reduce overall frequency, intensity, and duration of the anxiety so that daily functioning is not impaired. Goal: LTG: Patient will score less than 5 on pt self report scale of 1-10 Outcome: Progressing Goal: STG: Patient will participate in at least 80% of scheduled individual psychotherapy sessions Outcome: Progressing Intervention: Assess emotional status and coping mechanisms Intervention: Encourage patient to identify triggers Intervention: Give positive reinforcement and praise

## 2021-07-27 ENCOUNTER — Ambulatory Visit (INDEPENDENT_AMBULATORY_CARE_PROVIDER_SITE_OTHER): Payer: Medicare Other | Admitting: Psychiatry

## 2021-07-27 ENCOUNTER — Other Ambulatory Visit: Payer: Self-pay

## 2021-07-27 ENCOUNTER — Encounter: Payer: Self-pay | Admitting: Psychiatry

## 2021-07-27 VITALS — BP 146/88 | HR 75 | Temp 98.1°F | Wt 175.4 lb

## 2021-07-27 DIAGNOSIS — Z8659 Personal history of other mental and behavioral disorders: Secondary | ICD-10-CM

## 2021-07-27 DIAGNOSIS — F0390 Unspecified dementia without behavioral disturbance: Secondary | ICD-10-CM | POA: Diagnosis not present

## 2021-07-27 DIAGNOSIS — G4733 Obstructive sleep apnea (adult) (pediatric): Secondary | ICD-10-CM

## 2021-07-27 DIAGNOSIS — F3342 Major depressive disorder, recurrent, in full remission: Secondary | ICD-10-CM | POA: Diagnosis not present

## 2021-07-27 DIAGNOSIS — F410 Panic disorder [episodic paroxysmal anxiety] without agoraphobia: Secondary | ICD-10-CM

## 2021-07-27 DIAGNOSIS — F1021 Alcohol dependence, in remission: Secondary | ICD-10-CM

## 2021-07-27 MED ORDER — QUETIAPINE FUMARATE 100 MG PO TABS: ORAL_TABLET | ORAL | 2 refills | Status: DC

## 2021-07-27 MED ORDER — VORTIOXETINE HBR 20 MG PO TABS: ORAL_TABLET | ORAL | 2 refills | Status: DC

## 2021-07-27 MED ORDER — QUETIAPINE FUMARATE 25 MG PO TABS: ORAL_TABLET | ORAL | 2 refills | Status: DC

## 2021-07-27 NOTE — Progress Notes (Signed)
BH MD OP Progress Note  07/27/2021 3:40 PM Jodi Parrish  MRN:  [id number]  Chief Complaint:  Chief Complaint   Follow-up; Anxiety    HPI: Jodi Parrish is a 75 year old Caucasian female, married, lives in [place], has a history of bipolar disorder type II, major neurocognitive disorder, panic disorder, MDD, OSA, alcohol use disorder in remission was evaluated in office today.  Patient appeared to have thought blocking , was able to answer questions however had a lot of trouble, ongoing since the past several months.  Patient states some days are better than others.  MMSE recently-24 out of 30.  Patient denies any significant depression symptoms today.  She reports she does have aches and pains, joint pain which does affect her mood . When she cannot do certain activities around the house, she does feel anxious . Reports she and her husband are planning to move into a senior living housing.  That does make her anxious however she has been coping okay.  Patient reports sleep is overall okay.  Currently noncompliant with CPAP.  She had sleep study completed.  Patient was alert, oriented only to self, situation, month and year.  Had trouble stating her address completely as well.  Per review of neurology notes patient was recently started on Aricept.  She does not know if she has started taking that medication yet or not.  She will verify when she returns home and Pharmacist, hospital know.  Patient denies any suicidality, homicidality or perceptual disturbances.  Patient denies any other concerns today.  Visit Diagnosis:    ICD-10-CM   1. Major neurocognitive disorder (HCC)  F03.90 QUEtiapine (SEROQUEL) 100 MG tablet    2. Panic disorder  F41.0 QUEtiapine (SEROQUEL) 100 MG tablet    vortioxetine HBr (TRINTELLIX) 20 MG TABS tablet    3. MDD (major depressive disorder), recurrent, in full remission (HCC)  F33.42 QUEtiapine (SEROQUEL) 25 MG tablet    4. OSA (obstructive sleep apnea)  G47.33      5. Alcohol use disorder, severe, in sustained remission (HCC)  F10.21     6. History of bipolar disorder  Z86.59 QUEtiapine (SEROQUEL) 100 MG tablet      Past Psychiatric History: Reviewed past psychiatric history from progress note on 04/28/2020.  Past trials of medications- Celexa Prozac Zoloft Cymbalta Abilify Lamictal Adderall Trazodone Multiple other medications.  Past Medical History:  Past Medical History:  Diagnosis Date   Ankle fracture    right   Anxiety    Arthritis    Bipolar disorder (HCC)    Depression    Diverticulitis    Hyperlipidemia    Hypertension    Osteoarthritis    Overactive bladder     Past Surgical History:  Procedure Laterality Date   ABDOMINAL HYSTERECTOMY  1998   BARTHOLIN GLAND CYST EXCISION  1976   COLONOSCOPY     COLONOSCOPY WITH PROPOFOL N/A 04/10/2018   Procedure: COLONOSCOPY WITH PROPOFOL;  Surgeon: Toledo, Boykin Nearing, MD;  Location: [facility];  Service: Gastroenterology;  Laterality: N/A;   ELBOW SURGERY Right 1995   crushed elbow with hardware implanted   ESOPHAGOGASTRODUODENOSCOPY (EGD) WITH PROPOFOL N/A 04/10/2018   Procedure: ESOPHAGOGASTRODUODENOSCOPY (EGD) WITH PROPOFOL;  Surgeon: Toledo, Boykin Nearing, MD;  Location: [facility];  Service: Gastroenterology;  Laterality: N/A;   JOINT REPLACEMENT Left 2011   knee   JOINT REPLACEMENT Right 2009   elbow   KNEE SURGERY Left 2000   PELVIC FLOOR REPAIR  2009  PELVIC FLOOR LIFT   REPLACEMENT TOTAL KNEE Left 2016   TONSILLECTOMY  1958   TOTAL HIP ARTHROPLASTY Left 2008   TOTAL HIP ARTHROPLASTY Right 01/10/2017   Procedure: TOTAL HIP ARTHROPLASTY ANTERIOR APPROACH;  Surgeon: Kennedy Bucker, MD;  Location: [facility];  Service: Orthopedics;  Laterality: Right;    Family Psychiatric History: Reviewed family psychiatric history from progress note on 04/28/2020.  Family History:  Family History  Problem Relation Age of Onset   Parkinson's disease Mother    CAD Father     Hypertension Father    Anxiety disorder Daughter    Depression Daughter    Suicidality Daughter    Alcohol abuse Daughter    Anxiety disorder Daughter    Anxiety disorder Son    Breast cancer Neg Hx     Social History: Reviewed social history from progress note on 04/28/2020. Social History   Socioeconomic History   Marital status: Married    Spouse name: Not on file   Number of children: Not on file   Years of education: Not on file   Highest education level: Not on file  Occupational History   Not on file  Tobacco Use   Smoking status: Former    Types: Cigarettes   Smokeless tobacco: Never  Vaping Use   Vaping Use: Never used  Substance and Sexual Activity   Alcohol use: Not Currently    Comment: HEAVY USE IN THE PAST- QUIT 3 YEARS AGO   Drug use: No   Sexual activity: Yes  Other Topics Concern   Not on file  Social History Narrative   Not on file   Social Determinants of Health   Financial Resource Strain: Not on file  Food Insecurity: Not on file  Transportation Needs: Not on file  Physical Activity: Not on file  Stress: Not on file  Social Connections: Not on file    Allergies:  Allergies  Allergen Reactions   Budesonide-Formoterol Fumarate     Other reaction(s): Unknown   Abilify [Aripiprazole] Anxiety    SEVERE HYPERACTIVITY   Duloxetine Anxiety    Metabolic Disorder Labs: No results found for: HGBA1C, MPG No results found for: PROLACTIN No results found for: CHOL, TRIG, HDL, CHOLHDL, VLDL, LDLCALC Lab Results  Component Value Date   TSH 0.95 09/18/2013    Therapeutic Level Labs: No results found for: LITHIUM No results found for: VALPROATE No components found for:  CBMZ  Current Medications: Current Outpatient Medications  Medication Sig Dispense Refill   acetaminophen (TYLENOL) 650 MG CR tablet Take 650 mg by mouth every 8 (eight) hours as needed for pain.     Baclofen 5 MG TABS Take 1 tablet by mouth 3 (three) times daily.      Cholecalciferol (VITAMIN D-3) 5000 units TABS Take 1 tablet by mouth daily.     clonazePAM (KLONOPIN) 1 MG tablet Take 0.5-1 tablets (0.5-1 mg total) by mouth as directed. Take 3-4 times a week only for severe panic attacks 23 tablet 2   folic acid (FOLVITE) 800 MCG tablet Take 800 mcg by mouth daily.     gabapentin (NEURONTIN) 100 MG capsule Take 100 mg by mouth 3 (three) times daily.     magnesium 30 MG tablet Take 250 mg by mouth daily.     Multiple Vitamins-Minerals (MULTIVITAMIN WITH MINERALS) tablet Take 1 tablet by mouth daily.     pravastatin (PRAVACHOL) 40 MG tablet Take 40 mg by mouth at bedtime.     propranolol ER (INDERAL  LA) 60 MG 24 hr capsule Take 60 mg by mouth daily.     spironolactone (ALDACTONE) 100 MG tablet Take 100 mg by mouth at bedtime.     traZODone (DESYREL) 100 MG tablet Take 1 tablet by mouth at bedtime.     trimethoprim-polymyxin b (POLYTRIM) ophthalmic solution 1 drop every 4 (four) hours.     UNABLE TO FIND 1 Device by Other route as directed. 1 Device O   vitamin B-12 (CYANOCOBALAMIN) 1000 MCG tablet Take 1,000 mcg by mouth daily.     dicyclomine (BENTYL) 10 MG capsule Take 1 capsule (10 mg total) by mouth 4 (four) times daily for 14 days. 56 capsule 0   QUEtiapine (SEROQUEL) 100 MG tablet TAKE 1 TABLET(100 MG) BY MOUTH AT BEDTIME 30 tablet 2   QUEtiapine (SEROQUEL) 25 MG tablet Take half to 1 tablet daily as needed for agitation 30 tablet 2   vortioxetine HBr (TRINTELLIX) 20 MG TABS tablet TAKE 1 TABLET(20 MG) BY MOUTH DAILY 30 tablet 2   No current facility-administered medications for this visit.     Musculoskeletal: Strength & Muscle Tone: within normal limits Gait & Station: normal Patient leans: N/A  Psychiatric Specialty Exam: Review of Systems  Psychiatric/Behavioral:  The patient is nervous/anxious.   All other systems reviewed and are negative.  Blood pressure (!) 146/88, pulse 75, temperature 98.1 F (36.7 C), temperature source Temporal,  weight 175 lb 6.4 oz (79.6 kg).Body mass index is 26.67 kg/m.  General Appearance: Casual  Eye Contact:  Fair  Speech:  Blocked  Volume:  Normal  Mood:  Anxious  Affect:  Blunt  Thought Process:  Goal Directed and Descriptions of Associations: Intact  Orientation:  Other:  month, year, self, situation  Thought Content: Logical   Suicidal Thoughts:  No  Homicidal Thoughts:  No  Memory:  Immediate;   Fair Recent;   limited Remote;   limited  Judgement:  Fair  Insight:  Fair  Psychomotor Activity:  Normal  Concentration:  Concentration: Fair and Attention Span: Fair  Recall:  [employer] of Knowledge:  limited  Language: Fair  Akathisia:  No  Handed:  Right  AIMS (if indicated): done, 0  Assets:  Desire for Improvement Housing Social Support  ADL's:  Intact  Cognition: Impaired,  Mild  Sleep:  Fair   Screenings: Equities trader Office Visit from 07/27/2021 in [facility] Office Visit from 06/01/2021 in [facility] Video Visit from 03/04/2021 in [facility] Video Visit from 10/16/2020 in [facility] Office Visit from 04[facility]13/2020 in [facility]  PHQ-2 Total Score 1 0 0 0 0  PHQ-9 Total Score -- -- 2 0 --      Flowsheet Row Counselor from 06/16/2021 in [facility] Counselor from 05/05/2021 in [facility] Office Visit from 04/13/2021 in [facility]  C-SSRS RISK CATEGORY No Risk No Risk No Risk        Assessment and Plan: Jodi Parrish is a 75 year old Caucasian female, married, has a history of bipolar disorder type II, anxiety disorder, major neurocognitive disorder, panic disorder, chronic pain was evaluated in office today.  Patient continues to have memory problems, ongoing otherwise denies any significant mood symptoms or sleep  problems.  Plan Major neurocognitive disorder-unstable MMSE-06/01/2021-24 out of 30 Per review of most recent neurology notes-patient was started on Aricept, was awaiting cardiology clearance.  Reviewed notes per Dr. Ellouise Newer 06/21/2021-okay to start Aricept. Patient had neuropsychological testing completed-06/26/2020  MDD with mixed features in remission Trintellix 20 mg p.o. daily Gabapentin 100 mg p.o. 3 times daily Seroquel 100 mg p.o. nightly Seroquel 25 mg p.o. daily as needed for severe anxiety/agitation Aims-0  Alcohol use disorder in remission Will monitor closely  Obstructive sleep apnea-unstable Sleep study-dated 03/10/2021-patient does have obstructive sleep apnea Patient was referred for CPAP titration-pending Patient provided information for sleep clinic-phone [phone].  Patient to contact them  Follow-up in clinic in 3 months or sooner if needed.  This note was generated in part or whole with voice recognition software. Voice recognition is usually quite accurate but there are transcription errors that can and very often do occur. I apologize for any typographical errors that were not detected and corrected.     Jomarie Longs, MD 07/28/2021, 8:58 AM

## 2021-07-27 NOTE — Patient Instructions (Addendum)
Please call Sleep clinic - [phone] - if not already on CPAP.Also could call [phone] .  Please verify if you are already on Aricept

## 2021-08-05 DIAGNOSIS — I779 Disorder of arteries and arterioles, unspecified: Secondary | ICD-10-CM | POA: Insufficient documentation

## 2021-08-16 ENCOUNTER — Ambulatory Visit (INDEPENDENT_AMBULATORY_CARE_PROVIDER_SITE_OTHER): Payer: Medicare Other | Admitting: Licensed Clinical Social Worker

## 2021-08-16 ENCOUNTER — Other Ambulatory Visit: Payer: Self-pay

## 2021-08-16 DIAGNOSIS — F32A Depression, unspecified: Secondary | ICD-10-CM

## 2021-08-16 NOTE — Plan of Care (Signed)
  Problem: Decrease depressive symptoms and improve levels of effective functioning Goal: LTG: Reduce frequency, intensity, and duration of depression symptoms as evidenced by: pt self report--scale of 1-10.  Outcome: Progressing Goal: STG: Erskine Squibb "Rosette Reveal Ryke" WILL PARTICIPATE IN AT LEAST 80% OF SCHEDULED INDIVIDUAL PSYCHOTHERAPY SESSIONS Outcome: Progressing   Problem: Reduce overall frequency, intensity, and duration of the anxiety so that daily functioning is not impaired. Goal: LTG: Patient will score less than 5 on pt self report scale of 1-10 Outcome: Progressing Goal: STG: Patient will participate in at least 80% of scheduled individual psychotherapy sessions Outcome: Progressing

## 2021-08-16 NOTE — Progress Notes (Signed)
Virtual Visit via Audio Note  I connected with Jodi Parrish on 08/16/21 at 11:00 AM EST by an audio enabled telemedicine application and verified that I am speaking with the correct person using two identifiers.  Location: Patient: home Provider: remote office [place], [place])   I discussed the limitations of evaluation and management by telemedicine and the availability of in person appointments. The patient expressed understanding and agreed to proceed.  I discussed the assessment and treatment plan with the patient. The patient was provided an opportunity to ask questions and all were answered. The patient agreed with the plan and demonstrated an understanding of the instructions.   The patient was advised to call back or seek an in-person evaluation if the symptoms worsen or if the condition fails to improve as anticipated.  I provided 37 minutes of non-face-to-face time during this encounter.   [name], LCSW   THERAPIST PROGRESS NOTE  Session Time: [phone]  Participation Level: Active  Behavioral Response: DisheveledAlertDepressed  Type of Therapy: Individual Therapy  Treatment Goals addressed:   Problem: Decrease depressive symptoms and improve levels of effective functioning  Goal: LTG: Reduce frequency, intensity, and duration of depression symptoms as evidenced by: pt self report--scale of 1-10.  Outcome: Progressing  Goal: STG: Jodi Squibb "Rosette Reveal Parrish" WILL PARTICIPATE IN AT LEAST 80% OF SCHEDULED INDIVIDUAL PSYCHOTHERAPY SESSIONS Outcome: Progressing  Problem: Reduce overall frequency, intensity, and duration of the anxiety so that daily functioning is not impaired. Goal: LTG: Patient will score less than 5 on pt self report scale of 1-10 Outcome: Progressing  Goal: STG: Patient will participate in at least 80% of scheduled individual psychotherapy sessions Outcome: Progressing  Interventions:  Intervention: Assess emotional status and coping  mechanisms  Intervention: Encourage patient to identify triggers  Intervention: Give positive reinforcement and praise  Intervention: Encourage new environment or opportunities for social interaction  Intervention: Assist with coping skills and behavior  Summary: Jodi Parrish is a 75 y.o. female who presents with improving symptoms consistent with depression. Pt reports that overall mood is stable and that she is managing stress and anxiety well. Pt reports that she is compliant with her medication and that her husband is the one that manages her medication. Pt has some concerns over recent weight gain. Encouraged pt to discuss with PCP.   Pt reports that she is still cleaning out the house and packing/unpacking things to prepare for upcoming move into retirement community. Pt reports that they are now number 1 on the wait list. Explored anxiety triggers and depression/mood triggers with Jodi Parrish and allowed Jodi Parrish to discuss coping skills that work best for her to manage anxiety and depression. Reviewed content/coping skills a few times throughout session.   Reviewed coping skills, anxiety management and discussed importance of cognitively stimulating exercises to help with depression and memory.  Continued recommendations are as follows: self care behaviors, positive social engagements, focusing on overall work/home/life balance, and focusing on positive physical and emotional wellness.    Suicidal/Homicidal: No  Therapist Response: Pt is continuing to apply interventions learned in session into daily life situations. Pt is currently on track to meet goals utilizing interventions mentioned above. Personal growth and progress noted. Treatment to continue as indicated.    Plan: Return again in 4 weeks.  Diagnosis: Axis I: depression, unspecified    Axis II: No diagnosis    Ernest Haber Adley Castello, LCSW 08/16/2021

## 2021-09-15 ENCOUNTER — Telehealth: Payer: Self-pay

## 2021-09-15 NOTE — Telephone Encounter (Signed)
Medication management - Telephone call with Toni Amend, from [facility] by [employer] to follow up on a received order form for CPAP equipment.  Dr. Elna Breslow reviewed and signed the itemized order form for original CPAP equipment for patient and then faxed this back to the attention of Claudette Laws at [facility], fax # [phone].  Copy of faxed orders from Dr. Elna Breslow sent to scan.

## 2021-10-20 ENCOUNTER — Ambulatory Visit (INDEPENDENT_AMBULATORY_CARE_PROVIDER_SITE_OTHER): Payer: Medicare Other | Admitting: Psychiatry

## 2021-10-20 ENCOUNTER — Other Ambulatory Visit: Payer: Self-pay

## 2021-10-20 ENCOUNTER — Encounter: Payer: Self-pay | Admitting: Psychiatry

## 2021-10-20 ENCOUNTER — Telehealth: Payer: Self-pay | Admitting: Psychiatry

## 2021-10-20 VITALS — BP 154/103 | HR 78 | Temp 98.3°F | Wt 182.0 lb

## 2021-10-20 DIAGNOSIS — F0390 Unspecified dementia without behavioral disturbance: Secondary | ICD-10-CM | POA: Diagnosis not present

## 2021-10-20 DIAGNOSIS — Z8659 Personal history of other mental and behavioral disorders: Secondary | ICD-10-CM

## 2021-10-20 DIAGNOSIS — F410 Panic disorder [episodic paroxysmal anxiety] without agoraphobia: Secondary | ICD-10-CM | POA: Diagnosis not present

## 2021-10-20 DIAGNOSIS — F331 Major depressive disorder, recurrent, moderate: Secondary | ICD-10-CM

## 2021-10-20 DIAGNOSIS — F1021 Alcohol dependence, in remission: Secondary | ICD-10-CM

## 2021-10-20 DIAGNOSIS — G4733 Obstructive sleep apnea (adult) (pediatric): Secondary | ICD-10-CM | POA: Diagnosis not present

## 2021-10-20 MED ORDER — QUETIAPINE FUMARATE 25 MG PO TABS: ORAL_TABLET | ORAL | 2 refills | Status: DC

## 2021-10-20 MED ORDER — VORTIOXETINE HBR 20 MG PO TABS: ORAL_TABLET | ORAL | 2 refills | Status: DC

## 2021-10-20 MED ORDER — QUETIAPINE FUMARATE 100 MG PO TABS: ORAL_TABLET | ORAL | 2 refills | Status: DC

## 2021-10-20 NOTE — Progress Notes (Signed)
BH MD OP Progress Note  10/20/2021 12:00 PM Lynise Porr  MRN:  [id number]  Chief Complaint:  Chief Complaint   Follow-up 76 year old Caucasian female, with history of MDD, panic disorder, neurocognitive disorder, obstructive sleep apnea presently for medication management.    HPI: DANESE DORSAINVIL Ryke is a 76 year old Caucasian female, married, lives in [place], has a history of bipolar disorder type II, major neurocognitive disorder, panic disorder, MDD, OSA, alcohol use disorder in remission was evaluated in office today.   Patient today reports she has good days and bad days.  There are some days when she feels good and does not feel sluggish and other days when she feels frustrated often and is unable to do the things that she wants to do.  Patient continues to have some thought blocking.  Unable to answer questions majority of the time appropriately.  Patient appeared to be alert, oriented to person place situation and date.  Patient was able to answer questions about her medications appropriately.  Collateral information obtained from husband steve who participated in the session.  Reports patient is currently having a lot of trouble with her CPAP device.  They were finally able to get the device couple of weeks ago.  She has started using it since the past 1 week.  Out of 6 nights that she used it she could not keep it on at least 3 of the nights.  She is having a hard time with the mask since it hurts her face and it is very uncomfortable for her to sleep with the mask on.  Patient tried different positions with the mask however that has not been helpful.  Husband agrees to give [facility] a call regarding her mask problem.  According to husband they are currently on the waiting list for [facility].  Patient has been busy getting her things packed and stored.  This has been anxiety provoking for her also.  Discussed adding an antidepressant to help with her energy concentration  during the day like Wellbutrin however patient is not interested at this time.    Visit Diagnosis:    ICD-10-CM   1. Major neurocognitive disorder (HCC)  F03.90 QUEtiapine (SEROQUEL) 100 MG tablet    2. Panic disorder  F41.0 vortioxetine HBr (TRINTELLIX) 20 MG TABS tablet    QUEtiapine (SEROQUEL) 100 MG tablet    3. Moderate episode of recurrent major depressive disorder (HCC)  F33.1 QUEtiapine (SEROQUEL) 25 MG tablet   mixed features    4. OSA (obstructive sleep apnea)  G47.33     5. Alcohol use disorder, severe, in sustained remission (HCC)  F10.21     6. History of bipolar disorder  Z86.59 QUEtiapine (SEROQUEL) 100 MG tablet      Past Psychiatric History: Reviewed past psychiatric history from progress note on 04/28/2020.  Past trials of medications Celexa Prozac Zoloft Cymbalta Abilify Lamictal Remeron Trazodone Multiple other medications.    Past Medical History:  Past Medical History:  Diagnosis Date   Ankle fracture    right   Anxiety    Arthritis    Bipolar disorder (HCC)    Depression    Diverticulitis    Hyperlipidemia    Hypertension    Osteoarthritis    Overactive bladder     Past Surgical History:  Procedure Laterality Date   ABDOMINAL HYSTERECTOMY  1998   BARTHOLIN GLAND CYST EXCISION  1976   COLONOSCOPY     COLONOSCOPY WITH PROPOFOL N/A 04/10/2018  Procedure: COLONOSCOPY WITH PROPOFOL;  Surgeon: Toledo, Boykin Nearing, MD;  Location: [facility];  Service: Gastroenterology;  Laterality: N/A;   ELBOW SURGERY Right 1995   crushed elbow with hardware implanted   ESOPHAGOGASTRODUODENOSCOPY (EGD) WITH PROPOFOL N/A 04/10/2018   Procedure: ESOPHAGOGASTRODUODENOSCOPY (EGD) WITH PROPOFOL;  Surgeon: Toledo, Boykin Nearing, MD;  Location: [facility];  Service: Gastroenterology;  Laterality: N/A;   JOINT REPLACEMENT Left 2011   knee   JOINT REPLACEMENT Right 2009   elbow   KNEE SURGERY Left 2000   PELVIC FLOOR REPAIR  2009   PELVIC FLOOR LIFT    REPLACEMENT TOTAL KNEE Left 2016   TONSILLECTOMY  1958   TOTAL HIP ARTHROPLASTY Left 2008   TOTAL HIP ARTHROPLASTY Right 01/10/2017   Procedure: TOTAL HIP ARTHROPLASTY ANTERIOR APPROACH;  Surgeon: Kennedy Bucker, MD;  Location: [facility];  Service: Orthopedics;  Laterality: Right;    Family Psychiatric History: Reviewed family psychiatric history from progress note on 04/28/2020.  Family History:  Family History  Problem Relation Age of Onset   Parkinson's disease Mother    CAD Father    Hypertension Father    Anxiety disorder Daughter    Depression Daughter    Suicidality Daughter    Alcohol abuse Daughter    Anxiety disorder Daughter    Anxiety disorder Son    Breast cancer Neg Hx     Social History: Reviewed social history from progress note on 04/28/2020. Social History   Socioeconomic History   Marital status: Married    Spouse name: Not on file   Number of children: Not on file   Years of education: Not on file   Highest education level: Not on file  Occupational History   Not on file  Tobacco Use   Smoking status: Former    Types: Cigarettes   Smokeless tobacco: Never  Vaping Use   Vaping Use: Never used  Substance and Sexual Activity   Alcohol use: Not Currently    Comment: HEAVY USE IN THE PAST- QUIT 3 YEARS AGO   Drug use: No   Sexual activity: Yes  Other Topics Concern   Not on file  Social History Narrative   Not on file   Social Determinants of Health   Financial Resource Strain: Not on file  Food Insecurity: Not on file  Transportation Needs: Not on file  Physical Activity: Not on file  Stress: Not on file  Social Connections: Not on file    Allergies:  Allergies  Allergen Reactions   Budesonide-Formoterol Fumarate     Other reaction(s): Unknown   Abilify [Aripiprazole] Anxiety    SEVERE HYPERACTIVITY   Duloxetine Anxiety    Metabolic Disorder Labs: No results found for: HGBA1C, MPG No results found for: PROLACTIN No results found for:  CHOL, TRIG, HDL, CHOLHDL, VLDL, LDLCALC Lab Results  Component Value Date   TSH 0.95 09/18/2013    Therapeutic Level Labs: No results found for: LITHIUM No results found for: VALPROATE No components found for:  CBMZ  Current Medications: Current Outpatient Medications  Medication Sig Dispense Refill   acetaminophen (TYLENOL) 650 MG CR tablet Take 650 mg by mouth every 8 (eight) hours as needed for pain.     Cholecalciferol (VITAMIN D-3) 5000 units TABS Take 1 tablet by mouth daily.     clonazePAM (KLONOPIN) 1 MG tablet Take 0.5-1 tablets (0.5-1 mg total) by mouth as directed. Take 3-4 times a week only for severe panic attacks 23 tablet 2   gabapentin (NEURONTIN) 100  MG capsule Take 100 mg by mouth 3 (three) times daily.     magnesium 30 MG tablet Take 250 mg by mouth daily.     Multiple Vitamins-Minerals (MULTIVITAMIN WITH MINERALS) tablet Take 1 tablet by mouth daily.     polyethylene glycol (MIRALAX / GLYCOLAX) 17 g packet Take 17 g by mouth daily.     pravastatin (PRAVACHOL) 40 MG tablet Take 40 mg by mouth at bedtime.     propranolol ER (INDERAL LA) 60 MG 24 hr capsule Take 60 mg by mouth daily. Takes differently     QUEtiapine (SEROQUEL) 100 MG tablet TAKE 1 TABLET(100 MG) BY MOUTH AT BEDTIME 30 tablet 2   QUEtiapine (SEROQUEL) 25 MG tablet Take half to 1 tablet daily as needed for agitation 30 tablet 2   traZODone (DESYREL) 100 MG tablet Take 1 tablet by mouth at bedtime.     trimethoprim-polymyxin b (POLYTRIM) ophthalmic solution 1 drop every 4 (four) hours.     UNABLE TO FIND 1 Device by Other route as directed. 1 Device O   vitamin B-12 (CYANOCOBALAMIN) 1000 MCG tablet Take 1,000 mcg by mouth daily.     vortioxetine HBr (TRINTELLIX) 20 MG TABS tablet TAKE 1 TABLET(20 MG) BY MOUTH DAILY 30 tablet 2   Baclofen 5 MG TABS Take 1 tablet by mouth 3 (three) times daily. (Patient not taking: Reported on 10/20/2021)     dicyclomine (BENTYL) 10 MG capsule Take 1 capsule (10 mg total) by  mouth 4 (four) times daily for 14 days. 56 capsule 0   folic acid (FOLVITE) 800 MCG tablet Take 800 mcg by mouth daily. (Patient not taking: Reported on 10/20/2021)     spironolactone (ALDACTONE) 100 MG tablet Take 100 mg by mouth at bedtime. (Patient not taking: Reported on 10/20/2021)     No current facility-administered medications for this visit.     Musculoskeletal: Strength & Muscle Tone: within normal limits Gait & Station: normal Patient leans: N/A  Psychiatric Specialty Exam: Review of Systems  Psychiatric/Behavioral:  Positive for dysphoric mood and sleep disturbance. The patient is nervous/anxious.   All other systems reviewed and are negative.  Blood pressure (!) 154/103, pulse 78, temperature 98.3 F (36.8 C), temperature source Temporal, weight 182 lb (82.6 kg).Body mass index is 27.67 kg/m.  General Appearance: Casual  Eye Contact:  Fair  Speech:  Normal Rate  Volume:  Normal  Mood:  Anxious and Depressed  Affect:  Appropriate  Thought Process:  Goal Directed and Descriptions of Associations: Intact  Orientation:  Full (Time, Place, and Person)  Thought Content: Logical   Suicidal Thoughts:  No  Homicidal Thoughts:  No  Memory:  Immediate;   Fair Recent;   Fair Remote;   limited  Judgement:  Fair  Insight:  Shallow  Psychomotor Activity:  Normal  Concentration:  Concentration: Fair and Attention Span: Fair  Recall:  FiservFair  Fund of Knowledge: Fair  Language: Fair  Akathisia:  No  Handed:  Right  AIMS (if indicated): done,0  Assets:  Communication Skills Desire for Improvement Housing Intimacy Social Support Transportation  ADL's:  Intact  Cognition: Impaired, likely Mild  Sleep:  Poor   Screenings: [id number]    Flowsheet Row Office Visit from 10/20/2021 in [facility] Office Visit from 07/27/2021 in [facility] Office Visit from 06/01/2021 in [facility] Video Visit from  03/04/2021 in [facility] Video Visit from 10/16/2020 in [facility]  PHQ-2  Total Score 3 1 0 0 0  PHQ-9 Total Score 14 -- -- 2 0      Flowsheet Row Counselor from 08/16/2021 in [facility] Counselor from 06/16/2021 in [facility] Counselor from 05/05/2021 in [facility]  C-SSRS RISK CATEGORY No Risk No Risk No Risk        Assessment and Plan: Mikey KirschnerJane A De Ryke is a 76 year old Caucasian female, married, has a history of bipolar disorder type II, anxiety disorder, major neurocognitive disorder, panic disorder, chronic pain was evaluated in office today.  Patient with continued thought blocking, memory issues continues to have good support system from her husband currently on a wait list for independent living community for seniors.  Patient currently on CPAP however reports she has not been tolerating the mask well and has not been able to use it every night.  Discussed plan as noted below.  Plan Major neurocognitive disorder-unstable MMSE-06/01/2021-24 out of 30.  We will consider repeating this in future sessions. Patient had recent follow-up with neurology-reviewed notes per Ms. Maralyn SagoSarah Mason-dated 09/06/2021-patient's memory evaluation on 09/06/2021 was 22 out of 30. Patient had neuropsychological testing completed 06/26/2020.  MDD with mixed features-unstable Discussed starting Wellbutrin, patient is not interested. Patient to address her sleep first to see if that will improve her mood symptoms. Encourage compliance with CPAP.  Also advised to contact [facility] to discuss her concerns with mass. Gabapentin 100 mg p.o. 3 times daily Seroquel 100 mg p.o. nightly Seroquel 25 mg p.o. daily as needed for severe anxiety/agitation Trintellix 20 mg p.o. daily  Obstructive sleep apnea-unstable Sleep study dated 03/10/2021-patient with obstructive sleep  apnea Patient as noted above has been unable to tolerate the mask and hence has not been able to use it every night.  Out of 6 nights that she used it in the past week only 3 nights she could use it the whole night.  Patient reports she tried different positions with the mask and it does not seem to work and it makes her very uncomfortable. Patient to contact [facility] with her concerns.  Provided phone #[phone]-[facility]. Patient has signed an ROI to release today's progress notes-will fax it to [phone].  Alcohol use disorder in remission We will monitor closely  Collateral information obtained from husband who was present in session.  Patient with elevated blood pressure reading in session, we will need to continue to follow up with primary care.  Follow-up in clinic in 2  months or sooner in person.  This note was generated in part or whole with voice recognition software. Voice recognition is usually quite accurate but there are transcription errors that can and very often do occur. I apologize for any typographical errors that were not detected and corrected.         Jomarie LongsSaramma Anavictoria Wilk, MD 10/20/2021, 12:00 PM

## 2021-10-20 NOTE — Telephone Encounter (Signed)
Received a letter from [facility] , requesting progress notes documenting satisfactory adherence to therapy and improvement of symptoms-regarding her PAP device.    Encouraged compliance.  We will have Shanda Bumps CMA sent today's progress note.  Patient already signed an ROI .

## 2021-12-08 ENCOUNTER — Encounter: Payer: Self-pay | Admitting: Psychiatry

## 2021-12-08 ENCOUNTER — Other Ambulatory Visit: Payer: Self-pay

## 2021-12-08 ENCOUNTER — Ambulatory Visit (INDEPENDENT_AMBULATORY_CARE_PROVIDER_SITE_OTHER): Payer: Medicare Other | Admitting: Psychiatry

## 2021-12-08 VITALS — BP 138/84 | HR 69 | Temp 98.3°F | Wt 185.8 lb

## 2021-12-08 DIAGNOSIS — Z8659 Personal history of other mental and behavioral disorders: Secondary | ICD-10-CM

## 2021-12-08 DIAGNOSIS — F339 Major depressive disorder, recurrent, unspecified: Secondary | ICD-10-CM | POA: Diagnosis not present

## 2021-12-08 DIAGNOSIS — F410 Panic disorder [episodic paroxysmal anxiety] without agoraphobia: Secondary | ICD-10-CM | POA: Diagnosis not present

## 2021-12-08 DIAGNOSIS — F1021 Alcohol dependence, in remission: Secondary | ICD-10-CM

## 2021-12-08 DIAGNOSIS — F0390 Unspecified dementia without behavioral disturbance: Secondary | ICD-10-CM | POA: Diagnosis not present

## 2021-12-08 DIAGNOSIS — G4733 Obstructive sleep apnea (adult) (pediatric): Secondary | ICD-10-CM | POA: Diagnosis not present

## 2021-12-08 DIAGNOSIS — F331 Major depressive disorder, recurrent, moderate: Secondary | ICD-10-CM | POA: Insufficient documentation

## 2021-12-08 MED ORDER — CLONAZEPAM 1 MG PO TABS: 0.5000 mg | ORAL_TABLET | ORAL | 2 refills | Status: DC

## 2021-12-08 NOTE — Progress Notes (Addendum)
BH MD OP Progress Note ? ?12/08/2021 3:03 PM ?Jodi KirschnerJane A De Parrish  ?MRN:  [id number] ? ?Chief Complaint:  ?Chief Complaint  ?Patient presents with  ? Follow-up: 76 year old Caucasian female with history of MDD, panic disorder, neurocognitive disorder, obstructive sleep apnea presented for medication management.  ? ?HPI: Jodi PenJane A De [place] is a 76 year old Caucasian female, married, lives in [place], has a history of bipolar disorder type II, major neurocognitive disorder, panic disorder, MDD, OSA, alcohol use disorder in remission was evaluated in office today. ? ?Patient today appeared to be pleasant with more spontaneous speech, cooperative.  Was able to answer questions mostly in short phrases.  Continues to have memory problems when it comes to remembering activities that she did yesterday or medication names, who her providers are, what changes were made and so on.  Patient reports although she is not depressed she continues to be anxious about her upcoming move to the senior living community. ? ?Patient was able to answer the day, the month, year correctly.  She was able to tell writer's name correctly. ? ?Patient reports she has started using CPAP more frequently.  She reports sleep as good at night. ? ?Collateral information obtained from spouse who reports patient continues to have anxiety, memory changes and needs a lot of support throughout the day.  She continues to have trouble with the CPAP, has not noticed much difference from using it more regularly also. ? ?Reviewed notes per Dr. Kenna GilbertPotter-dated 12/08/2021-" gabapentin was increased to 200 mg 3 times a day.  Also Klonopin was refilled per notes.'  However per spouse they did not get this prescription filled.  Contacted pharmacy and discussed this, according to them Dr. Malvin JohnsPotter has not sent in a prescription for clonazepam at this time. ? ? ? ? ? ? ?Visit Diagnosis:  ?  ICD-10-CM   ?1. Major neurocognitive disorder (HCC)  F03.90   ?  ?2. Panic disorder  F41.0  clonazePAM (KLONOPIN) 1 MG tablet  ?  ?3. Major depressive disorder, recurrent episode with mixed features (HCC)  F33.9   ?  ?4. OSA (obstructive sleep apnea)  G47.33   ?  ?5. Alcohol use disorder, severe, in sustained remission (HCC)  F10.21   ?  ?6. History of bipolar disorder  Z86.59   ?  ? ? ?Past Psychiatric History: Reviewed past psychiatric history from progress note on 04/28/2020.  Past trials of medications ?Celexa ?Prozac ?Zoloft ?Cymbalta ?Abilify ?Lamictal ?Remeron ?Trazodone ?Multiple other medications. ? ?Past Medical History:  ?Past Medical History:  ?Diagnosis Date  ? Ankle fracture   ? right  ? Anxiety   ? Arthritis   ? Bipolar disorder (HCC)   ? Depression   ? Diverticulitis   ? Hyperlipidemia   ? Hypertension   ? Osteoarthritis   ? Overactive bladder   ?  ?Past Surgical History:  ?Procedure Laterality Date  ? ABDOMINAL HYSTERECTOMY  1998  ? BARTHOLIN GLAND CYST EXCISION  1976  ? COLONOSCOPY    ? COLONOSCOPY WITH PROPOFOL N/A 04/10/2018  ? Procedure: COLONOSCOPY WITH PROPOFOL;  Surgeon: Toledo, Boykin Nearingeodoro K, MD;  Location: [facility];  Service: Gastroenterology;  Laterality: N/A;  ? ELBOW SURGERY Right 1995  ? crushed elbow with hardware implanted  ? ESOPHAGOGASTRODUODENOSCOPY (EGD) WITH PROPOFOL N/A 04/10/2018  ? Procedure: ESOPHAGOGASTRODUODENOSCOPY (EGD) WITH PROPOFOL;  Surgeon: Toledo, Boykin Nearingeodoro K, MD;  Location: [facility];  Service: Gastroenterology;  Laterality: N/A;  ? JOINT REPLACEMENT Left 2011  ? knee  ? JOINT REPLACEMENT Right 2009  ?  elbow  ? KNEE SURGERY Left 2000  ? PELVIC FLOOR REPAIR  2009  ? PELVIC FLOOR LIFT  ? REPLACEMENT TOTAL KNEE Left 2016  ? TONSILLECTOMY  1958  ? TOTAL HIP ARTHROPLASTY Left 2008  ? TOTAL HIP ARTHROPLASTY Right 01/10/2017  ? Procedure: TOTAL HIP ARTHROPLASTY ANTERIOR APPROACH;  Surgeon: Kennedy Bucker, MD;  Location: [facility];  Service: Orthopedics;  Laterality: Right;  ? ? ?Family Psychiatric History: Reviewed family psychiatric history from progress note on  04/28/2020. ? ?Family History:  ?Family History  ?Problem Relation Age of Onset  ? Parkinson's disease Mother   ? CAD Father   ? Hypertension Father   ? Anxiety disorder Daughter   ? Depression Daughter   ? Suicidality Daughter   ? Alcohol abuse Daughter   ? Anxiety disorder Daughter   ? Anxiety disorder Son   ? Breast cancer Neg Hx   ? ? ?Social History: Reviewed social history from progress note on 04/28/2020. ?Social History  ? ?Socioeconomic History  ? Marital status: Married  ?  Spouse name: Not on file  ? Number of children: Not on file  ? Years of education: Not on file  ? Highest education level: Not on file  ?Occupational History  ? Not on file  ?Tobacco Use  ? Smoking status: Former  ?  Types: Cigarettes  ? Smokeless tobacco: Never  ?Vaping Use  ? Vaping Use: Never used  ?Substance and Sexual Activity  ? Alcohol use: Not Currently  ?  Comment: HEAVY USE IN THE PAST- QUIT 3 YEARS AGO  ? Drug use: No  ? Sexual activity: Yes  ?Other Topics Concern  ? Not on file  ?Social History Narrative  ? Not on file  ? ?Social Determinants of Health  ? ?Financial Resource Strain: Not on file  ?Food Insecurity: Not on file  ?Transportation Needs: Not on file  ?Physical Activity: Not on file  ?Stress: Not on file  ?Social Connections: Not on file  ? ? ?Allergies:  ?Allergies  ?Allergen Reactions  ? Budesonide-Formoterol Fumarate   ?  Other reaction(s): Unknown  ? Abilify [Aripiprazole] Anxiety  ?  SEVERE HYPERACTIVITY  ? Duloxetine Anxiety  ? ? ?Metabolic Disorder Labs: ?No results found for: HGBA1C, MPG ?No results found for: PROLACTIN ?No results found for: CHOL, TRIG, HDL, CHOLHDL, VLDL, LDLCALC ?Lab Results  ?Component Value Date  ? TSH 0.95 09/18/2013  ? ? ?Therapeutic Level Labs: ?No results found for: LITHIUM ?No results found for: VALPROATE ?No components found for:  CBMZ ? ?Current Medications: ?Current Outpatient Medications  ?Medication Sig Dispense Refill  ? acetaminophen (TYLENOL) 650 MG CR tablet Take 650 mg by  mouth every 8 (eight) hours as needed for pain.    ? Baclofen 5 MG TABS Take 1 tablet by mouth 3 (three) times daily.    ? Cholecalciferol (VITAMIN D-3) 5000 units TABS Take 1 tablet by mouth daily.    ? folic acid (FOLVITE) 800 MCG tablet Take 800 mcg by mouth daily.    ? gabapentin (NEURONTIN) 100 MG capsule Take 200 mg by mouth 3 (three) times daily.    ? magnesium 30 MG tablet Take 250 mg by mouth daily.    ? Multiple Vitamins-Minerals (MULTIVITAMIN WITH MINERALS) tablet Take 1 tablet by mouth daily.    ? polyethylene glycol (MIRALAX / GLYCOLAX) 17 g packet Take 17 g by mouth daily.    ? pravastatin (PRAVACHOL) 40 MG tablet Take 40 mg by mouth at bedtime.    ?  propranolol ER (INDERAL LA) 60 MG 24 hr capsule Take 60 mg by mouth daily. Takes differently    ? QUEtiapine (SEROQUEL) 100 MG tablet TAKE 1 TABLET(100 MG) BY MOUTH AT BEDTIME 30 tablet 2  ? QUEtiapine (SEROQUEL) 25 MG tablet Take half to 1 tablet daily as needed for agitation 30 tablet 2  ? spironolactone (ALDACTONE) 100 MG tablet Take 100 mg by mouth at bedtime.    ? traZODone (DESYREL) 100 MG tablet Take 1 tablet by mouth at bedtime.    ? trimethoprim-polymyxin b (POLYTRIM) ophthalmic solution 1 drop every 4 (four) hours.    ? UNABLE TO FIND 1 Device by Other route as directed. 1 Device O  ? vitamin B-12 (CYANOCOBALAMIN) 1000 MCG tablet Take 1,000 mcg by mouth daily.    ? vortioxetine HBr (TRINTELLIX) 20 MG TABS tablet TAKE 1 TABLET(20 MG) BY MOUTH DAILY 30 tablet 2  ? clonazePAM (KLONOPIN) 1 MG tablet Take 0.5-1 tablets (0.5-1 mg total) by mouth as directed. Take 3-4 times a week only for severe panic attacks 23 tablet 2  ? dicyclomine (BENTYL) 10 MG capsule Take 1 capsule (10 mg total) by mouth 4 (four) times daily for 14 days. 56 capsule 0  ? ?No current facility-administered medications for this visit.  ? ? ? ?Musculoskeletal: ?Strength & Muscle Tone: within normal limits ?Gait & Station:  slow ?Patient leans: N/A ? ?Psychiatric Specialty  Exam: ?Review of Systems  ?Musculoskeletal:  Positive for arthralgias (chronic) and myalgias.  ?Psychiatric/Behavioral:  The patient is nervous/anxious.   ?All other systems reviewed and are negative.  ?Blood pressur

## 2022-01-30 ENCOUNTER — Other Ambulatory Visit: Payer: Self-pay | Admitting: Psychiatry

## 2022-01-30 DIAGNOSIS — F0390 Unspecified dementia without behavioral disturbance: Secondary | ICD-10-CM

## 2022-01-30 DIAGNOSIS — F331 Major depressive disorder, recurrent, moderate: Secondary | ICD-10-CM

## 2022-01-30 DIAGNOSIS — Z8659 Personal history of other mental and behavioral disorders: Secondary | ICD-10-CM

## 2022-01-30 DIAGNOSIS — F410 Panic disorder [episodic paroxysmal anxiety] without agoraphobia: Secondary | ICD-10-CM

## 2022-02-04 ENCOUNTER — Other Ambulatory Visit: Payer: Self-pay | Admitting: Internal Medicine

## 2022-02-04 ENCOUNTER — Other Ambulatory Visit: Payer: Self-pay | Admitting: Psychiatry

## 2022-02-04 DIAGNOSIS — Z8659 Personal history of other mental and behavioral disorders: Secondary | ICD-10-CM

## 2022-02-04 DIAGNOSIS — Z1231 Encounter for screening mammogram for malignant neoplasm of breast: Secondary | ICD-10-CM

## 2022-02-04 DIAGNOSIS — F410 Panic disorder [episodic paroxysmal anxiety] without agoraphobia: Secondary | ICD-10-CM

## 2022-02-04 DIAGNOSIS — F0390 Unspecified dementia without behavioral disturbance: Secondary | ICD-10-CM

## 2022-02-07 ENCOUNTER — Ambulatory Visit
Admission: RE | Admit: 2022-02-07 | Discharge: 2022-02-07 | Disposition: A | Discharge status: Home / Self Care | Payer: Medicare Other | Source: Ambulatory Visit | Attending: Internal Medicine | Admitting: Internal Medicine

## 2022-02-07 DIAGNOSIS — Z1231 Encounter for screening mammogram for malignant neoplasm of breast: Secondary | ICD-10-CM | POA: Insufficient documentation

## 2022-02-16 ENCOUNTER — Encounter: Payer: Self-pay | Admitting: Psychiatry

## 2022-02-16 ENCOUNTER — Telehealth (INDEPENDENT_AMBULATORY_CARE_PROVIDER_SITE_OTHER): Payer: Medicare Other | Admitting: Psychiatry

## 2022-02-16 DIAGNOSIS — F339 Major depressive disorder, recurrent, unspecified: Secondary | ICD-10-CM

## 2022-02-16 DIAGNOSIS — F410 Panic disorder [episodic paroxysmal anxiety] without agoraphobia: Secondary | ICD-10-CM

## 2022-02-16 DIAGNOSIS — F0390 Unspecified dementia without behavioral disturbance: Secondary | ICD-10-CM | POA: Diagnosis not present

## 2022-02-16 DIAGNOSIS — Z8659 Personal history of other mental and behavioral disorders: Secondary | ICD-10-CM

## 2022-02-16 DIAGNOSIS — G4733 Obstructive sleep apnea (adult) (pediatric): Secondary | ICD-10-CM | POA: Diagnosis not present

## 2022-02-16 DIAGNOSIS — F1021 Alcohol dependence, in remission: Secondary | ICD-10-CM

## 2022-02-16 NOTE — Progress Notes (Unsigned)
Virtual Visit via Telephone Note  I connected with Jodi Parrish on 02/16/22 at 10:00 AM EDT by telephone and verified that I am speaking with the correct person using two identifiers.  Location Provider Location : [facility] Patient Location : Home  Participants: Patient ,Spouse, Provider   I discussed the limitations, risks, security and privacy concerns of performing an evaluation and management service by telephone and the availability of in person appointments. I also discussed with the patient that there may be a patient responsible charge related to this service. The patient expressed understanding and agreed to proceed   I discussed the assessment and treatment plan with the patient. The patient was provided an opportunity to ask questions and all were answered. The patient agreed with the plan and demonstrated an understanding of the instructions.   The patient was advised to call back or seek an in-person evaluation if the symptoms worsen or if the condition fails to improve as anticipated.   BH MD OP Progress Note  02/16/2022 10:43 AM Jodi Parrish  MRN:  [id number]  Chief Complaint:  Chief Complaint  Patient presents with   Follow-up: 76 year old Caucasian female with history of MDD, panic disorder, neurocognitive disorder, obstructive sleep apnea presented for medication management.   HPI: Jodi Parrish is a 76 year old Caucasian female, married, lives in [place], has a history of bipolar disorder type II, major neurocognitive disorder, panic disorder, MDD, OSA, alcohol use disorder in remission was evaluated by telephone today.  Patient contacted the clinic prior to her appointment reporting she wanted to change this visit into a phone call.  Patient today appeared to be alert, oriented to person, situation.  Patient continues to have memory loss, immediate memory was fair however recall, recent and remote-poor.  She could not remember what she had or whether she ate her  breakfast this morning.  Needed support from her husband to answer these questions.  Patient reports she is currently struggling with abdominal pain since the past few days, on and off.  Does have nausea.  Her pain is worse after she eats.  Patient has not had this evaluated and agrees to talk to her primary care provider.  Patient does have anxiety about her current health issues.  She however believes overall medications is beneficial.  Would like to stay on her medications.  Denies side effects.  Patient denied any suicidality, homicidality or perceptual disturbances.  Collateral information obtained from spouse-who reported that patient continues to have tiredness, fatigue during the day.  She goes back to bed most of the time in the morning.  She has been trying to use her CPAP mask religiously and however that has not changed her fatigue during the day.  Patient is also compliant on all her medications.  Per spouse he is not sure what could be contributing to the abdominal pain however agrees to talk to her primary care provider.  She likely also has constipation.  Spouse wonders why her memory problems are not getting better in spite of taking all these medications.  Someone mentioned pseudodementia previously and he has heard Trintellix as beneficial for that however has not noticed that much benefit in her from these medications.  Per review of notes from Dr. Bertram Denver 06/17/2020-neuropsychologist " although patient's cognitive issues likely could be due to psychiatric disturbances, medication side effects-she however is demonstrating a consistent pattern of impaired performance on measures that are not to be diagnostic of Alzheimer's clinical syndrome including test such  as naming that are minimally affected by attentional demands.'   This was discussed with spouse again today.    Visit Diagnosis:    ICD-10-CM   1. Major neurocognitive disorder (HCC)  F03.90     2. Panic disorder  F41.0      3. Major depressive disorder, recurrent episode with mixed features (HCC)  F33.9     4. OSA (obstructive sleep apnea)  G47.33     5. Alcohol use disorder, severe, in sustained remission (HCC)  F10.21     6. History of bipolar disorder  Z86.59       Past Psychiatric History: Reviewed past psychiatric history from progress note on 04/28/2020.  Past trials of medications Celexa Prozac Zoloft Cymbalta Abilify Lamictal Remeron Trazodone Multiple other medications.  Past Medical History:  Past Medical History:  Diagnosis Date   Ankle fracture    right   Anxiety    Arthritis    Bipolar disorder (HCC)    Depression    Diverticulitis    Hyperlipidemia    Hypertension    Osteoarthritis    Overactive bladder     Past Surgical History:  Procedure Laterality Date   ABDOMINAL HYSTERECTOMY  1998   BARTHOLIN GLAND CYST EXCISION  1976   COLONOSCOPY     COLONOSCOPY WITH PROPOFOL N/A 04/10/2018   Procedure: COLONOSCOPY WITH PROPOFOL;  Surgeon: Toledo, Boykin Nearingeodoro K, MD;  Location: [facility];  Service: Gastroenterology;  Laterality: N/A;   ELBOW SURGERY Right 1995   crushed elbow with hardware implanted   ESOPHAGOGASTRODUODENOSCOPY (EGD) WITH PROPOFOL N/A 04/10/2018   Procedure: ESOPHAGOGASTRODUODENOSCOPY (EGD) WITH PROPOFOL;  Surgeon: Toledo, Boykin Nearingeodoro K, MD;  Location: [facility];  Service: Gastroenterology;  Laterality: N/A;   JOINT REPLACEMENT Left 2011   knee   JOINT REPLACEMENT Right 2009   elbow   KNEE SURGERY Left 2000   PELVIC FLOOR REPAIR  2009   PELVIC FLOOR LIFT   REPLACEMENT TOTAL KNEE Left 2016   TONSILLECTOMY  1958   TOTAL HIP ARTHROPLASTY Left 2008   TOTAL HIP ARTHROPLASTY Right 01/10/2017   Procedure: TOTAL HIP ARTHROPLASTY ANTERIOR APPROACH;  Surgeon: Kennedy BuckerMichael Menz, MD;  Location: [facility];  Service: Orthopedics;  Laterality: Right;    Family Psychiatric History: Reviewed family psychiatric history from progress note on 04/28/2020.  Family History:   Family History  Problem Relation Age of Onset   Parkinson's disease Mother    CAD Father    Hypertension Father    Anxiety disorder Daughter    Depression Daughter    Suicidality Daughter    Alcohol abuse Daughter    Anxiety disorder Daughter    Anxiety disorder Son    Breast cancer Neg Hx     Social History: Reviewed social history from progress note on 04/28/2020. Social History   Socioeconomic History   Marital status: Married    Spouse name: Not on file   Number of children: Not on file   Years of education: Not on file   Highest education level: Not on file  Occupational History   Not on file  Tobacco Use   Smoking status: Former    Types: Cigarettes   Smokeless tobacco: Never  Vaping Use   Vaping Use: Never used  Substance and Sexual Activity   Alcohol use: Not Currently    Comment: HEAVY USE IN THE PAST- QUIT 3 YEARS AGO   Drug use: No   Sexual activity: Yes  Other Topics Concern   Not on file  Social History  Narrative   Not on file   Social Determinants of Health   Financial Resource Strain: Not on file  Food Insecurity: Not on file  Transportation Needs: Not on file  Physical Activity: Not on file  Stress: Not on file  Social Connections: Not on file    Allergies:  Allergies  Allergen Reactions   Budesonide-Formoterol Fumarate     Other reaction(s): Unknown   Abilify [Aripiprazole] Anxiety    SEVERE HYPERACTIVITY   Duloxetine Anxiety    Metabolic Disorder Labs: No results found for: HGBA1C, MPG No results found for: PROLACTIN No results found for: CHOL, TRIG, HDL, CHOLHDL, VLDL, LDLCALC Lab Results  Component Value Date   TSH 0.95 09/18/2013    Therapeutic Level Labs: No results found for: LITHIUM No results found for: VALPROATE No components found for:  CBMZ  Current Medications: Current Outpatient Medications  Medication Sig Dispense Refill   ciclopirox (PENLAC) 8 % solution APPLY TOPICALLY TO TOENAILS EVERY DAY      fluticasone (FLONASE) 50 MCG/ACT nasal spray Place into the nose.     acetaminophen (TYLENOL) 650 MG CR tablet Take 650 mg by mouth every 8 (eight) hours as needed for pain.     Baclofen 5 MG TABS Take 1 tablet by mouth 3 (three) times daily.     Cholecalciferol (VITAMIN D-3) 5000 units TABS Take 1 tablet by mouth daily.     clonazePAM (KLONOPIN) 1 MG tablet Take 0.5-1 tablets (0.5-1 mg total) by mouth as directed. Take 3-4 times a week only for severe panic attacks 23 tablet 2   dicyclomine (BENTYL) 10 MG capsule Take 1 capsule (10 mg total) by mouth 4 (four) times daily for 14 days. 56 capsule 0   folic acid (FOLVITE) 800 MCG tablet Take 800 mcg by mouth daily.     gabapentin (NEURONTIN) 100 MG capsule Take 200 mg by mouth 3 (three) times daily.     magnesium 30 MG tablet Take 250 mg by mouth daily.     Multiple Vitamins-Minerals (MULTIVITAMIN WITH MINERALS) tablet Take 1 tablet by mouth daily.     polyethylene glycol (MIRALAX / GLYCOLAX) 17 g packet Take 17 g by mouth daily.     pravastatin (PRAVACHOL) 40 MG tablet Take 40 mg by mouth at bedtime.     propranolol ER (INDERAL LA) 60 MG 24 hr capsule Take 60 mg by mouth daily. Takes differently     QUEtiapine (SEROQUEL) 100 MG tablet TAKE 1 TABLET(100 MG) BY MOUTH AT BEDTIME 30 tablet 2   QUEtiapine (SEROQUEL) 25 MG tablet TAKE 1/2 TO 1 TABLET BY MOUTH DAILY AS NEEDED FOR AGITATION 30 tablet 2   spironolactone (ALDACTONE) 100 MG tablet Take 100 mg by mouth at bedtime.     traZODone (DESYREL) 100 MG tablet Take 1 tablet by mouth at bedtime.     trimethoprim-polymyxin b (POLYTRIM) ophthalmic solution 1 drop every 4 (four) hours.     TRINTELLIX 20 MG TABS tablet TAKE 1 TABLET(20 MG) BY MOUTH DAILY 30 tablet 2   UNABLE TO FIND 1 Device by Other route as directed. 1 Device O   vitamin B-12 (CYANOCOBALAMIN) 1000 MCG tablet Take 1,000 mcg by mouth daily.     No current facility-administered medications for this visit.      Musculoskeletal: Strength & Muscle Tone:  UTA Gait & Station:  UTA Patient leans: N/A  Psychiatric Specialty Exam: Review of Systems  Constitutional:  Positive for fatigue.  Gastrointestinal:  Positive for abdominal pain and nausea.  Psychiatric/Behavioral:  The patient is nervous/anxious.   All other systems reviewed and are negative.  There were no vitals taken for this visit.There is no height or weight on file to calculate BMI.  General Appearance:  UTA  Eye Contact:   UTA  Speech:  Normal Rate  Volume:  Normal  Mood:  Anxious  Affect:   UTA  Thought Process:  Goal Directed and Descriptions of Associations: Intact  Orientation:  Other:  self, situation  Thought Content: Logical   Suicidal Thoughts:  No  Homicidal Thoughts:  No  Memory:  Immediate;   Fair Recent;   Poor Remote;   Poor  Judgement:  Fair  Insight:  Fair  Psychomotor Activity:   UTA  Concentration:  Concentration: Fair and Attention Span: Fair  Recall:  [employer] of Knowledge: Fair  Language: Fair  Akathisia:  No  Handed:  Right  AIMS (if indicated): not done  Assets:  Communication Skills Housing Intimacy  ADL's:  Intact  Cognition: Impaired,  Mild  Sleep:  Fair excessive at times   Screenings: AIMS    Flowsheet Row Office Visit from 12/08/2021 in [facility]  AIMS Total Score 0      GAD-7    Flowsheet Row Office Visit from 12/08/2021 in [facility]  Total GAD-7 Score 1      PHQ2-9    Flowsheet Row Office Visit from 12/08/2021 in [facility] Office Visit from 10/20/2021 in [facility] Visit from 07/[facility]21 [facility] Office Visit from 06/01/2021 in [facility] Video Visit from 03/04/2021 in [facility]  PHQ-2 Total Score 1 3 1  0 0  PHQ-9 Total Score -- 14 -- -- 2       Flowsheet Row Counselor from 08/16/2021 in [facility] Counselor from 06/16/2021 in [facility] Counselor from 05/05/2021 in [facility]  C-SSRS RISK CATEGORY No Risk No Risk No Risk        Assessment and Plan: Jodi Parrish is a 76 year old Caucasian female, married, has a history of bipolar disorder type II, anxiety disorder, major neurocognitive disorder, panic disorder, chronic pain was evaluated by phone today.  Patient is currently struggling with abdominal pain which does have an impact on her mood, continues to struggle with memory issues although she is more compliant with CPAP for her OSA.  Discussed plan as noted below.  Plan Major neurocognitive disorder-unstable Patient is currently under the care of neurologist-Dr. 61. Had neuropsychological testing completed 06/26/2020. We will consider repeat testing in the future as needed.  MDD-improving Will consider starting Wellbutrin in the future if she continues to have fatigue during the day. House and she is currently struggling with abdominal pain, nausea-will hold off. Gabapentin 200 mg 3 times a day-per neurology Seroquel 100 mg p.o. nightly Seroquel 25 mg p.o. daily as needed for severe anxiety/agitation. Trintellix 20 mg p.o. daily Klonopin 0.5-1 mg-3-4 times a week for severe panic attacks  Obstructive sleep apnea-unstable Sleep study-03/10/2021-encouraged compliance on CPAP. We will consider referral to a pulmonologist.  Collateral information obtained from spouse as noted above.  Follow-up in clinic in 2 to 3 months or sooner if needed.   I have spent at least 21 minutes non face to face with patient today .   Consent: Patient/Guardian gives verbal consent for treatment and assignment of benefits for services provided during this visit. Patient/Guardian expressed  understanding and agreed to proceed.   This note was  generated in part or whole with voice recognition software. Voice recognition is usually quite accurate but there are transcription errors that can and very often do occur. I apologize for any typographical errors that were not detected and corrected.      Jomarie Longs, MD 02/16/2022, 10:43 AM

## 2022-04-07 ENCOUNTER — Encounter: Payer: Self-pay | Admitting: Psychiatry

## 2022-04-07 ENCOUNTER — Ambulatory Visit (INDEPENDENT_AMBULATORY_CARE_PROVIDER_SITE_OTHER): Payer: Medicare Other | Admitting: Psychiatry

## 2022-04-07 ENCOUNTER — Other Ambulatory Visit: Payer: Self-pay | Admitting: Psychiatry

## 2022-04-07 VITALS — BP 133/82 | HR 77 | Temp 98.5°F | Wt 200.0 lb

## 2022-04-07 DIAGNOSIS — F1021 Alcohol dependence, in remission: Secondary | ICD-10-CM

## 2022-04-07 DIAGNOSIS — Z8659 Personal history of other mental and behavioral disorders: Secondary | ICD-10-CM

## 2022-04-07 DIAGNOSIS — F410 Panic disorder [episodic paroxysmal anxiety] without agoraphobia: Secondary | ICD-10-CM | POA: Diagnosis not present

## 2022-04-07 DIAGNOSIS — F0390 Unspecified dementia without behavioral disturbance: Secondary | ICD-10-CM | POA: Diagnosis not present

## 2022-04-07 DIAGNOSIS — F339 Major depressive disorder, recurrent, unspecified: Secondary | ICD-10-CM | POA: Diagnosis not present

## 2022-04-07 DIAGNOSIS — F331 Major depressive disorder, recurrent, moderate: Secondary | ICD-10-CM

## 2022-04-07 DIAGNOSIS — G4733 Obstructive sleep apnea (adult) (pediatric): Secondary | ICD-10-CM | POA: Diagnosis not present

## 2022-04-07 MED ORDER — CLONAZEPAM 1 MG PO TABS: 0.5000 mg | ORAL_TABLET | ORAL | 2 refills | Status: DC

## 2022-04-07 NOTE — Progress Notes (Signed)
BH MD OP Progress Note  04/07/2022 3:33 PM Jodi Parrish  MRN:  [id number]  Chief Complaint:  Chief Complaint  Patient presents with   Follow-up: 76 year old Caucasian female with history of MDD, panic disorder, neurocognitive disorder, obstructive sleep apnea, presented for medication management.   HPI: Jodi Parrish is a 76 year old female, married, lives in bedside, has a history of bipolar disorder type II, major neurocognitive disorder, panic disorder, MDD, OSA, alcohol use disorder in remission was evaluated in office today.  Patient as well as spouse-Steve participated in the evaluation.  Patient today appeared to be alert, pleasant, smiling often.  Patient was able to communicate better than previous visits, did not have any thought blocking in session today.  She did not appear to be anxious.  Patient however continues to have significant memory problems.  Patient could not give details about what happened in the past week with regards to her doctor's appointments, could not give any information about her appointment with Dr. Chaney Malling , did not even remember that she had an appointment with the provider.  Could not give any details about what treatment she was provided for her pain.  Patient could not give any details about the medications that she takes.  All of what she does during the day.  Patient hence continues to have significant memory issues.  Patient had an appointment with Dr.Potter-neurology-reviewed notes per neurologist-03/15/2022-at that visit patient was advised to increase the Seroquel to 150 mg at bedtime, Klonopin was increased to 0.5 mg twice a day and an additional third tablet as needed.  Per collateral information obtained from spouse-patient tried making the change with the Seroquel only at bedtime and not during the day to address her excessive sleepiness and tiredness during the day.  However he does not believe that has made a difference.  He also has been  giving her half tablet of Klonopin in the afternoon instead of morning and the other half at bedtime.  He is interested in tapering off the Klonopin and cutting back more on the Seroquel since she continues to lay in bed throughout the day.  He believes she has been sleeping okay at night.  Has been compliant with the CPAP.  However that does not seem to help with her energy during the day.  He is also worried about her memory problems.  She does not feel motivated to do anything although she does not seem to be depressed or anxious and her mood has improved.  They were able to spend some time with their daughter and grandchildren-went camping recently.  That did help a lot.  He is worried about her weight gain since she does not seem to follow any schedule with her eating habits, and instead of having meals few times a day she snacks, on unhealthy food.  He is also worried that they are still waiting for [facility], to call them, eager to move in as soon as possible.  Patient denies any suicidality, homicidality or perceptual disturbances.  Patient denies any other concerns today.    Visit Diagnosis:    ICD-10-CM   1. Major neurocognitive disorder (HCC)  F03.90     2. Panic disorder  F41.0 clonazePAM (KLONOPIN) 1 MG tablet    3. Major depressive disorder, recurrent episode with mixed features (HCC)  F33.9     4. OSA (obstructive sleep apnea)  G47.33     5. Alcohol use disorder, severe, in sustained remission (HCC)  F10.21  6. History of bipolar disorder  Z86.59       Past Psychiatric History: Reviewed past psychiatric history from progress note on 04/28/2020.  Past trials of medications-Celexa, Prozac, Zoloft, Cymbalta, Abilify, Lamictal, Remeron, trazodone-multiple other medications.  Past Medical History:  Past Medical History:  Diagnosis Date   Ankle fracture    right   Anxiety    Arthritis    Bipolar disorder (HCC)    Depression    Diverticulitis     Hyperlipidemia    Hypertension    Osteoarthritis    Overactive bladder     Past Surgical History:  Procedure Laterality Date   ABDOMINAL HYSTERECTOMY  1998   BARTHOLIN GLAND CYST EXCISION  1976   COLONOSCOPY     COLONOSCOPY WITH PROPOFOL N/A 04/10/2018   Procedure: COLONOSCOPY WITH PROPOFOL;  Surgeon: Toledo, Boykin Nearing, MD;  Location: [facility];  Service: Gastroenterology;  Laterality: N/A;   ELBOW SURGERY Right 1995   crushed elbow with hardware implanted   ESOPHAGOGASTRODUODENOSCOPY (EGD) WITH PROPOFOL N/A 04/10/2018   Procedure: ESOPHAGOGASTRODUODENOSCOPY (EGD) WITH PROPOFOL;  Surgeon: Toledo, Boykin Nearing, MD;  Location: [facility];  Service: Gastroenterology;  Laterality: N/A;   JOINT REPLACEMENT Left 2011   knee   JOINT REPLACEMENT Right 2009   elbow   KNEE SURGERY Left 2000   PELVIC FLOOR REPAIR  2009   PELVIC FLOOR LIFT   REPLACEMENT TOTAL KNEE Left 2016   TONSILLECTOMY  1958   TOTAL HIP ARTHROPLASTY Left 2008   TOTAL HIP ARTHROPLASTY Right 01/10/2017   Procedure: TOTAL HIP ARTHROPLASTY ANTERIOR APPROACH;  Surgeon: Kennedy Bucker, MD;  Location: [facility] ORS;  Service: Orthopedics;  Laterality: Right;    Family Psychiatric History: Reviewed family psychiatric history from progress note on 04/28/2020.  Family History:  Family History  Problem Relation Age of Onset   Parkinson's disease Mother    CAD Father    Hypertension Father    Anxiety disorder Daughter    Depression Daughter    Suicidality Daughter    Alcohol abuse Daughter    Anxiety disorder Daughter    Anxiety disorder Son    Breast cancer Neg Hx     Social History: Reviewed social history from progress note on 04/28/2020. Social History   Socioeconomic History   Marital status: Married    Spouse name: Not on file   Number of children: Not on file   Years of education: Not on file   Highest education level: Not on file  Occupational History   Not on file  Tobacco Use   Smoking status: Former     Types: Cigarettes   Smokeless tobacco: Never  Vaping Use   Vaping Use: Never used  Substance and Sexual Activity   Alcohol use: Not Currently    Comment: HEAVY USE IN THE PAST- QUIT 3 YEARS AGO   Drug use: No   Sexual activity: Yes  Other Topics Concern   Not on file  Social History Narrative   Not on file   Social Determinants of Health   Financial Resource Strain: Not on file  Food Insecurity: Not on file  Transportation Needs: Not on file  Physical Activity: Not on file  Stress: Not on file  Social Connections: Not on file    Allergies:  Allergies  Allergen Reactions   Budesonide-Formoterol Fumarate     Other reaction(s): Unknown   Abilify [Aripiprazole] Anxiety    SEVERE HYPERACTIVITY   Duloxetine Anxiety    Metabolic Disorder Labs: No results found for: "  HGBA1C", "MPG" No results found for: "PROLACTIN" No results found for: "CHOL", "TRIG", "HDL", "CHOLHDL", "VLDL", "LDLCALC" Lab Results  Component Value Date   TSH 0.95 09/18/2013    Therapeutic Level Labs: No results found for: "LITHIUM" No results found for: "VALPROATE" No results found for: "CBMZ"  Current Medications: Current Outpatient Medications  Medication Sig Dispense Refill   acetaminophen (TYLENOL) 650 MG CR tablet Take 650 mg by mouth every 8 (eight) hours as needed for pain.     Cholecalciferol (VITAMIN D-3) 5000 units TABS Take 1 tablet by mouth daily.     ciclopirox (PENLAC) 8 % solution APPLY TOPICALLY TO TOENAILS EVERY DAY     dicyclomine (BENTYL) 10 MG capsule Take 1 capsule (10 mg total) by mouth 4 (four) times daily for 14 days. 56 capsule 0   fluticasone (FLONASE) 50 MCG/ACT nasal spray Place into the nose.     folic acid (FOLVITE) 800 MCG tablet Take 800 mcg by mouth daily.     gabapentin (NEURONTIN) 100 MG capsule Take 200 mg by mouth 3 (three) times daily.     magnesium 30 MG tablet Take 250 mg by mouth daily.     Multiple Vitamins-Minerals (MULTIVITAMIN WITH MINERALS) tablet Take  1 tablet by mouth daily.     polyethylene glycol (MIRALAX / GLYCOLAX) 17 g packet Take 17 g by mouth daily.     pravastatin (PRAVACHOL) 40 MG tablet Take 40 mg by mouth at bedtime.     propranolol ER (INDERAL LA) 60 MG 24 hr capsule Take 60 mg by mouth daily. Takes differently     QUEtiapine (SEROQUEL) 100 MG tablet TAKE 1 TABLET(100 MG) BY MOUTH AT BEDTIME 30 tablet 2   Simethicone (GAS-X EXTRA STRENGTH) 125 MG CAPS Take 125 mg by mouth. Twice a day as needed     traZODone (DESYREL) 100 MG tablet Take 1 tablet by mouth at bedtime.     trimethoprim-polymyxin b (POLYTRIM) ophthalmic solution 1 drop every 4 (four) hours.     TRINTELLIX 20 MG TABS tablet TAKE 1 TABLET(20 MG) BY MOUTH DAILY 30 tablet 2   UNABLE TO FIND 1 Device by Other route as directed. 1 Device O   vitamin B-12 (CYANOCOBALAMIN) 1000 MCG tablet Take 1,000 mcg by mouth daily.     Baclofen 5 MG TABS Take 1 tablet by mouth 3 (three) times daily. (Patient not taking: Reported on 04/07/2022)     clonazePAM (KLONOPIN) 1 MG tablet Take 0.5 tablets (0.5 mg total) by mouth as directed. Take 0.5 mg daily AM and 0.5 mg ( every other day and taper off bedtime dose ) 23 tablet 2   No current facility-administered medications for this visit.     Musculoskeletal: Strength & Muscle Tone: within normal limits Gait & Station: normal Patient leans: N/A  Psychiatric Specialty Exam: Review of Systems  Unable to perform ROS: Psychiatric disorder    Blood pressure 133/82, pulse 77, temperature 98.5 F (36.9 C), temperature source Temporal, weight 200 lb (90.7 kg).Body mass index is 30.41 kg/m.  General Appearance: Casual  Eye Contact:  Fair  Speech:  Clear and Coherent  Volume:  Normal  Mood:  Euthymic  Affect:  Full Range, smiling often  Thought Process:  Linear and Descriptions of Associations: Intact  Orientation:  Other:  situation, person, place  Thought Content: Logical   Suicidal Thoughts:  No  Homicidal Thoughts:  No  Memory:   Immediate;   Fair Recent;   Poor Remote;   Poor  Judgement:  Fair  Insight:  Shallow  Psychomotor Activity:  Normal  Concentration:  Concentration: Fair and Attention Span: Fair  Recall:  Poor  Fund of Knowledge: Fair  Language: Fair  Akathisia:  No  Handed:  Right  AIMS (if indicated): done  Assets:  Communication Skills Desire for Improvement Housing Social Support  ADL's:  Intact  Cognition: WNL  Sleep:   Excessive   Screenings: AIMS    Flowsheet Row Office Visit from 04/07/2022 in [facility] Office Visit from 12/08/2021 in [facility]  AIMS Total Score 0 0      GAD-7    Flowsheet Row Office Visit from 12/08/2021 in [facility]  Total GAD-7 Score 1      PHQ2-9    Flowsheet Row Office Visit from 12/08/2021 in [facility] Office Visit from 10/20/2021 in [facility] Office Visit from 07/27/2021 in [facility] Office Visit from 06/01/2021 in [facility] Video Visit from 03/04/2021 in [facility]  PHQ-2 Total Score 1 3 1  0 0  PHQ-9 Total Score -- 14 -- -- 2      Flowsheet Row Office Visit from 04/07/2022 in [facility] Counselor from 08/16/2021 in [facility] Counselor from 06/16/2021 in [facility]  C-SSRS RISK CATEGORY No Risk No Risk No Risk        Assessment and Plan: Jodi Parrish is a 76 year old Caucasian female, married, has a history of bipolar disorder type II, anxiety disorder, major neurocognitive disorder, panic disorder, chronic pain was evaluated in office today.  Patient is currently struggling with memory problems, excessive sleepiness likely due to being on polypharmacy, will benefit from the following plan.  Plan  Major neurocognitive  disorder-unstable Patient to continue follow-up with Dr. 61 Neuropsychological testing completed 06/26/2020-could repeat testing in the future.  MDD-unstable Consider starting Wellbutrin in the future. Continue gabapentin 200 mg 3 times a day per neurology Reduce Seroquel to 100 mg p.o. nightly.  To address excessive sleepiness during the day. Trintellix 20 mg p.o. daily Reduce Klonopin to 0.5 mg daily in the afternoon, 0.5 mg every other night for the next 10 days and then use it as needed. Reviewed [place] PMP aware  Panic disorder-improving Continue medications as noted above.  She is on Klonopin as noted above. Was referred for psychotherapy in the past.  Obstructive sleep apnea-unstable Sleep study-03/10/2021-patient is currently compliant on CPAP Will consider referral to pulmonology. Discussed keeping a sleep log.  Alcohol use disorder in remission Will monitor closely  Collateral information obtained from spouse as noted above.  Collateral information obtained from Dr. 03/12/2021 notes-dated 03/15/2022.  As noted above.  Discussed lifestyle modification, getting involved in activities, going out for walks, getting into an exercise program.  Patient with weight gain of around 20 pounds or more in the past 3 to 4 months, likely multifactorial.  Discussed keeping a food log.  Follow-up in clinic in 6 weeks or sooner if needed.  This note was generated in part or whole with voice recognition software. Voice recognition is usually quite accurate but there are transcription errors that can and very often do occur. I apologize for any typographical errors that were not detected and corrected.     03/17/2022, MD 04/07/2022, 3:33 PM

## 2022-04-07 NOTE — Patient Instructions (Signed)
CHANGE SEROQUEL TO 100 MG AT BEDTIME.  STOP SEROQUEL 25 MG.  CHANGE KLONOPIN TO 0.5 MG DAILY EVERY DAY AFTERNOON  CHANGE KLONOPIN TO 0.5 MG AT BEDTIME TO EVERY OTHER NIGHT FOR 10 DAYS AND THEN TAKE IT AS NEEDED AT NIGHT.  KEEP A FOOD LOG .  KEEP A SLEEP LOG .

## 2022-04-20 ENCOUNTER — Other Ambulatory Visit: Payer: Self-pay | Admitting: Family Medicine

## 2022-04-20 DIAGNOSIS — M5416 Radiculopathy, lumbar region: Secondary | ICD-10-CM

## 2022-04-29 ENCOUNTER — Other Ambulatory Visit: Payer: Self-pay | Admitting: Psychiatry

## 2022-04-29 DIAGNOSIS — F0390 Unspecified dementia without behavioral disturbance: Secondary | ICD-10-CM

## 2022-04-29 DIAGNOSIS — F410 Panic disorder [episodic paroxysmal anxiety] without agoraphobia: Secondary | ICD-10-CM

## 2022-04-29 DIAGNOSIS — Z8659 Personal history of other mental and behavioral disorders: Secondary | ICD-10-CM

## 2022-05-03 ENCOUNTER — Other Ambulatory Visit: Payer: Self-pay | Admitting: Psychiatry

## 2022-05-03 DIAGNOSIS — F410 Panic disorder [episodic paroxysmal anxiety] without agoraphobia: Secondary | ICD-10-CM

## 2022-05-04 ENCOUNTER — Ambulatory Visit
Admission: RE | Admit: 2022-05-04 | Discharge: 2022-05-04 | Disposition: A | Discharge status: Home / Self Care | Payer: Medicare Other | Source: Ambulatory Visit | Attending: Family Medicine | Admitting: Family Medicine

## 2022-05-04 DIAGNOSIS — M5416 Radiculopathy, lumbar region: Secondary | ICD-10-CM

## 2022-05-19 ENCOUNTER — Ambulatory Visit (INDEPENDENT_AMBULATORY_CARE_PROVIDER_SITE_OTHER): Payer: Medicare Other | Admitting: Psychiatry

## 2022-05-19 ENCOUNTER — Encounter: Payer: Self-pay | Admitting: Psychiatry

## 2022-05-19 VITALS — BP 136/87 | HR 78 | Temp 98.3°F | Wt 196.4 lb

## 2022-05-19 DIAGNOSIS — Z79899 Other long term (current) drug therapy: Secondary | ICD-10-CM

## 2022-05-19 DIAGNOSIS — F410 Panic disorder [episodic paroxysmal anxiety] without agoraphobia: Secondary | ICD-10-CM | POA: Diagnosis not present

## 2022-05-19 DIAGNOSIS — G4733 Obstructive sleep apnea (adult) (pediatric): Secondary | ICD-10-CM | POA: Diagnosis not present

## 2022-05-19 DIAGNOSIS — F0390 Unspecified dementia without behavioral disturbance: Secondary | ICD-10-CM

## 2022-05-19 DIAGNOSIS — F1021 Alcohol dependence, in remission: Secondary | ICD-10-CM

## 2022-05-19 DIAGNOSIS — F339 Major depressive disorder, recurrent, unspecified: Secondary | ICD-10-CM

## 2022-05-19 DIAGNOSIS — Z9189 Other specified personal risk factors, not elsewhere classified: Secondary | ICD-10-CM

## 2022-05-19 NOTE — Patient Instructions (Signed)
Please call for EKG-[id number] 

## 2022-05-19 NOTE — Progress Notes (Signed)
[facility] MD OP Progress Note  05/19/2022 7:20 PM Jodi Parrish  MRN:  [id number]  Chief Complaint:  Chief Complaint  Patient presents with   Follow-up: 76 year old Caucasian female with history of MDD, neurocognitive disorder, obstructive sleep apnea, panic disorder, presented for medication management.   HPI: Jodi Parrish is a 76 year old female, married, lives in [place], has a history of bipolar disorder type II, major neurocognitive disorder, panic disorder, MDD, OSA, alcohol use disorder in remission was evaluated in office today.  Patient as well as spouse-Jodi Parrish participated in the evaluation today.  Jodi Parrish provided collateral information.  Patient continues to be a limited historian mostly because of her memory issues ,neurocognitive problems.  Patient however appeared to be pleasant, cooperative in session today.  Patient unable to give much information about how she has been doing since her last visit with writer on 04/07/2022.  Husband provided collateral information reporting that patient continues to sleep a lot during the day.  She has been compliant with CPAP.  She goes to bed at around 11:30 PM.  Stays in bed till 9:30 AM the next day.  When she wakes up and have her coffee and takes her medication she goes back to bed again and sleeps for the next few hours.  She takes naps throughout the day.  Her Seroquel dosage was reduced as well as she does not take Seroquel during the day anymore.  Klonopin dosage was also reduced last visit.  However in spite of that there has been no change in her ability to stay awake during the day.  Patient also continues to have significant problems with her memory when it comes to talking about her appointments, remembering the day the month, year, giving information about certain timeline and so on.  In spite of having multiple antidepressant medication readjustment she has not made any progress with that.  Unknown if she is getting worse with that.  Patient  reports appetite is fair.  Denies any suicidality, homicidality or perceptual disturbances.  Denies any side effects to medications.  They continue to wait to get into [facility].  They did receive a letter yesterday that they could possibly get in in the next couple of months.  Visit Diagnosis:    ICD-10-CM   1. Major neurocognitive disorder ([place])  F03.90     2. Panic disorder  F41.0     3. Major depressive disorder, recurrent episode with mixed features (HCC)  F33.9     4. OSA (obstructive sleep apnea)  G47.33     5. Alcohol use disorder, severe, in sustained remission ([place])  F10.21     6. High risk medication use  Z79.899 EKG 12-Lead    7. At risk for prolonged QT interval syndrome  Z91.89 EKG 12-Lead      Past Psychiatric History: Reviewed past psychiatric history from progress note on 04/28/2020.  Past trials of medications-Celexa, Prozac, Zoloft, Cymbalta, Abilify, Lamictal, Remeron, trazodone-multiple other medications.  Past Medical History:  Past Medical History:  Diagnosis Date   Ankle fracture    right   Anxiety    Arthritis    Bipolar disorder ([place])    Depression    Diverticulitis    Hyperlipidemia    Hypertension    Osteoarthritis    Overactive bladder     Past Surgical History:  Procedure Laterality Date   ABDOMINAL HYSTERECTOMY  1998   BARTHOLIN GLAND CYST EXCISION  1976   COLONOSCOPY     COLONOSCOPY  WITH PROPOFOL N/A 04/10/2018   Procedure: COLONOSCOPY WITH PROPOFOL;  Surgeon: Toledo, Boykin Nearing, MD;  Location: [facility];  Service: Gastroenterology;  Laterality: N/A;   ELBOW SURGERY Right 1995   crushed elbow with hardware implanted   ESOPHAGOGASTRODUODENOSCOPY (EGD) WITH PROPOFOL N/A 04/10/2018   Procedure: ESOPHAGOGASTRODUODENOSCOPY (EGD) WITH PROPOFOL;  Surgeon: Toledo, Boykin Nearing, MD;  Location: [facility];  Service: Gastroenterology;  Laterality: N/A;   JOINT REPLACEMENT Left 2011   knee   JOINT REPLACEMENT Right  2009   elbow   KNEE SURGERY Left 2000   PELVIC FLOOR REPAIR  2009   PELVIC FLOOR LIFT   REPLACEMENT TOTAL KNEE Left 2016   TONSILLECTOMY  1958   TOTAL HIP ARTHROPLASTY Left 2008   TOTAL HIP ARTHROPLASTY Right 01/10/2017   Procedure: TOTAL HIP ARTHROPLASTY ANTERIOR APPROACH;  Surgeon: Kennedy Bucker, MD;  Location: [facility];  Service: Orthopedics;  Laterality: Right;    Family Psychiatric History: Reviewed family psychiatric history from progress note on 04/28/2020.  Family History:  Family History  Problem Relation Age of Onset   Parkinson's disease Mother    CAD Father    Hypertension Father    Anxiety disorder Daughter    Depression Daughter    Suicidality Daughter    Alcohol abuse Daughter    Anxiety disorder Daughter    Anxiety disorder Son    Breast cancer Neg Hx     Social History: Reviewed social history from progress note on 04/28/2020. Social History   Socioeconomic History   Marital status: Married    Spouse name: Not on file   Number of children: Not on file   Years of education: Not on file   Highest education level: Not on file  Occupational History   Not on file  Tobacco Use   Smoking status: Former    Types: Cigarettes   Smokeless tobacco: Never  Vaping Use   Vaping Use: Never used  Substance and Sexual Activity   Alcohol use: Not Currently    Comment: HEAVY USE IN THE PAST- QUIT 3 YEARS AGO   Drug use: No   Sexual activity: Yes  Other Topics Concern   Not on file  Social History Narrative   Not on file   Social Determinants of Health   Financial Resource Strain: Not on file  Food Insecurity: Not on file  Transportation Needs: Not on file  Physical Activity: Not on file  Stress: Not on file  Social Connections: Not on file    Allergies:  Allergies  Allergen Reactions   Budesonide-Formoterol Fumarate     Other reaction(s): Unknown   Abilify [Aripiprazole] Anxiety    SEVERE HYPERACTIVITY   Duloxetine Anxiety    Metabolic Disorder  Labs: No results found for: "HGBA1C", "MPG" No results found for: "PROLACTIN" No results found for: "CHOL", "TRIG", "HDL", "CHOLHDL", "VLDL", "LDLCALC" Lab Results  Component Value Date   TSH 0.95 09/18/2013    Therapeutic Level Labs: No results found for: "LITHIUM" No results found for: "VALPROATE" No results found for: "CBMZ"  Current Medications: Current Outpatient Medications  Medication Sig Dispense Refill   acetaminophen (TYLENOL) 650 MG CR tablet Take 650 mg by mouth every 8 (eight) hours as needed for pain.     Cholecalciferol (VITAMIN D-3) 5000 units TABS Take 1 tablet by mouth daily.     clonazePAM (KLONOPIN) 1 MG tablet Take 0.5 tablets (0.5 mg total) by mouth as directed. Take 0.5 mg daily AM and 0.5 mg ( every other day and  taper off bedtime dose ) 23 tablet 2   folic acid (FOLVITE) Q000111Q MCG tablet Take 400 mcg by mouth daily.     gabapentin (NEURONTIN) 100 MG capsule Take 200 mg by mouth 3 (three) times daily.     magnesium 30 MG tablet Take 250 mg by mouth daily.     Multiple Vitamins-Minerals (MULTIVITAMIN WITH MINERALS) tablet Take 1 tablet by mouth daily.     polyethylene glycol (MIRALAX / GLYCOLAX) 17 g packet Take 17 g by mouth daily.     pravastatin (PRAVACHOL) 40 MG tablet Take 40 mg by mouth at bedtime.     propranolol ER (INDERAL LA) 60 MG 24 hr capsule Take 60 mg by mouth daily. Takes differently     QUEtiapine (SEROQUEL) 100 MG tablet TAKE 1 TABLET(100 MG) BY MOUTH AT BEDTIME 30 tablet 2   Simethicone (GAS-X EXTRA STRENGTH) 125 MG CAPS Take 125 mg by mouth. Twice a day as needed     traZODone (DESYREL) 100 MG tablet Take 1 tablet by mouth at bedtime.     trimethoprim-polymyxin b (POLYTRIM) ophthalmic solution 1 drop every 4 (four) hours.     TRINTELLIX 20 MG TABS tablet TAKE 1 TABLET(20 MG) BY MOUTH DAILY 30 tablet 2   UNABLE TO FIND 1 Device by Other route as directed. 1 Device O   vitamin B-12 (CYANOCOBALAMIN) 1000 MCG tablet Take 1,000 mcg by mouth daily.      Baclofen 5 MG TABS Take 1 tablet by mouth 3 (three) times daily. (Patient not taking: Reported on 05/19/2022)     ciclopirox (PENLAC) 8 % solution APPLY TOPICALLY TO TOENAILS EVERY DAY (Patient not taking: Reported on 05/19/2022)     fluticasone (FLONASE) 50 MCG/ACT nasal spray Place into the nose. (Patient not taking: Reported on 05/19/2022)     No current facility-administered medications for this visit.     Musculoskeletal: Strength & Muscle Tone: within normal limits Gait & Station: normal Patient leans: N/A  Psychiatric Specialty Exam: Review of Systems  Unable to perform ROS: Other (memory problems)    Blood pressure 136/87, pulse 78, temperature 98.3 F (36.8 C), temperature source Temporal, weight 196 lb 6.4 oz (89.1 kg).Body mass index is 29.86 kg/m.  General Appearance: Casual  Eye Contact:  Fair  Speech:  Clear and Coherent  Volume:  Normal  Mood:   Patient unable to verbalize how her mood is however per report from spouse continues to be unmotivated has anhedonia and excessive sleep during the day.  Affect:  Full Range  Thought Process:  Goal Directed and Descriptions of Associations: Intact  Orientation:  Other:  Person place situation  Thought Content: Logical   Suicidal Thoughts:  No  Homicidal Thoughts:  No  Memory:  Immediate;   Fair Recent;   Poor Remote;   Poor  Judgement:  Fair  Insight:  Shallow  Psychomotor Activity:  Normal  Concentration:  Concentration: Fair and Attention Span: Fair  Recall:  Poor  Fund of Knowledge: Fair  Language: Fair  Akathisia:  No  Handed:  Right  AIMS (if indicated): done  Assets:  Communication Skills Desire for [place] Talents/Skills Transportation  ADL's:  Intact  Cognition: Impaired,  Mild  Sleep:   Excessive during the day   Screenings: [place] Visit from 05/19/2022 in [facility] Office Visit from 04/07/2022 in [facility] Office Visit from 12/08/2021 in [place] [facility]  Total Score 0 0 0      GAD-7    Flowsheet Row Office Visit from 12/08/2021 in [facility]  Total GAD-7 Score 1      PHQ2-9    Flowsheet Row Office Visit from 05/19/2022 in [facility] Office Visit from 12/08/2021 in [facility] Office Visit from 10/20/2021 in [facility] Office Visit from 07/27/2021 in [facility] Office Visit from 06/01/2021 in [facility]  PHQ-2 Total Score 4 1 3 1  0  PHQ-9 Total Score 16 -- 14 -- --      Flowsheet Row Office Visit from 05/19/2022 in [facility] Office Visit from 04/07/2022 in [facility] Counselor from 08/16/2021 in [facility]  C-SSRS RISK CATEGORY No Risk No Risk No Risk        Assessment and Plan: AMMIE WARRICK Parrish is a 76 year old Caucasian female, married, has a history of bipolar disorder type II, anxiety disorder, major neurocognitive disorder, panic disorder, chronic pain, obstructive sleep apnea on CPAP was evaluated in office today.  Patient is currently struggling with depression, excessive sleepiness during the day, memory problems, currently on polypharmacy, continues to not have any significant improvement, will benefit from following plan.  Plan Major neurocognitive disorder-unstable Patient had last neuropsychological testing-06/26/2020.  Will repeat again in 6 months.  Will refer to Dr. 08/26/2020. Continue follow-up with Dr. Kieth Brightly.  GAD-unstable We will consider starting low dosage of psychostimulant, Ritalin however will get an EKG prior to that. Will reduce the dosage of Trintellix if starting Ritalin. Gabapentin 200 mg p.o. 3 times daily Seroquel reduced dosage of 100  mg p.o. nightly.  Dosage was reduced to address excessive sleepiness.  However that does not seem to have helped with the sleepiness. Continue Trintellix 20 mg p.o. daily for now Klonopin at reduced dosage of 0.5 mg daily in the afternoon, 0.5 mg at night.  Patient could use this as needed and try to wean off. Reviewed [place] PMP aware.  Panic disorder-improving Continue Trintellix, clonazepam as noted above.  Obstructive sleep apnea-unstable Sleep study-03/10/2021-currently compliant on CPAP We will consider referral to pulmonology as needed in the future.  High risk medication use-at risk for prolonged QT syndrome-we will order EKG.  Once I review the EKG we will consider starting patient on Ritalin.  Provided medication education.  Discussed effect of Ritalin including its effect on blood pressure, heart.  Advised to monitor blood pressure, keep a log if starting Ritalin.    Collateral information obtained from spouse as noted above.  Follow-up in clinic in 3 to 4 weeks or sooner if needed.   This note was generated in part or whole with voice recognition software. Voice recognition is usually quite accurate but there are transcription errors that can and very often do occur. I apologize for any typographical errors that were not detected and corrected.      03/12/2021, MD 05/19/2022, 7:20 PM

## 2022-05-20 ENCOUNTER — Telehealth: Payer: Self-pay | Admitting: Psychiatry

## 2022-05-20 NOTE — Telephone Encounter (Signed)
Spouse stopped by office. Stated went to get the EKG done but there was no order. Please advise

## 2022-05-20 NOTE — Telephone Encounter (Signed)
I contacted the scheduling department for EKG, spoke to the staff-they were able to find the order for EKG from yesterday.  They have already left a message for patient to contact them back.

## 2022-05-20 NOTE — Telephone Encounter (Signed)
Attempted to contact patient as well as spouse.  An EKG order was placed yesterday and it is in the system.  Not sure why patient cannot get the EKG done.  I have also tried reaching scheduling department at [phone] to leave a voicemail.

## 2022-05-20 NOTE — Telephone Encounter (Signed)
Noted, there is an EKG ordered in the system.  Not sure why they could not get it done.  I have left a voicemail for patient.

## 2022-05-24 NOTE — Telephone Encounter (Signed)
gave patient the phone number to registration to set up appt to have EKG done.

## 2022-05-26 ENCOUNTER — Encounter: Payer: Self-pay | Admitting: Psychology

## 2022-05-27 ENCOUNTER — Ambulatory Visit
Admission: RE | Admit: 2022-05-27 | Discharge: 2022-05-27 | Disposition: A | Discharge status: Home / Self Care | Payer: Medicare Other | Source: Ambulatory Visit | Attending: Psychiatry | Admitting: Psychiatry

## 2022-05-27 ENCOUNTER — Telehealth: Payer: Self-pay | Admitting: Psychiatry

## 2022-05-27 DIAGNOSIS — Z9189 Other specified personal risk factors, not elsewhere classified: Secondary | ICD-10-CM | POA: Diagnosis not present

## 2022-05-27 DIAGNOSIS — Z79899 Other long term (current) drug therapy: Secondary | ICD-10-CM | POA: Diagnosis present

## 2022-05-27 DIAGNOSIS — F339 Major depressive disorder, recurrent, unspecified: Secondary | ICD-10-CM

## 2022-05-27 MED ORDER — VORTIOXETINE HBR 10 MG PO TABS: 10.0000 mg | ORAL_TABLET | Freq: Every day | ORAL | 1 refills | Status: DC

## 2022-05-27 MED ORDER — METHYLPHENIDATE HCL 5 MG PO TABS: 5.0000 mg | ORAL_TABLET | Freq: Every morning | ORAL | 0 refills | Status: DC

## 2022-05-27 NOTE — Telephone Encounter (Signed)
Attempted to contact patient, spoke to husband.  Discussed that as per our discussion at last visit will reduce Trintellix to 10 mg daily and add Ritalin 5 mg p.o. daily in the morning for depression, cognitive issues. Provided medication education including serious side effects, drug to drug interaction, serotonin syndrome, effect on heart, elevated blood pressure and so on.  Advised to get immediate help if patient has any serious side effects.  I have sent a prescription to the pharmacy [facility].  Reviewed [place] PMP aware.

## 2022-05-29 ENCOUNTER — Other Ambulatory Visit: Payer: Self-pay | Admitting: Psychiatry

## 2022-05-29 DIAGNOSIS — F410 Panic disorder [episodic paroxysmal anxiety] without agoraphobia: Secondary | ICD-10-CM

## 2022-06-20 ENCOUNTER — Encounter: Payer: Self-pay | Admitting: Psychiatry

## 2022-06-20 ENCOUNTER — Ambulatory Visit (INDEPENDENT_AMBULATORY_CARE_PROVIDER_SITE_OTHER): Payer: Medicare Other | Admitting: Psychiatry

## 2022-06-20 VITALS — BP 116/77 | HR 75 | Temp 97.6°F | Wt 197.0 lb

## 2022-06-20 DIAGNOSIS — G4710 Hypersomnia, unspecified: Secondary | ICD-10-CM | POA: Insufficient documentation

## 2022-06-20 DIAGNOSIS — F339 Major depressive disorder, recurrent, unspecified: Secondary | ICD-10-CM | POA: Diagnosis not present

## 2022-06-20 DIAGNOSIS — F0390 Unspecified dementia without behavioral disturbance: Secondary | ICD-10-CM

## 2022-06-20 DIAGNOSIS — F410 Panic disorder [episodic paroxysmal anxiety] without agoraphobia: Secondary | ICD-10-CM

## 2022-06-20 DIAGNOSIS — F1021 Alcohol dependence, in remission: Secondary | ICD-10-CM

## 2022-06-20 DIAGNOSIS — G4733 Obstructive sleep apnea (adult) (pediatric): Secondary | ICD-10-CM

## 2022-06-20 MED ORDER — CLONAZEPAM 1 MG PO TABS: 0.5000 mg | ORAL_TABLET | Freq: Every day | ORAL | 1 refills | Status: DC | PRN

## 2022-06-20 MED ORDER — METHYLPHENIDATE HCL 10 MG PO TABS: 10.0000 mg | ORAL_TABLET | Freq: Every morning | ORAL | 0 refills | Status: DC

## 2022-06-20 MED ORDER — TRAZODONE HCL 100 MG PO TABS: 50.0000 mg | ORAL_TABLET | Freq: Every evening | ORAL | 1 refills | Status: DC | PRN

## 2022-06-20 NOTE — Progress Notes (Signed)
BH MD OP Progress Note  06/20/2022 5:31 PM Jodi Parrish  MRN:  [id number]  Chief Complaint:  Chief Complaint  Patient presents with   Follow-up   Depression   Anxiety   HPI: Jodi Parrish is a 76 year old female, married, lives in [place], has a history of bipolar disorder type II, major neurocognitive disorder, panic disorder, MDD, OSA, alcohol use disorder in remission was evaluated in office today.  Patient as well as spouse-Jodi Parrish participated in the evaluation, spouse provided collateral information.  Patient continues to be a limited historian, although pleasant, cooperative in session today.  Patient appeared to be alert, oriented to person and situation.  Patient appeared to be more energetic in session today.  Patient reports sleep is okay at night.  However continues to have sleepiness during the day.  When she wakes up in the morning around 8 or 9 AM she goes back to bed for another couple of hours.  She stays in bed until 12 or 1 PM.  Patient reports she may have noticed some benefit from the Ritalin although she wants to go back to bed every day in the a.m.  Reports she has been using the CPAP on a regular basis and that does not seem to help with the sleepiness during the day.  Patient continues to struggle with memory issues, organization skills, and so on.  She needs a lot of help from her spouse.  Denies any suicidality, homicidality or perceptual disturbances.  Per spouse patient does not seem to have had a lot of benefit from the Ritalin.  She continues to sleep during the day although she is sleeping at night.  Agreeable to referral to pulmonology.  Denies any other concerns today.  Visit Diagnosis:    ICD-10-CM   1. Major neurocognitive disorder (HCC)  F03.90    mild    2. Panic disorder  F41.0 clonazePAM (KLONOPIN) 1 MG tablet    DISCONTINUED: clonazePAM (KLONOPIN) 1 MG tablet    3. Major depressive disorder, recurrent episode with mixed features (HCC)   F33.9 traZODone (DESYREL) 100 MG tablet    methylphenidate (RITALIN) 10 MG tablet    4. Hypersomnia  G47.10 methylphenidate (RITALIN) 10 MG tablet    Ambulatory referral to Pulmonology    5. OSA (obstructive sleep apnea)  G47.33    on CPAP    6. Alcohol use disorder, severe, in sustained remission (HCC)  F10.21       Past Psychiatric History: Reviewed past psychiatric history from progress note on 04/28/2020.  Past trials of medications-Celexa, Prozac, Zoloft, Cymbalta, Abilify, Lamictal, mirtazapine, trazodone-multiple other medications.  Past Medical History:  Past Medical History:  Diagnosis Date   Ankle fracture    right   Anxiety    Arthritis    Bipolar disorder (HCC)    Depression    Diverticulitis    Hyperlipidemia    Hypertension    Osteoarthritis    Overactive bladder     Past Surgical History:  Procedure Laterality Date   ABDOMINAL HYSTERECTOMY  1998   BARTHOLIN GLAND CYST EXCISION  1976   COLONOSCOPY     COLONOSCOPY WITH PROPOFOL N/A 04/10/2018   Procedure: COLONOSCOPY WITH PROPOFOL;  Surgeon: Toledo, Boykin Nearing, MD;  Location: [facility];  Service: Gastroenterology;  Laterality: N/A;   ELBOW SURGERY Right 1995   crushed elbow with hardware implanted   ESOPHAGOGASTRODUODENOSCOPY (EGD) WITH PROPOFOL N/A 04/10/2018   Procedure: ESOPHAGOGASTRODUODENOSCOPY (EGD) WITH PROPOFOL;  Surgeon: Norma Fredrickson, Boykin Nearing, MD;  Location: [facility];  Service: Gastroenterology;  Laterality: N/A;   JOINT REPLACEMENT Left 2011   knee   JOINT REPLACEMENT Right 2009   elbow   KNEE SURGERY Left 2000   PELVIC FLOOR REPAIR  2009   PELVIC FLOOR LIFT   REPLACEMENT TOTAL KNEE Left 2016   TONSILLECTOMY  1958   TOTAL HIP ARTHROPLASTY Left 2008   TOTAL HIP ARTHROPLASTY Right 01/10/2017   Procedure: TOTAL HIP ARTHROPLASTY ANTERIOR APPROACH;  Surgeon: Hessie Knows, MD;  Location: [facility];  Service: Orthopedics;  Laterality: Right;    Family Psychiatric History: Reviewed family  psychiatric history from progress note on 04/28/2020.  Family History:  Family History  Problem Relation Age of Onset   Parkinson's disease Mother    CAD Father    Hypertension Father    Anxiety disorder Daughter    Depression Daughter    Suicidality Daughter    Alcohol abuse Daughter    Anxiety disorder Daughter    Anxiety disorder Son    Breast cancer Neg Hx     Social History: Reviewed social history from progress note on 04/28/2020. Social History   Socioeconomic History   Marital status: Married    Spouse name: Not on file   Number of children: Not on file   Years of education: Not on file   Highest education level: Not on file  Occupational History   Not on file  Tobacco Use   Smoking status: Former    Types: Cigarettes   Smokeless tobacco: Never  Vaping Use   Vaping Use: Never used  Substance and Sexual Activity   Alcohol use: Not Currently    Comment: HEAVY USE IN THE PAST- QUIT 3 YEARS AGO   Drug use: No   Sexual activity: Yes  Other Topics Concern   Not on file  Social History Narrative   Not on file   Social Determinants of Health   Financial Resource Strain: Not on file  Food Insecurity: Not on file  Transportation Needs: Not on file  Physical Activity: Not on file  Stress: Not on file  Social Connections: Not on file    Allergies:  Allergies  Allergen Reactions   Budesonide-Formoterol Fumarate     Other reaction(s): Unknown   Abilify [Aripiprazole] Anxiety    SEVERE HYPERACTIVITY   Duloxetine Anxiety    Metabolic Disorder Labs: No results found for: "HGBA1C", "MPG" No results found for: "PROLACTIN" No results found for: "CHOL", "TRIG", "HDL", "CHOLHDL", "VLDL", "LDLCALC" Lab Results  Component Value Date   TSH 0.95 09/18/2013    Therapeutic Level Labs: No results found for: "LITHIUM" No results found for: "VALPROATE" No results found for: "CBMZ"  Current Medications: Current Outpatient Medications  Medication Sig Dispense  Refill   acetaminophen (TYLENOL) 650 MG CR tablet Take 650 mg by mouth every 8 (eight) hours as needed for pain.     Cholecalciferol (VITAMIN D-3) 5000 units TABS Take 1 tablet by mouth daily.     folic acid (FOLVITE) 629 MCG tablet Take 400 mcg by mouth daily.     gabapentin (NEURONTIN) 100 MG capsule Take 200 mg by mouth 3 (three) times daily.     HYDROcodone-acetaminophen (NORCO/VICODIN) 5-325 MG tablet Take 1 tablet by mouth every 6 (six) hours as needed.     magnesium 30 MG tablet Take 250 mg by mouth daily.     methylphenidate (RITALIN) 10 MG tablet Take 1 tablet (10 mg total) by mouth in the morning. 30 tablet 0  Multiple Vitamins-Minerals (MULTIVITAMIN WITH MINERALS) tablet Take 1 tablet by mouth daily.     polyethylene glycol (MIRALAX / GLYCOLAX) 17 g packet Take 17 g by mouth daily.     pravastatin (PRAVACHOL) 40 MG tablet Take 40 mg by mouth at bedtime.     propranolol ER (INDERAL LA) 60 MG 24 hr capsule Take 60 mg by mouth daily. Takes differently     QUEtiapine (SEROQUEL) 100 MG tablet TAKE 1 TABLET(100 MG) BY MOUTH AT BEDTIME 30 tablet 2   Simethicone (GAS-X EXTRA STRENGTH) 125 MG CAPS Take 125 mg by mouth. Twice a day as needed     UNABLE TO FIND 1 Device by Other route as directed. 1 Device O   vitamin B-12 (CYANOCOBALAMIN) 1000 MCG tablet Take 1,000 mcg by mouth daily.     vortioxetine HBr (TRINTELLIX) 10 MG TABS tablet Take 1 tablet (10 mg total) by mouth daily. Stop Trintellix 20 mg 30 tablet 1   clonazePAM (KLONOPIN) 1 MG tablet Take 0.5 tablets (0.5 mg total) by mouth daily as needed for anxiety. 15 tablet 1   fluticasone (FLONASE) 50 MCG/ACT nasal spray Place into the nose. (Patient not taking: Reported on 05/19/2022)     traZODone (DESYREL) 100 MG tablet Take 0.5 tablets (50 mg total) by mouth at bedtime as needed for sleep. 30 tablet 1   No current facility-administered medications for this visit.     Musculoskeletal: Strength & Muscle Tone: within normal  limits Gait & Station: normal Patient leans: N/A  Psychiatric Specialty Exam: Review of Systems  Psychiatric/Behavioral:  Positive for decreased concentration, dysphoric mood and sleep disturbance.   All other systems reviewed and are negative.   Blood pressure 116/77, pulse 75, temperature 97.6 F (36.4 C), temperature source Temporal, weight 197 lb (89.4 kg).Body mass index is 29.95 kg/m.  General Appearance: Casual  Eye Contact:  Fair  Speech:  Clear and Coherent  Volume:  Normal  Mood:  Depressed improving  Affect:  Appropriate  Thought Process:  Goal Directed and Descriptions of Associations: Intact  Orientation:  Other:  Person, place, situation  Thought Content: Logical   Suicidal Thoughts:  No  Homicidal Thoughts:  No  Memory:  Immediate;   Fair Recent;   Poor Remote;   Poor  Judgement:  Fair  Insight:  Shallow  Psychomotor Activity:  Normal  Concentration:  Concentration: Fair and Attention Span: Fair  Recall:  [employer] of Knowledge: Fair  Language: Fair  Akathisia:  No  Handed:  Right  AIMS (if indicated): done  Assets:  Desire for Improvement Housing Intimacy Social Support  ADL's:  Intact  Cognition: WNL  Sleep:  Fair at night, excessive during the day   Screenings: AIMS    Flowsheet Row Office Visit from 06/20/2022 in [facility] Office Visit from 05/19/2022 in [facility] Office Visit from 04/07/2022 in [facility] Office Visit from 12/08/2021 in [facility]  AIMS Total Score 0 0 0 0      GAD-7    Flowsheet Row Office Visit from 12/08/2021 in [facility]  Total GAD-7 Score 1      PHQ2-9    Flowsheet Row Office Visit from 06/20/2022 in [facility] Office Visit from 05/19/2022 in [facility] Office Visit from 12/08/2021 in [facility]  [facility] Office Visit from 10/20/2021 in [facility] Office Visit from 07/27/2021 in [facility]  PHQ-2 Total Score 1 4 1 3 1   PHQ-9 Total Score 5 16 -- 14 --      Flowsheet Row Office Visit from 06/20/2022 in [facility] Office Visit from 05/19/2022 in [facility] Office Visit from 04/07/2022 in [facility]  C-SSRS RISK CATEGORY No Risk No Risk No Risk        Assessment and Plan: Jodi Parrish is a 76 year old Caucasian female, married, has a history of bipolar disorder type II, anxiety disorder, major neurocognitive disorder, panic disorder, chronic pain, obstructive sleep apnea on CPAP was evaluated in office today.  Patient continues to struggle with excessive sleepiness, memory problems, will benefit from the following plan.  Plan Major neurocognitive disorder-unstable Last neuropsychological testing-06/26/2020.  Patient has upcoming appointment to repeat neuropsychological testing with Dr. 08/26/2020. Continue follow-up with neurology.  GAD-improving Gabapentin 200 mg p.o. 3 times daily Trintellix 10 mg p.o. daily. Seroquel at her reduced dose to 100 mg p.o. nightly-dose reduced due to sleepiness Advised to start taking clonazepam 0.5 mg daily as needed and stop taking if not needed.   Panic disorder-improving Continue Trintellix and clonazepam as noted above  MDD-improving Continue Trintellix 10 mg p.o. daily Increase Ritalin to 10 mg p.o. daily in the morning. Reduce trazodone to 50 mg at bedtime as needed. Continue Seroquel 100 mg at bedtime.  Hypersomnia/obstructive sleep apnea-unstable Currently on CPAP-sleep study-03/10/2021. Will refer to pulmonology for further evaluation and management.  Collateral information obtained from spouse as noted above.    Collaboration of Care: Collaboration of Care: Other  encouraged to follow up with pulmonology for continued excessive sleepiness/hypersomnia.  Patient/Guardian was advised Release of Information must be obtained prior to any record release in order to collaborate their care with an outside provider. Patient/Guardian was advised if they have not already done so to contact the registration department to sign all necessary forms in order for 03/12/2021 to release information regarding their care.   Consent: Patient/Guardian gives verbal consent for treatment and assignment of benefits for services provided during this visit. Patient/Guardian expressed understanding and agreed to proceed.   This note was generated in part or whole with voice recognition software. Voice recognition is usually quite accurate but there are transcription errors that can and very often do occur. I apologize for any typographical errors that were not detected and corrected.      [place], MD 06/21/2022, 10:34 AM

## 2022-07-18 ENCOUNTER — Encounter: Payer: Self-pay | Admitting: Psychiatry

## 2022-07-18 ENCOUNTER — Ambulatory Visit (INDEPENDENT_AMBULATORY_CARE_PROVIDER_SITE_OTHER): Payer: Medicare Other | Admitting: Psychiatry

## 2022-07-18 VITALS — BP 132/81 | HR 74 | Temp 97.6°F | Ht 67.0 in | Wt 195.4 lb

## 2022-07-18 DIAGNOSIS — F339 Major depressive disorder, recurrent, unspecified: Secondary | ICD-10-CM | POA: Diagnosis not present

## 2022-07-18 DIAGNOSIS — F0390 Unspecified dementia without behavioral disturbance: Secondary | ICD-10-CM | POA: Diagnosis not present

## 2022-07-18 DIAGNOSIS — F1021 Alcohol dependence, in remission: Secondary | ICD-10-CM

## 2022-07-18 DIAGNOSIS — G4710 Hypersomnia, unspecified: Secondary | ICD-10-CM

## 2022-07-18 DIAGNOSIS — F410 Panic disorder [episodic paroxysmal anxiety] without agoraphobia: Secondary | ICD-10-CM | POA: Diagnosis not present

## 2022-07-18 DIAGNOSIS — Z8659 Personal history of other mental and behavioral disorders: Secondary | ICD-10-CM

## 2022-07-18 DIAGNOSIS — G4733 Obstructive sleep apnea (adult) (pediatric): Secondary | ICD-10-CM

## 2022-07-18 MED ORDER — METHYLPHENIDATE HCL 10 MG PO TABS: 10.0000 mg | ORAL_TABLET | Freq: Every morning | ORAL | 0 refills | Status: DC

## 2022-07-18 MED ORDER — VORTIOXETINE HBR 10 MG PO TABS: 10.0000 mg | ORAL_TABLET | Freq: Every day | ORAL | 2 refills | Status: DC

## 2022-07-18 MED ORDER — QUETIAPINE FUMARATE 100 MG PO TABS: 100.0000 mg | ORAL_TABLET | Freq: Every day | ORAL | 2 refills | Status: DC

## 2022-07-18 MED ORDER — TRAZODONE HCL 100 MG PO TABS: 100.0000 mg | ORAL_TABLET | Freq: Every evening | ORAL | 2 refills | Status: DC | PRN

## 2022-07-18 MED ORDER — METHYLPHENIDATE HCL 10 MG PO TABS: 10.0000 mg | ORAL_TABLET | Freq: Every day | ORAL | 0 refills | Status: DC

## 2022-07-18 NOTE — Progress Notes (Unsigned)
[facility] MD OP Progress Note  07/18/2022 11:13 AM Jodi Parrish  MRN:  [id number]  Chief Complaint:  Chief Complaint  Patient presents with   Follow-up   Medication Refill   Anxiety   Depression   HPI: Jodi Parrish is a 76 year old female, married, lives in [place], has a history of bipolar disorder type II, major neurocognitive disorder, panic disorder, MDD, OSA, alcohol use disorder in remission was evaluated in office today.  Patient as well as spouse-Jodi Parrish participated in the evaluation today.  Patient today appeared to be anxious on and off, continues to have problems with memory and needed a lot of support from her spouse.  Spouse hence provided collateral information.  Overall patient doing better with regards to her depression symptoms.  She does not take a lot of naps during the day like she used to before and that has improved with the Ritalin higher dosage.  She however has been struggling with sleep at night.  She has difficulty falling asleep.  Likely also since she does not have a good sleep hygiene and has been watching TV for a couple of hours at night prior to bedtime.  Also has pain, chronic, which does likely have an impact on sleep as well.  Currently taking Tylenol as needed.  Currently taking a higher dosage of trazodone-100 mg, it was reduced to 50 mg last visit.  The Trazodone 100 mg also does not seem to help at least some nights.  Patient continues to have anxiety symptoms on and off mostly about being left alone by her spouse. Spouse has been busy taking care of a lot of things that they have to do prior to moving into the independent living at the [facility].  Patient also struggles with the fact that she is unable to drive.  Agrees to discuss this with neurologist at the next visit.  Currently compliant on her medications, denies side effects.  Denies any suicidality, homicidality or perceptual disturbances.  Alert, oriented to person place the month,  the year and the date.  Denies any other concerns today.  Visit Diagnosis:    ICD-10-CM   1. Major neurocognitive disorder (HCC)  F03.90 QUEtiapine (SEROQUEL) 100 MG tablet    methylphenidate (RITALIN) 10 MG tablet    2. Panic disorder  F41.0 QUEtiapine (SEROQUEL) 100 MG tablet    3. Major depressive disorder, recurrent episode with mixed features (HCC)  F33.9 methylphenidate (RITALIN) 10 MG tablet    traZODone (DESYREL) 100 MG tablet    vortioxetine HBr (TRINTELLIX) 10 MG TABS tablet    methylphenidate (RITALIN) 10 MG tablet   improving    4. Hypersomnia  G47.10 methylphenidate (RITALIN) 10 MG tablet    methylphenidate (RITALIN) 10 MG tablet    5. OSA (obstructive sleep apnea)  G47.33     6. Alcohol use disorder, severe, in sustained remission ([place])  F10.21     7. History of bipolar disorder  Z86.59 QUEtiapine (SEROQUEL) 100 MG tablet      Past Psychiatric History: Reviewed past psychiatric history from progress note on 04/28/2020.  Past trials of medications-Celexa, Prozac, Zoloft, Cymbalta, Abilify, Lamictal, mirtazapine, trazodone-multiple other medications.  Past Medical History:  Past Medical History:  Diagnosis Date   Ankle fracture    right   Anxiety    Arthritis    Bipolar disorder ([place])    Depression    Diverticulitis    Hyperlipidemia    Hypertension    Osteoarthritis  Overactive bladder     Past Surgical History:  Procedure Laterality Date   ABDOMINAL HYSTERECTOMY  1998   BARTHOLIN GLAND CYST EXCISION  1976   COLONOSCOPY     COLONOSCOPY WITH PROPOFOL N/A 04/10/2018   Procedure: COLONOSCOPY WITH PROPOFOL;  Surgeon: Toledo, Benay Pike, MD;  Location: [facility] ENDOSCOPY;  Service: Gastroenterology;  Laterality: N/A;   ELBOW SURGERY Right 1995   crushed elbow with hardware implanted   ESOPHAGOGASTRODUODENOSCOPY (EGD) WITH PROPOFOL N/A 04/10/2018   Procedure: ESOPHAGOGASTRODUODENOSCOPY (EGD) WITH PROPOFOL;  Surgeon: Toledo, Benay Pike, MD;  Location: [facility]  ENDOSCOPY;  Service: Gastroenterology;  Laterality: N/A;   JOINT REPLACEMENT Left 2011   knee   JOINT REPLACEMENT Right 2009   elbow   KNEE SURGERY Left 2000   PELVIC FLOOR REPAIR  2009   PELVIC FLOOR LIFT   REPLACEMENT TOTAL KNEE Left 2016   TONSILLECTOMY  1958   TOTAL HIP ARTHROPLASTY Left 2008   TOTAL HIP ARTHROPLASTY Right 01/10/2017   Procedure: TOTAL HIP ARTHROPLASTY ANTERIOR APPROACH;  Surgeon: Hessie Knows, MD;  Location: [facility] ORS;  Service: Orthopedics;  Laterality: Right;    Family Psychiatric History: Reviewed family psychiatric history from progress note on 04/28/2020.  Family History:  Family History  Problem Relation Age of Onset   Parkinson's disease Mother    CAD Father    Hypertension Father    Anxiety disorder Daughter    Depression Daughter    Suicidality Daughter    Alcohol abuse Daughter    Anxiety disorder Daughter    Anxiety disorder Son    Breast cancer Neg Hx     Social History: Reviewed social history from progress note on 04/28/2020. Social History   Socioeconomic History   Marital status: Married    Spouse name: Not on file   Number of children: Not on file   Years of education: Not on file   Highest education level: Not on file  Occupational History   Not on file  Tobacco Use   Smoking status: Former    Types: Cigarettes   Smokeless tobacco: Never  Vaping Use   Vaping Use: Never used  Substance and Sexual Activity   Alcohol use: Not Currently    Comment: HEAVY USE IN THE PAST- QUIT 3 YEARS AGO   Drug use: No   Sexual activity: Yes  Other Topics Concern   Not on file  Social History Narrative   Not on file   Social Determinants of Health   Financial Resource Strain: Not on file  Food Insecurity: Not on file  Transportation Needs: Not on file  Physical Activity: Not on file  Stress: Not on file  Social Connections: Not on file    Allergies:  Allergies  Allergen Reactions   Budesonide-Formoterol Fumarate     Other  reaction(s): Unknown   Abilify [Aripiprazole] Anxiety    SEVERE HYPERACTIVITY   Duloxetine Anxiety    Metabolic Disorder Labs: No results found for: "HGBA1C", "MPG" No results found for: "PROLACTIN" No results found for: "CHOL", "TRIG", "HDL", "CHOLHDL", "VLDL", "LDLCALC" Lab Results  Component Value Date   TSH 0.95 09/18/2013    Therapeutic Level Labs: No results found for: "LITHIUM" No results found for: "VALPROATE" No results found for: "CBMZ"  Current Medications: Current Outpatient Medications  Medication Sig Dispense Refill   acetaminophen (TYLENOL) 650 MG CR tablet Take 650 mg by mouth every 8 (eight) hours as needed for pain.     Cholecalciferol (VITAMIN D-3) 5000 units TABS Take 1 tablet  by mouth daily.     clonazePAM (KLONOPIN) 1 MG tablet Take 0.5 tablets (0.5 mg total) by mouth daily as needed for anxiety. 15 tablet 1   fluticasone (FLONASE) 50 MCG/ACT nasal spray Place into the nose.     folic acid (FOLVITE) 800 MCG tablet Take 400 mcg by mouth daily.     gabapentin (NEURONTIN) 100 MG capsule Take 200 mg by mouth 3 (three) times daily.     HYDROcodone-acetaminophen (NORCO/VICODIN) 5-325 MG tablet Take 1 tablet by mouth every 6 (six) hours as needed.     magnesium 30 MG tablet Take 250 mg by mouth daily.     [START ON 08/19/2022] methylphenidate (RITALIN) 10 MG tablet Take 1 tablet (10 mg total) by mouth daily with breakfast. 30 tablet 0   Multiple Vitamins-Minerals (MULTIVITAMIN WITH MINERALS) tablet Take 1 tablet by mouth daily.     polyethylene glycol (MIRALAX / GLYCOLAX) 17 g packet Take 17 g by mouth daily.     pravastatin (PRAVACHOL) 40 MG tablet Take 40 mg by mouth at bedtime.     propranolol ER (INDERAL LA) 60 MG 24 hr capsule Take 60 mg by mouth daily. Takes differently     Simethicone (GAS-X EXTRA STRENGTH) 125 MG CAPS Take 125 mg by mouth. Twice a day as needed     UNABLE TO FIND 1 Device by Other route as directed. 1 Device O   vitamin B-12  (CYANOCOBALAMIN) 1000 MCG tablet Take 1,000 mcg by mouth daily.     [START ON 07/20/2022] methylphenidate (RITALIN) 10 MG tablet Take 1 tablet (10 mg total) by mouth in the morning. 30 tablet 0   QUEtiapine (SEROQUEL) 100 MG tablet Take 1 tablet (100 mg total) by mouth at bedtime. 30 tablet 2   traZODone (DESYREL) 100 MG tablet Take 1-1.5 tablets (100-150 mg total) by mouth at bedtime as needed for sleep. 45 tablet 2   vortioxetine HBr (TRINTELLIX) 10 MG TABS tablet Take 1 tablet (10 mg total) by mouth daily. Stop Trintellix 20 mg 30 tablet 2   No current facility-administered medications for this visit.     Musculoskeletal: Strength & Muscle Tone: within normal limits Gait & Station: normal Patient leans: N/A  Psychiatric Specialty Exam: Review of Systems  Psychiatric/Behavioral:  Positive for sleep disturbance. The patient is nervous/anxious.   All other systems reviewed and are negative.   Blood pressure 132/81, pulse 74, temperature 97.6 F (36.4 C), temperature source Oral, height 5\' 7"  (1.702 m), weight 195 lb 6.4 oz (88.6 kg).Body mass index is 30.6 kg/m.  General Appearance: Casual  Eye Contact:  Fair  Speech:  Normal Rate  Volume:  Normal  Mood:  Anxious  Affect:  Congruent  Thought Process:  Goal Directed and Descriptions of Associations: Intact  Orientation:  Full (Time, Place, and Person)  Thought Content: Logical   Suicidal Thoughts:  No  Homicidal Thoughts:  No  Memory:  Immediate;   Fair Recent;   Poor Remote;   Poor  Judgement:  Fair  Insight:  Shallow  Psychomotor Activity:  Normal  Concentration:  Concentration: Fair and Attention Span: Fair  Recall:  [employer] of Knowledge: Fair  Language: Fair  Akathisia:  No  Handed:  Right  AIMS (if indicated): done  Assets:  Communication Skills Desire for Improvement Housing Intimacy Social Support  ADL's:  Intact  Cognition: WNL  Sleep:   has difficulty falling asleep , hypersomnia during day although  improving   Screenings: AIMS  [place] Visit from 07/18/2022 in [facility] Office Visit from 06/20/2022 in [facility] Office Visit from 05/19/2022 in [facility] Office Visit from 04/07/2022 in [facility] Office Visit from 12/08/2021 in [facility] Total Score 0 0 0 0 [place] Visit from 12/08/2021 in [facility]  Total GAD-7 Score 1      PHQ2-9    [place] Visit from 07/18/2022 in [facility] Office Visit from 06/20/2022 in [facility] Visit from 05/19/2022 in [facility] Office Visit from 12/08/2021 in [facility] Office Visit from 10/20/2021 in [facility]  PHQ-2 Total Score 2 1 4 1 3   PHQ-9 Total Score 4 5 16  -- [place] Office Visit from 07/18/2022 in [facility] Office Visit from 06/20/2022 in [facility] Office Visit from 05/19/2022 in [facility] No Risk No Risk No Risk        Assessment and Plan: CYDNEY LAFLAMME Parrish is a 76 year old Caucasian female, married, has a history of bipolar disorder type II, anxiety disorder, major neurocognitive disorder, panic disorder, chronic pain, obstructive sleep apnea on CPAP was evaluated in office today.  Patient with some improvement on the Ritalin addition continues to have sleep problems, will benefit from the following plan.  Plan Major neurocognitive disorder-unstable Last neuropsychological testing-06/26/2020.  Dr. Alphonzo Severance. Patient has upcoming appointment with neuropsychologist Dr. Sima Matas for repeat testing in March 2024. Continue  follow-up with neurology-Dr. Melrose Nakayama.  GAD-improving Gabapentin 200 mg p.o. 3 times daily Trintellix 10 mg p.o. daily Seroquel 100 mg p.o. nightly-reduced dosage Currently limiting clonazepam, prescribed as 0.5 mg as needed, may have used it twice last few weeks.  Patient to use it only as needed   Panic disorder-improving Continue Trintellix as prescribed Clonazepam as needed only. Reviewed [place] PMP AWARxE  MDD in partial remission Trintellix 10 mg p.o. daily Ritalin 10 mg p.o. daily in the morning Increase trazodone to 100 -150 milligram p.o. nightly as needed for sleep.  Advised to work on sleep hygiene including switching of TV prior to bedtime. Seroquel 100 mg at bedtime-reduced dosage due to hypersomnia during the day.  Hypersomnia/sleep apnea-CPAP problem-unstable Patient has upcoming appointment with pulmonologist on August 18, 2022.  Patient agrees to keep the appointment.  Collateral information obtained from spouse as noted above.  Follow-up in clinic in 8 weeks or sooner if needed.  This note was generated in part or whole with voice recognition software. Voice recognition is usually quite accurate but there are transcription errors that can and very often do occur. I apologize for any typographical errors that were not detected and corrected.     Ursula Alert, MD 07/19/2022, 8:25 AM

## 2022-08-07 ENCOUNTER — Other Ambulatory Visit: Payer: Self-pay | Admitting: Psychiatry

## 2022-08-07 DIAGNOSIS — F339 Major depressive disorder, recurrent, unspecified: Secondary | ICD-10-CM

## 2022-08-10 ENCOUNTER — Ambulatory Visit: Payer: Medicare Other | Admitting: Psychiatry

## 2022-08-17 ENCOUNTER — Telehealth: Payer: Self-pay

## 2022-08-17 NOTE — Telephone Encounter (Signed)
Noted  

## 2022-08-17 NOTE — Telephone Encounter (Signed)
Spoke to patient and requested that she bring SD card to 08/18/2022.

## 2022-08-18 ENCOUNTER — Telehealth: Payer: Self-pay

## 2022-08-18 ENCOUNTER — Ambulatory Visit (INDEPENDENT_AMBULATORY_CARE_PROVIDER_SITE_OTHER): Payer: Medicare Other | Admitting: Adult Health

## 2022-08-18 ENCOUNTER — Encounter: Payer: Self-pay | Admitting: Adult Health

## 2022-08-18 VITALS — BP 124/72 | HR 80 | Temp 97.9°F | Ht 68.0 in | Wt 190.8 lb

## 2022-08-18 DIAGNOSIS — G4700 Insomnia, unspecified: Secondary | ICD-10-CM | POA: Diagnosis not present

## 2022-08-18 DIAGNOSIS — G4733 Obstructive sleep apnea (adult) (pediatric): Secondary | ICD-10-CM

## 2022-08-18 DIAGNOSIS — G4710 Hypersomnia, unspecified: Secondary | ICD-10-CM | POA: Diagnosis not present

## 2022-08-18 NOTE — Progress Notes (Signed)
@Patient  ID: , female    DOB: 1945/12/01, 76 y.o.   MRN: [id number]  Chief Complaint  Patient presents with   sleep consult    Referring provider: [id number], MD  HPI: 76 year old female seen for sleep consult August 18, 2022 to establish for sleep apnea "History significant for bipolar disorder, memory loss and anxiety  TEST/EVENTS :  NPSG Feb 09, 2021 showed mild sleep apnea with AHI 10.6/hour, REM related AHI 18.3/hour and SPO2 low at 79%  06/11/21: Carotid ultrasound bilateral IMPRESSION:  1. Mild right carotid bifurcation plaque resulting in less than 50%  diameter ICA stenosis.  2. No significant left carotid plaque or stenosis.  3. Antegrade bilateral vertebral arterial flow.   03/15/2021 MRI BRAIN WO IMPRESSION:  1. 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  2. Mild parenchymal volume loss.   08/28/20 MRI HEAD WITHOUT CONTRAST Impression: Normal brain MRI for age. No acute or reversible intracranial abnormality identified.    08/18/2022 Sleep consult  Patient presents for sleep consult today.  Kindly referred by Dr. 08/20/2022.  Patient was diagnosed with sleep apnea in May 2022.  She was set up for a in lab sleep study that was completed on Feb 09, 2021 that showed mild sleep apnea with AHI at 10.6/hour and SPO2 low at 79%, REM related AHI 18.3/hour.  Patient is accompanied by her husband for today's visit.  Says that they received a CPAP machine in February of this year.  Does try to wear it every night but usually gets in about 2 to 3 hours only because it is very uncomfortable to keep on all night long.  We have requested a CPAP download for compliance and control review.  She is currently using a nasal mask.  Patient is followed extensively by neurology and psychiatry.  Has underlying disorder with bipolar, depression, anxiety.  She also has memory  impairment.  There is report of previous episodes of presyncope.  She is on multiple sedating medications including Neurontin, trazodone and Seroquel.  Patient's husband says these doses have been decreased to try to help with daytime sleepiness.  She has been placed on Ritalin which has helped some of her daytime sleepiness.  Patient says she does not feel like that she is not sleepy during the daytime but her husband says that daytime sleepiness has been a major issue.  She does typically take a nap during the daytime.  For at least 1 hour or more.  Takes in about 3 cups of caffeine daily.  No history of known stroke or congestive heart failure. Epworth score 1 out of 24.  Gets some sleepiness in the afternoon hours. CPAP download shows excellent compliance at 100% usage.  Daily average usage at 5 hours.  AHI 7.2/hour.  Patient is on CPAP 6 hours H2O. Social history patient is retired March.  Lives with her husband at home.  Has adult children.  She has a minimum smoking history.  Quit alcohol in 2018.  No drug use.  Family history none.  Surgical history previous hysterectomy, knee surgery, hip surgery   Allergies  Allergen Reactions   Budesonide-Formoterol Fumarate     Other reaction(s): Unknown   Abilify [Aripiprazole] Anxiety    SEVERE HYPERACTIVITY   Duloxetine Anxiety    Immunization History  Administered Date(s) Administered   COVID-19, mRNA, vaccine(Comirnaty)12 years and older 06/25/2022   Influenza, High Dose Seasonal  PF 06/14/2018, 05/20/2019, 08/26/2019, 07/02/2020, 06/16/2021, 06/25/2022   Influenza-Unspecified 07/08/2014, 07/01/2015, 07/22/2016, 06/19/2017, 06/14/2018   PFIZER Comirnaty(Gray Top)Covid-19 Tri-Sucrose Vaccine 01/04/2021   Pfizer Covid-19 Vaccine Bivalent Booster 42yrs & up 06/16/2021   Pneumococcal Conjugate-13 07/08/2015   Respiratory Syncytial Virus Vaccine,Recomb Aduvanted(Arexvy) 06/25/2022    Past Medical History:  Diagnosis Date   Ankle fracture     right   Anxiety    Arthritis    Bipolar disorder (HCC)    Depression    Diverticulitis    Hyperlipidemia    Hypertension    Osteoarthritis    Overactive bladder     Tobacco History: Social History   Tobacco Use  Smoking Status Former   Types: Cigarettes  Smokeless Tobacco Never  Tobacco Comments   Smoked in college.    Counseling given: Not Answered Tobacco comments: Smoked in college.    Outpatient Medications Prior to Visit  Medication Sig Dispense Refill   acetaminophen (TYLENOL) 650 MG CR tablet Take 650 mg by mouth every 8 (eight) hours as needed for pain.     Cholecalciferol (VITAMIN D-3) 5000 units TABS Take 1 tablet by mouth daily.     clonazePAM (KLONOPIN) 1 MG tablet Take 0.5 tablets (0.5 mg total) by mouth daily as needed for anxiety. 15 tablet 1   folic acid (FOLVITE) 800 MCG tablet Take 400 mcg by mouth daily.     gabapentin (NEURONTIN) 100 MG capsule Take 200 mg by mouth 3 (three) times daily.     magnesium 30 MG tablet Take 250 mg by mouth daily.     [START ON 08/19/2022] methylphenidate (RITALIN) 10 MG tablet Take 1 tablet (10 mg total) by mouth daily with breakfast. 30 tablet 0   polyethylene glycol (MIRALAX / GLYCOLAX) 17 g packet Take 17 g by mouth daily.     pravastatin (PRAVACHOL) 40 MG tablet Take 40 mg by mouth at bedtime.     propranolol ER (INDERAL LA) 60 MG 24 hr capsule Take 60 mg by mouth daily. Takes differently     QUEtiapine (SEROQUEL) 100 MG tablet Take 1 tablet (100 mg total) by mouth at bedtime. 30 tablet 2   traZODone (DESYREL) 100 MG tablet Take 1-1.5 tablets (100-150 mg total) by mouth at bedtime as needed for sleep. 45 tablet 2   TRINTELLIX 10 MG TABS tablet TAKE 1 TABLET(10 MG) BY MOUTH DAILY. STOP TRINTELLIX 20 MG 30 tablet 2   UNABLE TO FIND 1 Device by Other route as directed. 1 Device O   vitamin B-12 (CYANOCOBALAMIN) 1000 MCG tablet Take 1,000 mcg by mouth daily.     fluticasone (FLONASE) 50 MCG/ACT nasal spray Place into the  nose.     Multiple Vitamins-Minerals (MULTIVITAMIN WITH MINERALS) tablet Take 1 tablet by mouth daily.     HYDROcodone-acetaminophen (NORCO/VICODIN) 5-325 MG tablet Take 1 tablet by mouth every 6 (six) hours as needed. (Patient not taking: Reported on 08/18/2022)     methylphenidate (RITALIN) 10 MG tablet Take 1 tablet (10 mg total) by mouth in the morning. (Patient not taking: Reported on 08/18/2022) 30 tablet 0   Simethicone (GAS-X EXTRA STRENGTH) 125 MG CAPS Take 125 mg by mouth. Twice a day as needed (Patient not taking: Reported on 08/18/2022)     No facility-administered medications prior to visit.     Review of Systems:   Constitutional:   No  weight loss, night sweats,  Fevers, chills,  +fatigue, or  lassitude.  HEENT:   No headaches,  Difficulty swallowing,  Tooth/dental problems, or  Sore throat,                No sneezing, itching, ear ache, nasal congestion, post nasal drip,   CV:  No chest pain,  Orthopnea, PND, swelling in lower extremities, anasarca, dizziness, palpitations, syncope.   GI  No heartburn, indigestion, abdominal pain, nausea, vomiting, diarrhea, change in bowel habits, loss of appetite, bloody stools.   Resp: No shortness of breath with exertion or at rest.  No excess mucus, no productive cough,  No non-productive cough,  No coughing up of blood.  No change in color of mucus.  No wheezing.  No chest wall deformity  Skin: no rash or lesions.  GU: no dysuria, change in color of urine, no urgency or frequency.  No flank pain, no hematuria   MS:  No joint pain or swelling.  No decreased range of motion.  No back pain.    Physical Exam  BP 124/72 (BP Location: Left Arm, Cuff Size: Normal)   Pulse 80   Temp 97.9 F (36.6 C) (Temporal)   Ht 5\' 8"  (1.727 m)   Wt 190 lb 12.8 oz (86.5 kg)   SpO2 98%   BMI 29.01 kg/m   GEN: A/Ox3; pleasant , NAD, well nourished    HEENT:  [place]/AT,  , NOSE-clear, THROAT-clear, no lesions, no postnasal drip or exudate  noted. Class 2-3 MP airway   NECK:  Supple w/ fair ROM; no JVD; normal carotid impulses w/o bruits; no thyromegaly or nodules palpated; no lymphadenopathy.    RESP  Clear  P & A; w/o, wheezes/ rales/ or rhonchi. no accessory muscle use, no dullness to percussion  CARD:  RRR, no m/r/g, no peripheral edema, pulses intact, no cyanosis or clubbing.  GI:   Soft & nt; nml bowel sounds; no organomegaly or masses detected.   Musco: Warm bil, no deformities or joint swelling noted.   Neuro: alert, no focal deficits noted.    Skin: Warm, no lesions or rashes    Lab Results:    BMET   BNP No results found for: "BNP"  ProBNP No results found for: "PROBNP"  Imaging: No results found.        No data to display          No results found for: "NITRICOXIDE"      Assessment & Plan:   OSA (obstructive sleep apnea) Mild obstructive sleep apnea-CPAP download shows good compliance however needs to increase daily usage.  We will adjust CPAP pressure to hopefully decrease number of events.  We will change CPAP to AutoSet 6 to 12 cm H2O.  Change over to a DreamWear nasal mask.  Plan  Patient Instructions  Wear CPAP all night long for at least 6 or more hours Change to CPAP AutoSet 6 to 12 cm H2O Try DreamWear nasal mask Work on healthy weight Do not drive if sleepy Saline nasal spray Twice daily   Saline nasal gel At bedtime   Follow-up in 2 months and as needed     Hypersomnia Excessive daytime sleepiness with underlying sleep apnea.  Suspect is multifactorial.  We will get a CPAP download.  Would recommend optimal control of underlying sleep apnea.  Patient is on multiple sedating medications that could be contributing to her daytime sleepiness.  Unlikely this is narcolepsy/cataplexy however clinical symptoms difficult to obtain from patient .  For now we will optimize underlying sleep apnea.  Patient has been treated with Ritalin and seems to  be tolerating well from  psychiatry will continue to let psychiatry manage this.  Encouraged on healthy sleep regimen.  Decrease daytime napping and if she does take a nap use her CPAP.  Plan  Patient Instructions  Wear CPAP all night long for at least 6 or more hours Change to CPAP AutoSet 6 to 12 cm H2O Try DreamWear nasal mask Work on healthy weight Do not drive if sleepy Saline nasal spray Twice daily   Saline nasal gel At bedtime   Follow-up in 2 months and as needed '   I spent  45  minutes dedicated to the care of this patient on the date of this encounter to include pre-visit review of records, face-to-face time with the patient discussing conditions above, post visit ordering of testing, clinical documentation with the electronic health record, making appropriate referrals as documented, and communicating necessary findings to members of the patients care team.    Rubye Oaks, NP 08/18/2022

## 2022-08-18 NOTE — Progress Notes (Signed)
Reviewed and agree with assessment/plan.   Coralyn Helling, MD [facility] Pulmonary/Critical Care 08/18/2022, 3:43 PM Pager:  [phone]

## 2022-08-18 NOTE — Telephone Encounter (Signed)
Spoke to [place] with [employer] and requested download.  Will await report to be faxed.

## 2022-08-18 NOTE — Assessment & Plan Note (Addendum)
Excessive daytime sleepiness with underlying sleep apnea.  Suspect is multifactorial.  We will get a CPAP download.  Would recommend optimal control of underlying sleep apnea.  Patient is on multiple sedating medications that could be contributing to her daytime sleepiness.  Unlikely this is narcolepsy/cataplexy however clinical symptoms difficult to obtain from patient .  For now we will optimize underlying sleep apnea.  Patient has been treated with Ritalin and seems to be tolerating well from psychiatry will continue to let psychiatry manage this.  Encouraged on healthy sleep regimen.  Decrease daytime napping and if she does take a nap use her CPAP.  Plan  Patient Instructions  Wear CPAP all night long for at least 6 or more hours Change to CPAP AutoSet 6 to 12 cm H2O Try DreamWear nasal mask Work on healthy weight Do not drive if sleepy Saline nasal spray Twice daily   Saline nasal gel At bedtime   Follow-up in 2 months and as needed '

## 2022-08-18 NOTE — Patient Instructions (Addendum)
Wear CPAP all night long for at least 6 or more hours Change to CPAP AutoSet 6 to 12 cm H2O Try DreamWear nasal mask Work on healthy weight Do not drive if sleepy Saline nasal spray Twice daily   Saline nasal gel At bedtime   Follow-up in 2 months and as needed

## 2022-08-18 NOTE — Telephone Encounter (Signed)
Download received and reviewed by TP. Per Rubye Oaks, NP verbally-recommend adjusting pressure 6-12cm and order dream wear nasal mask.  Order has been placed.  Patient's spouse, Steven(DPR) is aware and voiced his understanding.  Nothing further needed.

## 2022-08-18 NOTE — Assessment & Plan Note (Addendum)
Mild obstructive sleep apnea-CPAP download shows good compliance however needs to increase daily usage.  We will adjust CPAP pressure to hopefully decrease number of events.  We will change CPAP to AutoSet 6 to 12 cm H2O.  Change over to a DreamWear nasal mask.  Plan  Patient Instructions  Wear CPAP all night long for at least 6 or more hours Change to CPAP AutoSet 6 to 12 cm H2O Try DreamWear nasal mask Work on healthy weight Do not drive if sleepy Saline nasal spray Twice daily   Saline nasal gel At bedtime   Follow-up in 2 months and as needed

## 2022-08-18 NOTE — Telephone Encounter (Signed)
Lm for Brad with adapt to request download.   

## 2022-08-18 NOTE — Telephone Encounter (Signed)
Once report has been received and reviewed by Tammy. An order will need to be placed for dream wear nasal mask.

## 2022-09-15 ENCOUNTER — Encounter: Payer: Self-pay | Admitting: Psychiatry

## 2022-09-15 ENCOUNTER — Ambulatory Visit (INDEPENDENT_AMBULATORY_CARE_PROVIDER_SITE_OTHER): Payer: Medicare Other | Admitting: Psychiatry

## 2022-09-15 VITALS — BP 135/90 | HR 80 | Temp 98.1°F | Ht 68.0 in | Wt 190.2 lb

## 2022-09-15 DIAGNOSIS — F410 Panic disorder [episodic paroxysmal anxiety] without agoraphobia: Secondary | ICD-10-CM | POA: Diagnosis not present

## 2022-09-15 DIAGNOSIS — G4733 Obstructive sleep apnea (adult) (pediatric): Secondary | ICD-10-CM

## 2022-09-15 DIAGNOSIS — F1021 Alcohol dependence, in remission: Secondary | ICD-10-CM

## 2022-09-15 DIAGNOSIS — F3341 Major depressive disorder, recurrent, in partial remission: Secondary | ICD-10-CM

## 2022-09-15 DIAGNOSIS — F0390 Unspecified dementia without behavioral disturbance: Secondary | ICD-10-CM | POA: Diagnosis not present

## 2022-09-15 DIAGNOSIS — F339 Major depressive disorder, recurrent, unspecified: Secondary | ICD-10-CM

## 2022-09-15 DIAGNOSIS — Z8659 Personal history of other mental and behavioral disorders: Secondary | ICD-10-CM

## 2022-09-15 MED ORDER — METHYLPHENIDATE HCL 10 MG PO TABS: 10.0000 mg | ORAL_TABLET | Freq: Every morning | ORAL | 0 refills | Status: DC

## 2022-09-15 MED ORDER — METHYLPHENIDATE HCL 10 MG PO TABS: 10.0000 mg | ORAL_TABLET | Freq: Every day | ORAL | 0 refills | Status: DC

## 2022-09-15 NOTE — Progress Notes (Signed)
[facility] MD OP Progress Note  09/15/2022 3:49 PM Jodi Parrish  MRN:  [id number]  Chief Complaint:  Chief Complaint  Patient presents with   Follow-up   Medication Refill   Depression   Anxiety   HPI: Jodi Parrish is a 76 year old female, Caucasian, married, lives in [place], has a history of bipolar disorder type II, major neuro cognitive disorder, panic disorder, MDD, history of alcohol use disorder in remission was evaluated in office today.  Patient as well as spouse participated in the evaluation.  Per spouse as well as patient they are currently struggling with multiple situational stressors.  They are planning to move into the independent living community at the [facility] on January 2.  Per spouse he does not have a lot of support currently with packing and getting rid of things.  He does not believe patient has been helpful since she continues to hold on to things from the past and is having a hard time letting go.  Patient otherwise reports she has noticed good change in her energy level during the day.  The Ritalin does help with that.  Although she does feel anxious and overwhelmed as noted above due to the situational stressors.  She however reports she can work on coping techniques, including deep breathing, stepping away from situations that makes her anxious, talking to someone for help when she needs it.  She reports she is compliant on her medications.  She has been having trouble getting used to her new CPAP adjustments.  She has not been using it regularly since the past week or so.  Currently under the care of pulmonology for the same.  May need a new mask.  Patient appeared to be alert, oriented to person, situation.  Patient was able to give the month as December and the day as Thursday.  She had trouble giving the date otherwise or the year.  Patient with 3 word memory immediately 3 out of 3, after 5 minutes 0 out of 3.  Patient unable to do  calculation.  Patient continues to be under the care of neurologist.  Agrees to follow-up.  Denies any other concerns today.  Visit Diagnosis:    ICD-10-CM   1. Major neurocognitive disorder (HCC)  F03.90 methylphenidate (RITALIN) 10 MG tablet   Mild    2. Panic disorder  F41.0     3. Recurrent major depressive disorder, in partial remission (HCC)  F33.41 methylphenidate (RITALIN) 10 MG tablet    methylphenidate (RITALIN) 10 MG tablet   improving    4. OSA (obstructive sleep apnea)  G47.33     5. Alcohol use disorder, severe, in sustained remission (HCC)  F10.21     6. History of bipolar disorder  Z86.59       Past Psychiatric History: Reviewed past psychiatric history from progress note on 04/28/2020.  Past trials of medications-Celexa, Prozac, Zoloft, Cymbalta, Abilify, Lamictal, mirtazapine, trazodone-multiple other medications.  Past Medical History:  Past Medical History:  Diagnosis Date   Ankle fracture    right   Anxiety    Arthritis    Bipolar disorder (HCC)    Depression    Diverticulitis    Hyperlipidemia    Hypertension    Osteoarthritis    Overactive bladder     Past Surgical History:  Procedure Laterality Date   ABDOMINAL HYSTERECTOMY  1998   BARTHOLIN GLAND CYST EXCISION  1976   COLONOSCOPY     COLONOSCOPY WITH PROPOFOL N/A  04/10/2018   Procedure: COLONOSCOPY WITH PROPOFOL;  Surgeon: Toledo, Boykin Nearing, MD;  Location: [facility];  Service: Gastroenterology;  Laterality: N/A;   ELBOW SURGERY Right 1995   crushed elbow with hardware implanted   ESOPHAGOGASTRODUODENOSCOPY (EGD) WITH PROPOFOL N/A 04/10/2018   Procedure: ESOPHAGOGASTRODUODENOSCOPY (EGD) WITH PROPOFOL;  Surgeon: Toledo, Boykin Nearing, MD;  Location: [facility] END[facility]COPY;  Service: Gastroenterology;  Laterality: N/A;   JOINT REPLACEMENT Left 2011   knee   JOINT REPLACEMENT Right 2009   elbow   KNEE SURGERY Left 2000   PELVIC FLOOR REPAIR  2009   PELVIC FLOOR LIFT   REPLACEMENT TOTAL KNEE Left  2016   TONSILLECTOMY  1958   TOTAL HIP ARTHROPLASTY Left 2008   TOTAL HIP ARTHROPLASTY Right 01/10/2017   Procedure: TOTAL HIP ARTHROPLASTY ANTERIOR APPROACH;  Surgeon: Kennedy Bucker, MD;  Location: [facility] ORS;  Service: Orthopedics;  Laterality: Right;    Family Psychiatric History: Reviewed family psychiatric history from progress note on 04/28/2020.  Family History:  Family History  Problem Relation Age of Onset   Parkinson's disease Mother    CAD Father    Hypertension Father    Anxiety disorder Daughter    Depression Daughter    Suicidality Daughter    Alcohol abuse Daughter    Anxiety disorder Daughter    Anxiety disorder Son    Breast cancer Neg Hx     Social History: Reviewed social history from progress note on 04/28/2020. Social History   Socioeconomic History   Marital status: Married    Spouse name: Not on file   Number of children: Not on file   Years of education: Not on file   Highest education level: Not on file  Occupational History   Not on file  Tobacco Use   Smoking status: Former    Types: Cigarettes   Smokeless tobacco: Never   Tobacco comments:    Smoked in college.   Vaping Use   Vaping Use: Never used  Substance and Sexual Activity   Alcohol use: Not Currently    Comment: HEAVY USE IN THE PAST- QUIT 3 YEARS AGO   Drug use: No   Sexual activity: Yes  Other Topics Concern   Not on file  Social History Narrative   Not on file   Social Determinants of Health   Financial Resource Strain: Not on file  Food Insecurity: Not on file  Transportation Needs: Not on file  Physical Activity: Not on file  Stress: Not on file  Social Connections: Not on file    Allergies:  Allergies  Allergen Reactions   Budesonide-Formoterol Fumarate     Other reaction(s): Unknown   Abilify [Aripiprazole] Anxiety    SEVERE HYPERACTIVITY   Duloxetine Anxiety    Metabolic Disorder Labs: No results found for: "HGBA1C", "MPG" No results found for:  "PROLACTIN" No results found for: "CHOL", "TRIG", "HDL", "CHOLHDL", "VLDL", "LDLCALC" Lab Results  Component Value Date   TSH 0.95 09/18/2013    Therapeutic Level Labs: No results found for: "LITHIUM" No results found for: "VALPROATE" No results found for: "CBMZ"  Current Medications: Current Outpatient Medications  Medication Sig Dispense Refill   acetaminophen (TYLENOL) 650 MG CR tablet Take 650 mg by mouth every 8 (eight) hours as needed for pain.     Cholecalciferol (VITAMIN D-3) 5000 units TABS Take 1 tablet by mouth daily.     clonazePAM (KLONOPIN) 1 MG tablet Take 0.5 tablets (0.5 mg total) by mouth daily as needed for anxiety. 15 tablet  1   folic acid (FOLVITE) 800 MCG tablet Take 400 mcg by mouth daily.     gabapentin (NEURONTIN) 100 MG capsule Take 200 mg by mouth 3 (three) times daily.     magnesium 30 MG tablet Take 250 mg by mouth daily.     pantoprazole (PROTONIX) 40 MG tablet Take 40 mg by mouth daily.     polyethylene glycol (MIRALAX / GLYCOLAX) 17 g packet Take 17 g by mouth daily.     pravastatin (PRAVACHOL) 40 MG tablet Take 40 mg by mouth at bedtime.     propranolol ER (INDERAL LA) 60 MG 24 hr capsule Take 60 mg by mouth daily. Takes differently     QUEtiapine (SEROQUEL) 100 MG tablet Take 1 tablet (100 mg total) by mouth at bedtime. 30 tablet 2   Simethicone (GAS-X EXTRA STRENGTH) 125 MG CAPS Take 125 mg by mouth. Twice a day as needed     traZODone (DESYREL) 100 MG tablet Take 1-1.5 tablets (100-150 mg total) by mouth at bedtime as needed for sleep. 45 tablet 2   TRINTELLIX 10 MG TABS tablet TAKE 1 TABLET(10 MG) BY MOUTH DAILY. STOP TRINTELLIX 20 MG 30 tablet 2   UNABLE TO FIND 1 Device by Other route as directed. 1 Device O   vitamin B-12 (CYANOCOBALAMIN) 1000 MCG tablet Take 1,000 mcg by mouth daily.     [START ON 09/20/2022] methylphenidate (RITALIN) 10 MG tablet Take 1 tablet (10 mg total) by mouth in the morning. 30 tablet 0   [START ON 10/19/2022]  methylphenidate (RITALIN) 10 MG tablet Take 1 tablet (10 mg total) by mouth daily with breakfast. 30 tablet 0   No current facility-administered medications for this visit.     Musculoskeletal: Strength & Muscle Tone: within normal limits Gait & Station: normal Patient leans: N/A  Psychiatric Specialty Exam: Review of Systems  Psychiatric/Behavioral:  The patient is nervous/anxious.   All other systems reviewed and are negative.   Blood pressure (!) 135/90, pulse 80, temperature 98.1 F (36.7 C), temperature source Oral, height 5\' 8"  (1.727 m), weight 190 lb 3.2 oz (86.3 kg).Body mass index is 28.92 kg/m.  General Appearance: Casual  Eye Contact:  Fair  Speech:  Clear and Coherent  Volume:  Normal  Mood:  Anxious  Affect:  Congruent  Thought Process:  Goal Directed and Descriptions of Associations: Intact  Orientation:  Other:  person, situation  Thought Content: Logical   Suicidal Thoughts:  No  Homicidal Thoughts:  No  Memory:  Immediate;   Fair Recent;   Poor Remote;   Poor  Judgement:  Fair  Insight:  Shallow  Psychomotor Activity:  Normal  Concentration:  Concentration: Poor and Attention Span: Poor  Recall:  Poor  Fund of Knowledge: Fair  Language: Fair  Akathisia:  No  Handed:  Right  AIMS (if indicated):  done  Assets:  Desire for Improvement Housing Intimacy Social Support Transportation  ADL's:  Intact  Cognition: baseline  Sleep:  Fair   Screenings: Office Visit from 09/15/2022 in [facility] Office Visit from 07/18/2022 in [facility] Visit from 06/20/2022 in [facility] Office Visit from 05/19/2022 in [facility] Office Visit from 04/07/2022 in [facility]  AIMS Total Score 0 0 0 0 0      GAD-7    Flowsheet Row Office Visit from 12/08/2021 in [facility]  Total  GAD-7 Score 1      PHQ2-9    Flowsheet Row [facility] from [facility]15[facility]20[facility] Office Visit from [facility]/18[facility]2022 [facility] Office Visit from [facility]/20/20[facility] Office Visit from 05/19/2022 in [facility] Office Visit from [facility]/08/2021 [facility]  PHQ-2 Total Score 4 2 1 4 1   PHQ-9 Total Score 9 4 5 16  --      Flowsheet Row Office Visit from 09[facility]15/2022 in [facility] Office Visit from [facility]2022 in [facility] Office Visit from 06/20/2022 in [facility]  C-SSRS RISK CATEGORY No Risk No Risk No Risk        Assessment and Plan: Jodi Parrish is a 76 year old Caucasian female, married, has a history of bipolar disorder type II, anxiety disorder, major neuro cognitive disorder, panic disorder, chronic pain, obstructive sleep apnea on CPAP was evaluated in office today.  Patient currently going through situational stressors of transitioning to an independent living community, which does make her anxious, however with good response to medications in general.  May benefit from psychotherapy , could consider this in the future if needed.  Patient tried this in the past and did not feel it was beneficial.  Plan as noted below.  Plan Major neuro cognitive disorder-unstable Last neuropsychological testing-06/26/2020-Dr. Clayborn HeronStewart Peter. Patient to have repeat neuropsychological testing with Dr. Margret Chanceodenbough-March 2024. Continue follow-up with neurology-Dr. Malvin JohnsPotter. Continue Ritalin 10 mg p.o. daily in the morning.  GAD-improving Gabapentin 200 mg p.o. 3 times daily Trintellix 10 mg p.o. daily Seroquel 100 mg p.o. nightly-reduced dosage Currently limiting clonazepam-prescribed as 0.5 mg as needed.  Panic disorder-improving Trintellix 10 mg  p.o. daily   MDD in partial remission Trintellix 10 mg p.o. daily Ritalin 10 mg p.o. daily in the morning Trazodone 100-150 mg p.o. nightly as needed Seroquel 100 mg at bedtime-reduced dosage due to hypersomnia.  Sleep apnea-improving Patient currently under the care of pulmonologists-recent changes were made to CPAP and may need a new mask.  Collateral information obtained from spouse as noted above.  Follow-up in clinic in 2 months or sooner if needed. This note was generated in part or whole with voice recognition software. Voice recognition is usually quite accurate but there are transcription errors that can and very often do occur. I apologize for any typographical errors that were not detected and corrected.     Jomarie LongsSaramma Sequoya Hogsett, MD 09/16/2022, 8:29 AM

## 2022-09-29 ENCOUNTER — Ambulatory Visit (INDEPENDENT_AMBULATORY_CARE_PROVIDER_SITE_OTHER): Payer: Medicare Other | Admitting: Adult Health

## 2022-09-29 ENCOUNTER — Encounter: Payer: Self-pay | Admitting: Adult Health

## 2022-09-29 VITALS — BP 104/70 | HR 76 | Temp 98.3°F | Ht 67.0 in | Wt 191.4 lb

## 2022-09-29 DIAGNOSIS — G4710 Hypersomnia, unspecified: Secondary | ICD-10-CM | POA: Diagnosis not present

## 2022-09-29 DIAGNOSIS — G4733 Obstructive sleep apnea (adult) (pediatric): Secondary | ICD-10-CM

## 2022-09-29 DIAGNOSIS — H00015 Hordeolum externum left lower eyelid: Secondary | ICD-10-CM | POA: Diagnosis not present

## 2022-09-29 DIAGNOSIS — H00019 Hordeolum externum unspecified eye, unspecified eyelid: Secondary | ICD-10-CM | POA: Insufficient documentation

## 2022-09-29 MED ORDER — TOBRAMYCIN 0.3 % OP SOLN: 1.0000 [drp] | Freq: Four times a day (QID) | OPHTHALMIC | 0 refills | Status: AC

## 2022-09-29 NOTE — Assessment & Plan Note (Signed)
Persistent hypersomnia with underlying sleep apnea.  Will try to optimally control underlying sleep apnea.  Ritalin is being managed by psychiatry.  Daytime sleepiness is much improved on Ritalin. Patient education was given  Plan  Patient Instructions  Wear CPAP all night long for at least 6 or more hours Change to CPAP 6 cm H2O.  Work on healthy weight Do not drive if sleepy Saline nasal spray Twice daily   Saline nasal gel At bedtime    Warm compresses several times a day to left eye  Tobramycin eye drops 1 drop to left eye Four times a day   for 5 days  If not healed will need to see Opthalmology Follow-up in 4 months and [facility] .

## 2022-09-29 NOTE — Assessment & Plan Note (Signed)
Mild obstructive sleep apnea with difficulty tolerating CPAP.  Will decrease CPAP pressure down to 6 again and see if this is more tolerable also see if this decreases her number of central events.  We discussed an in lab sleep study with CPAP titration.  Patient is on quite a few sedating medications which may be contributing to her central events.  They want to hold off right now on in lab titration study.  Continue to work with psychiatry regarding medications.  Plan  Patient Instructions  Wear CPAP all night long for at least 6 or more hours Change to CPAP 6 cm H2O.  Work on healthy weight Do not drive if sleepy Saline nasal spray Twice daily   Saline nasal gel At bedtime    Warm compresses several times a day to left eye  Tobramycin eye drops 1 drop to left eye Four times a day   for 5 days  If not healed will need to see Opthalmology Follow-up in 4 months and [facility] office .

## 2022-09-29 NOTE — Assessment & Plan Note (Signed)
Use warm compresses.  May try tobramycin eyedrops for 5 days.  If not improving and resolving will need to be seen by ophthalmology.  Plan  Patient Instructions  Wear CPAP all night long for at least 6 or more hours Change to CPAP 6 cm H2O.  Work on healthy weight Do not drive if sleepy Saline nasal spray Twice daily   Saline nasal gel At bedtime    Warm compresses several times a day to left eye  Tobramycin eye drops 1 drop to left eye Four times a day   for 5 days  If not healed will need to see Opthalmology Follow-up in 4 months and [facility] office .

## 2022-09-29 NOTE — Progress Notes (Signed)
@Patient  ID: , female    DOB: 01/24/1946, 77 y.o.   MRN: [id number]  Chief Complaint  Patient presents with   Follow-up    Referring provider: [id number], MD  HPI: 77 year old female seen for sleep consult August 18, 2022 to establish for sleep apnea Medical history significant for bipolar disorder, memory loss and anxiety-on multiple sedating medications including Neurontin, trazodone and Seroquel.  On Ritalin for hypersomnolence  TEST/EVENTS :  PSG Feb 09, 2021 showed mild sleep apnea with AHI 10.6/hour, REM related AHI 18.3/hour and SPO2 low at 79%   06/11/21: Carotid ultrasound bilateral IMPRESSION:  1. Mild right carotid bifurcation plaque resulting in less than 50%  diameter ICA stenosis.  2. No significant left carotid plaque or stenosis.  3. Antegrade bilateral vertebral arterial flow.   03/15/2021 MRI BRAIN WO IMPRESSION:  1. 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  2. Mild parenchymal volume loss.   08/28/20 MRI HEAD WITHOUT CONTRAST Impression: Normal brain MRI for age. No acute or reversible intracranial abnormality identified.   09/29/2022 Follow up : Sleep apnea, daytime hypersomnolence Patient returns for a 6-week follow-up.  Patient was seen last visit for a sleep consult to reestablish for sleep apnea.  Patient has been diagnosed with mild sleep apnea in the past.  In lab sleep study Feb 09, 2021 showed mild sleep apnea the AHI at 10.6/hour and SpO2 low at 79%.  Patient has had difficulty tolerating CPAP.  Last visit patient was recommended to continue on CPAP at bedtime.  CPAP pressure was changed  to 6 to 12 cm H2O.  Patient says she still having trouble wearing CPAP.  It is very uncomfortable and has not been using it.  Feel pressure is way too high. Download shows poor compliance with daily average usage at 3 hours.  Patient is on auto  CPAP 6 to 12 cm H2O.  Daily average pressure at 10.9 cm H2O.  AHI 12.2/hour.  (8/hour central events) Patient remains on multiple sedating medications including Klonopin, Neurontin, Seroquel, trazodone. Patient's daytime sleepiness has improved quite a bit feels that Ritalin is helping.  She is also cut out Klonopin quite a bit.  Rarely takes it.  And has been going down on doses of her medications that cause sleepiness.  Feels that this has really helped.  Complains of a stye on left eye for the last week.  Red and raised.  No known injury.  No visual changes.  No drainage.    Allergies  Allergen Reactions   Budesonide-Formoterol Fumarate     Other reaction(s): Unknown   Abilify [Aripiprazole] Anxiety    SEVERE HYPERACTIVITY   Duloxetine Anxiety    Immunization History  Administered Date(s) Administered   COVID-19, mRNA, vaccine(Comirnaty)12 years and older 06/25/2022   Influenza, High Dose Seasonal PF 06/14/2018, 05/20/2019, 08/26/2019, 07/02/2020, 06/16/2021, 06/25/2022   Influenza-Unspecified 07/08/2014, 07/01/2015, 07/22/2016, 06/19/2017, 06/14/2018   PFIZER Comirnaty(Gray Top)Covid-19 Tri-Sucrose Vaccine 11/13/2019, 12/04/2019, 07/02/2020, 01/04/2021   Pfizer Covid-19 Vaccine Bivalent Booster 76yrs & up 06/16/2021   Pneumococcal Conjugate-13 07/08/2015   Respiratory Syncytial Virus Vaccine,Recomb Aduvanted(Arexvy) 06/25/2022    Past Medical History:  Diagnosis Date   Ankle fracture    right   Anxiety    Arthritis    Bipolar disorder (HCC)    Depression    Diverticulitis    Hyperlipidemia    Hypertension    Osteoarthritis  Overactive bladder     Tobacco History: Social History   Tobacco Use  Smoking Status Former   Types: Cigarettes  Smokeless Tobacco Never  Tobacco Comments   Smoked in college.    Counseling given: Not Answered Tobacco comments: Smoked in college.    Outpatient Medications Prior to Visit  Medication Sig Dispense Refill    acetaminophen (TYLENOL) 650 MG CR tablet Take 650 mg by mouth every 8 (eight) hours as needed for pain.     Cholecalciferol (VITAMIN D-3) 5000 units TABS Take 1 tablet by mouth daily.     clonazePAM (KLONOPIN) 1 MG tablet Take 0.5 tablets (0.5 mg total) by mouth daily as needed for anxiety. 15 tablet 1   folic acid (FOLVITE) 956 MCG tablet Take 400 mcg by mouth daily.     gabapentin (NEURONTIN) 100 MG capsule Take 200 mg by mouth 3 (three) times daily.     magnesium 30 MG tablet Take 250 mg by mouth daily.     methylphenidate (RITALIN) 10 MG tablet Take 1 tablet (10 mg total) by mouth in the morning. 30 tablet 0   [START ON 10/19/2022] methylphenidate (RITALIN) 10 MG tablet Take 1 tablet (10 mg total) by mouth daily with breakfast. 30 tablet 0   pantoprazole (PROTONIX) 40 MG tablet Take 40 mg by mouth daily.     polyethylene glycol (MIRALAX / GLYCOLAX) 17 g packet Take 17 g by mouth daily.     pravastatin (PRAVACHOL) 40 MG tablet Take 40 mg by mouth at bedtime.     propranolol ER (INDERAL LA) 60 MG 24 hr capsule Take 60 mg by mouth daily. Takes differently     QUEtiapine (SEROQUEL) 100 MG tablet Take 1 tablet (100 mg total) by mouth at bedtime. 30 tablet 2   Simethicone (GAS-X EXTRA STRENGTH) 125 MG CAPS Take 125 mg by mouth. Twice a day as needed     traZODone (DESYREL) 100 MG tablet Take 1-1.5 tablets (100-150 mg total) by mouth at bedtime as needed for sleep. 45 tablet 2   TRINTELLIX 10 MG TABS tablet TAKE 1 TABLET(10 MG) BY MOUTH DAILY. STOP TRINTELLIX 20 MG 30 tablet 2   UNABLE TO FIND 1 Device by Other route as directed. 1 Device O   vitamin B-12 (CYANOCOBALAMIN) 1000 MCG tablet Take 1,000 mcg by mouth daily.     No facility-administered medications prior to visit.     Review of Systems:   Constitutional:   No  weight loss, night sweats,  Fevers, chills, + fatigue, or  lassitude.  HEENT:   No headaches,  Difficulty swallowing,  Tooth/dental problems, or  Sore throat,                 No sneezing, itching, ear ache, nasal congestion, post nasal drip,   CV:  No chest pain,  Orthopnea, PND, swelling in lower extremities, anasarca, dizziness, palpitations, syncope.   GI  No heartburn, indigestion, abdominal pain, nausea, vomiting, diarrhea, change in bowel habits, loss of appetite, bloody stools.   Resp: No shortness of breath with exertion or at rest.  No excess mucus, no productive cough,  No non-productive cough,  No coughing up of blood.  No change in color of mucus.  No wheezing.  No chest wall deformity  Skin: no rash or lesions.  GU: no dysuria, change in color of urine, no urgency or frequency.  No flank pain, no hematuria   MS:  No joint pain or swelling.  No decreased range of  motion.  Chronic knee pain    Physical Exam  BP 104/70 (BP Location: Left Arm, Patient Position: Sitting, Cuff Size: Large)   Pulse 76   Temp 98.3 F (36.8 C) (Oral)   Ht 5\' 7"  (1.702 m)   Wt 191 lb 6.4 oz (86.8 kg)   SpO2 97%   BMI 29.98 kg/m   GEN: A/Ox3; pleasant , NAD, well nourished    HEENT:  Strong/AT,  NOSE-clear, THROAT-clear, no lesions, no postnasal drip or exudate noted.  Positive lower lid hordeolum on left-notable redness.  NECK:  Supple w/ fair ROM; no JVD; normal carotid impulses w/o bruits; no thyromegaly or nodules palpated; no lymphadenopathy.    RESP  Clear  P & A; w/o, wheezes/ rales/ or rhonchi. no accessory muscle use, no dullness to percussion  CARD:  RRR, no m/r/g, no peripheral edema, pulses intact, no cyanosis or clubbing.  GI:   Soft & nt; nml bowel sounds; no organomegaly or masses detected.   Musco: Warm bil, no deformities or joint swelling noted.   Neuro: alert, no focal deficits noted.    Skin: Warm, no lesions or rashes    Lab Results:   BMET   BNP No results found for: "BNP"  ProBNP No results found for: "PROBNP"  Imaging: No results found.        No data to display          No results found for:  "NITRICOXIDE"      Assessment & Plan:   OSA (obstructive sleep apnea) Mild obstructive sleep apnea with difficulty tolerating CPAP.  Will decrease CPAP pressure down to 6 again and see if this is more tolerable also see if this decreases her number of central events.  We discussed an in lab sleep study with CPAP titration.  Patient is on quite a few sedating medications which may be contributing to her central events.  They want to hold off right now on in lab titration study.  Continue to work with psychiatry regarding medications.  Plan  Patient Instructions  Wear CPAP all night long for at least 6 or more hours Change to CPAP 6 cm H2O.  Work on healthy weight Do not drive if sleepy Saline nasal spray Twice daily   Saline nasal gel At bedtime    Warm compresses several times a day to left eye  Tobramycin eye drops 1 drop to left eye Four times a day   for 5 days  If not healed will need to see Opthalmology Follow-up in 4 months and as needed office .     Hypersomnia Persistent hypersomnia with underlying sleep apnea.  Will try to optimally control underlying sleep apnea.  Ritalin is being managed by psychiatry.  Daytime sleepiness is much improved on Ritalin. Patient education was given  Plan  Patient Instructions  Wear CPAP all night long for at least 6 or more hours Change to CPAP 6 cm H2O.  Work on healthy weight Do not drive if sleepy Saline nasal spray Twice daily   Saline nasal gel At bedtime    Warm compresses several times a day to left eye  Tobramycin eye drops 1 drop to left eye Four times a day   for 5 days  If not healed will need to see Opthalmology Follow[facility]up in [facility] months and [facility] .     Hordeolum Use warm compresses.  May try tobramycin eyedrops for 5 days.  If not improving and resolving will need  to be seen by ophthalmology.  Plan  Patient Instructions  Wear CPAP all night long for at least 6 or more hours Change  to CPAP 6 cm H2O.  Work on healthy weight Do not drive if sleepy Saline nasal spray Twice daily   Saline nasal gel At bedtime    Warm compresses several times a day to left eye  Tobramycin eye drops 1 drop to left eye Four times a day   for 5 days  If not healed will need to see Opthalmology Follow-up in 4 months and [facility] .       Rexene Edison, NP 09/29/2022

## 2022-09-29 NOTE — Patient Instructions (Addendum)
Wear CPAP all night long for at least 6 or more hours Change to CPAP 6 cm H2O.  Work on healthy weight Do not drive if sleepy Saline nasal spray Twice daily   Saline nasal gel At bedtime    Warm compresses several times a day to left eye  Tobramycin eye drops 1 drop to left eye Four times a day   for 5 days  If not healed will need to see Opthalmology Follow-up in 4 months and [facility] .

## 2022-09-29 NOTE — Progress Notes (Signed)
Reviewed and agree with assessment/plan.   Chesley Mires, MD [facility] Pulmonary/Critical Care 09/29/2022, 4:35 PM Pager:  [phone]

## 2022-10-03 ENCOUNTER — Other Ambulatory Visit: Payer: Self-pay | Admitting: Psychiatry

## 2022-10-03 DIAGNOSIS — F339 Major depressive disorder, recurrent, unspecified: Secondary | ICD-10-CM

## 2022-10-11 ENCOUNTER — Other Ambulatory Visit: Payer: Self-pay | Admitting: Psychiatry

## 2022-10-11 DIAGNOSIS — Z8659 Personal history of other mental and behavioral disorders: Secondary | ICD-10-CM

## 2022-10-11 DIAGNOSIS — F410 Panic disorder [episodic paroxysmal anxiety] without agoraphobia: Secondary | ICD-10-CM

## 2022-10-11 DIAGNOSIS — F0390 Unspecified dementia without behavioral disturbance: Secondary | ICD-10-CM

## 2022-10-23 ENCOUNTER — Other Ambulatory Visit: Payer: Self-pay | Admitting: Psychiatry

## 2022-10-23 DIAGNOSIS — F339 Major depressive disorder, recurrent, unspecified: Secondary | ICD-10-CM

## 2022-11-03 ENCOUNTER — Encounter: Payer: Self-pay | Admitting: Psychiatry

## 2022-11-03 ENCOUNTER — Ambulatory Visit (INDEPENDENT_AMBULATORY_CARE_PROVIDER_SITE_OTHER): Payer: Medicare Other | Admitting: Psychiatry

## 2022-11-03 VITALS — BP 161/98 | HR 69 | Temp 98.1°F | Ht 67.0 in | Wt 184.4 lb

## 2022-11-03 DIAGNOSIS — G4733 Obstructive sleep apnea (adult) (pediatric): Secondary | ICD-10-CM

## 2022-11-03 DIAGNOSIS — F410 Panic disorder [episodic paroxysmal anxiety] without agoraphobia: Secondary | ICD-10-CM | POA: Diagnosis not present

## 2022-11-03 DIAGNOSIS — F0390 Unspecified dementia without behavioral disturbance: Secondary | ICD-10-CM | POA: Diagnosis not present

## 2022-11-03 DIAGNOSIS — F1021 Alcohol dependence, in remission: Secondary | ICD-10-CM

## 2022-11-03 DIAGNOSIS — R030 Elevated blood-pressure reading, without diagnosis of hypertension: Secondary | ICD-10-CM

## 2022-11-03 DIAGNOSIS — F3341 Major depressive disorder, recurrent, in partial remission: Secondary | ICD-10-CM | POA: Diagnosis not present

## 2022-11-03 DIAGNOSIS — Z8659 Personal history of other mental and behavioral disorders: Secondary | ICD-10-CM

## 2022-11-03 MED ORDER — CLONAZEPAM 0.5 MG PO TABS: 0.5000 mg | ORAL_TABLET | Freq: Every day | ORAL | 0 refills | Status: DC | PRN

## 2022-11-03 MED ORDER — TRAZODONE HCL 100 MG PO TABS: 100.0000 mg | ORAL_TABLET | Freq: Every evening | ORAL | 2 refills | Status: DC | PRN

## 2022-11-03 NOTE — Patient Instructions (Signed)
Stop Ritalin since Blood pressure is high.Please follow up with Primary care.

## 2022-11-03 NOTE — Progress Notes (Signed)
[facility] MD OP Progress Note  11/03/2022 12:53 PM Jodi Parrish  MRN:  [id number]  Chief Complaint:  Chief Complaint  Patient presents with   Follow-up   Depression   Anxiety   Memory Loss   Medication Refill   HPI: Jodi Parrish is a 77 year old Caucasian female, married, currently lives in [place] [place] [facility] living community, has a history of major neurocognitive disorder, panic disorder, MDD, history of alcohol use disorder in remission, was evaluated in office today.  Patient being a limited historian majority of information obtained from spouse, Jodi Parrish.  They were interviewed separately.  Patient today appeared to be alert and oriented to situation and self. Gave the season as   'spring '  could not give the month, the day, the year.  3 word memory immediate 3 out of 3, after 5 minutes 2 out of 3.  Patient was able to spell the word' WORLD' forward.  Patient kept repeating that she has been anxious and overwhelmed recently.  She reports she feels overwhelmed at her new home to which she moved into.  She still has a lot of work to do with [employer].  Other than that patient could not give any details about her mood, sleep or anything else.  Denied any side effects to medications.  Patient denied any suicidality, homicidality or perceptual disturbances.  Collateral information obtained from spouse who reported that he is currently having a lot of trouble getting adjusted to the new place since she continues to need a lot of support.  She clings on to him all the time and he is unable to get out going to the dining area or go with friends.  Even at night she watches TV when he wants to sleep and that has been affecting his sleep as well.  She is currently not using her CPAP, since she has mask problem.  They have not been able to follow-up with pulmonology yet although they were supposed to.  It has been overwhelming since they had to move beginning of January and  continues to have a lot of work pending.  She feels overwhelmed when she is around a lot of people and so he is bringing her food from the dining area often.  It has been a big adjustment for patient.  Does not know if the Ritalin is really helpful at this time since she does not seem like she is able to focus and her memory seems to be getting worse.  Neurologist recently added Aricept.  She also has other physical problems including GI issues although they were able to schedule a gastroenterology visit, at the time of the visit patient denied any problems and hence she did not get the right help.  However after she returned home she started having GI issues again.'    Visit Diagnosis:    ICD-10-CM   1. Major neurocognitive disorder ([place])  F03.90    mild to moderate    2. Panic disorder  F41.0 clonazePAM (KLONOPIN) 0.5 MG tablet    3. Recurrent major depressive disorder, in partial remission (HCC)  F33.41 traZODone (DESYREL) 100 MG tablet    4. OSA (obstructive sleep apnea)  G47.33     5. Elevated blood pressure reading  R03.0     6. Alcohol use disorder, severe, in sustained remission ([place])  F10.21     7. History of bipolar disorder  Z86.59       Past Psychiatric History: Reviewed past  psychiatric history from progress note on 04/28/2020.  Past trials of medications-Celexa, Prozac, Zoloft, Cymbalta, Abilify, Lamictal, mirtazapine, trazodone-multiple other medications.  Past Medical History:  Past Medical History:  Diagnosis Date   Ankle fracture    right   Anxiety    Arthritis    Bipolar disorder ([place])    Depression    Diverticulitis    Hyperlipidemia    Hypertension    Osteoarthritis    Overactive bladder     Past Surgical History:  Procedure Laterality Date   ABDOMINAL HYSTERECTOMY  1998   BARTHOLIN GLAND CYST EXCISION  1976   COLONOSCOPY     COLONOSCOPY WITH PROPOFOL N/A 04/10/2018   Procedure: COLONOSCOPY WITH PROPOFOL;  Surgeon: Toledo, Benay Pike, MD;  Location:  [facility] ENDOSCOPY;  Service: Gastroenterology;  Laterality: N/A;   ELBOW SURGERY Right 1995   crushed elbow with hardware implanted   ESOPHAGOGASTRODUODENOSCOPY (EGD) WITH PROPOFOL N/A 04/10/2018   Procedure: ESOPHAGOGASTRODUODENOSCOPY (EGD) WITH PROPOFOL;  Surgeon: Toledo, Benay Pike, MD;  Location: [facility] ENDOSCOPY;  Service: Gastroenterology;  Laterality: N/A;   JOINT REPLACEMENT Left 2011   knee   JOINT REPLACEMENT Right 2009   elbow   KNEE SURGERY Left 2000   PELVIC FLOOR REPAIR  2009   PELVIC FLOOR LIFT   REPLACEMENT TOTAL KNEE Left 2016   TONSILLECTOMY  1958   TOTAL HIP ARTHROPLASTY Left 2008   TOTAL HIP ARTHROPLASTY Right 01/10/2017   Procedure: TOTAL HIP ARTHROPLASTY ANTERIOR APPROACH;  Surgeon: Hessie Knows, MD;  Location: [facility] ORS;  Service: Orthopedics;  Laterality: Right;    Family Psychiatric History: Reviewed family psychiatric history from progress note on 04/28/2020.  Family History:  Family History  Problem Relation Age of Onset   Parkinson's disease Mother    CAD Father    Hypertension Father    Anxiety disorder Daughter    Depression Daughter    Suicidality Daughter    Alcohol abuse Daughter    Anxiety disorder Daughter    Anxiety disorder Son    Breast cancer Neg Hx     Social History: Reviewed social history from progress note on 04/28/2020. Social History   Socioeconomic History   Marital status: Married    Spouse name: Not on file   Number of children: Not on file   Years of education: Not on file   Highest education level: Not on file  Occupational History   Not on file  Tobacco Use   Smoking status: Former    Types: Cigarettes   Smokeless tobacco: Never   Tobacco comments:    Smoked in college.   Vaping Use   Vaping Use: Never used  Substance and Sexual Activity   Alcohol use: Not Currently    Comment: HEAVY USE IN THE PAST- QUIT 3 YEARS AGO   Drug use: No   Sexual activity: Yes  Other Topics Concern   Not on file  Social History Narrative    Not on file   Social Determinants of Health   Financial Resource Strain: Not on file  Food Insecurity: Not on file  Transportation Needs: Not on file  Physical Activity: Not on file  Stress: Not on file  Social Connections: Not on file    Allergies:  Allergies  Allergen Reactions   Budesonide-Formoterol Fumarate     Other reaction(s): Unknown   Abilify [Aripiprazole] Anxiety    SEVERE HYPERACTIVITY   Duloxetine Anxiety    Metabolic Disorder Labs: No results found for: "HGBA1C", "MPG" No results found for: "PROLACTIN"  No results found for: "CHOL", "TRIG", "HDL", "CHOLHDL", "VLDL", "LDLCALC" Lab Results  Component Value Date   TSH 0.95 09/18/2013    Therapeutic Level Labs: No results found for: "LITHIUM" No results found for: "VALPROATE" No results found for: "CBMZ"  Current Medications: Current Outpatient Medications  Medication Sig Dispense Refill   acetaminophen (TYLENOL) 650 MG CR tablet Take 650 mg by mouth every 8 (eight) hours as needed for pain.     Cholecalciferol (VITAMIN D-3) 5000 units TABS Take 1 tablet by mouth daily.     clonazePAM (KLONOPIN) 0.5 MG tablet Take 1 tablet (0.5 mg total) by mouth daily as needed for anxiety. 30 tablet 0   donepezil (ARICEPT) 5 MG tablet Take 1 tablet by mouth at bedtime.     folic acid (FOLVITE) [id number] MCG tablet Take 400 mcg by mouth daily.     gabapentin (NEURONTIN) 100 MG capsule Take 200 mg by mouth 3 (three) times daily.     magnesium 30 MG tablet Take 250 mg by mouth daily.     pantoprazole (PROTONIX) 40 MG tablet Take 40 mg by mouth daily.     polyethylene glycol (MIRALAX / GLYCOLAX) 17 g packet Take 17 g by mouth daily.     pravastatin (PRAVACHOL) 40 MG tablet Take 40 mg by mouth at bedtime.     propranolol ER (INDERAL LA) 60 MG 24 hr capsule Take 60 mg by mouth daily. Takes differently     QUEtiapine (SEROQUEL) 100 MG tablet TAKE 1 TABLET(100 MG) BY MOUTH AT BEDTIME 30 tablet 2   Simethicone (GAS-X EXTRA STRENGTH)  125 MG CAPS Take 125 mg by mouth. Twice a day as needed     tobramycin (TOBREX) 0.3 % ophthalmic solution Place 1 drop into the left eye every 6 (six) hours. 5 mL 0   TRINTELLIX 10 MG TABS tablet TAKE 1 TABLET(10 MG) BY MOUTH DAILY. STOP TRINTELLIX 20 MG 30 tablet 2   UNABLE TO FIND 1 Device by Other route as directed. 1 Device O   vitamin B-12 (CYANOCOBALAMIN) 1000 MCG tablet Take 1,000 mcg by mouth daily.     traZODone (DESYREL) 100 MG tablet Take 1-1.5 tablets (100-150 mg total) by mouth at bedtime as needed for sleep. 45 tablet 2   No current facility-administered medications for this visit.     Musculoskeletal: Strength & Muscle Tone: within normal limits Gait & Station: normal Patient leans: N/A  Psychiatric Specialty Exam: Review of Systems  Unable to perform ROS: Psychiatric disorder    Blood pressure (!) 161/98, pulse 69, temperature 98.1 F (36.7 C), temperature source Skin, height 5' 7"$  (1.702 m), weight 184 lb 6.4 oz (83.6 kg).Body mass index is 28.88 kg/m.  General Appearance: Casual  Eye Contact:  Fair  Speech:  Clear and Coherent  Volume:  Normal  Mood:  Anxious  Affect:  Congruent  Thought Process:  Goal Directed and Descriptions of Associations: Intact  Orientation:  Other:  Self situation  Thought Content: Logical   Suicidal Thoughts:  No  Homicidal Thoughts:  No  Memory:  Immediate;   Fair Recent;   Poor Remote;   Poor  Judgement:  Fair  Insight:  Shallow  Psychomotor Activity:  Normal  Concentration:  Concentration: Poor and Attention Span: Poor  Recall:   Limited  Fund of Knowledge: Fair  Language: Fair  Akathisia:  No  Handed:  Right  AIMS (if indicated): done  Assets:  Desire for Improvement Social Support Transportation  ADL's:  Intact  Cognition: Baseline  Sleep:   Restless due to not using CPAP   Screenings: [place] Office Visit from 11/03/2022 in [place] Office Visit from  09/15/2022 in [place] Office Visit from 07/18/2022 in [place] Office Visit from 06/20/2022 in [place] Office Visit from 05/19/2022 in [place] Total Score 0 0 0 0 0      [place] Office Visit from 11/03/2022 in [place] Office Visit from 12/08/2021 in [place]  Total GAD-7 Score 8 1      PHQ2-9    [place] Office Visit from 11/03/2022 in [place] Office Visit from 09/15/2022 in [place] Office Visit from 07/18/2022 in [place] Office Visit from 06/20/2022 in [place] Office Visit from 05/19/2022 in [place]  PHQ-2 Total Score 1 4 2 1 4  $ PHQ-9 Total Score 7 9 4 5 16      $ [place] Office Visit from 11/03/2022 in [place] Office Visit from 09/15/2022 in [place] Office Visit from 07/18/2022 in [facility]  C-SSRS RISK CATEGORY No Risk No Risk No Risk        Assessment and Plan: Jodi Parrish is a 77 year old Caucasian female, married, has a history of major neurocognitive disorder, MDD, panic disorder, chronic pain, obstructive sleep apnea noncompliant on CPAP was evaluated in office today.  Patient is currently relocated to senior living community going through adjustment phase, currently feels overwhelmed, memory seems to be worsening as well as patient has elevated blood pressure reading in session, will benefit from the following plan.  Plan Major  neurocognitive disorder-unstable Neuropsychological testing-06/26/2020-Dr. Nicole Kindred. Patient scheduled for Neuropsychological testing-Dr. Lynda Rainwater 2024 Continue follow-up with neurology-Dr. Potter-recently started on Aricept 5 mg p.o. daily Discontinue Ritalin.  GAD-unstable Patient to start psychotherapy-Per spouse they are in contact with the social worker who will start therapy agrees to schedule an appointment Gabapentin 200 mg p.o. 3 times daily Trintellix 10 mg p.o. daily Seroquel 100 mg p.o. nightly-reduced dosage Clonazepam 0.5 mg as needed.  Patient could also use half tablet if needed, advised to limit use. Reviewed [place] PMP AWARxE  MDD-unstable Will not make any further medication changes at this time. Continue current Trintellix, Seroquel. Patient to start CBT. Could consider referral for TMS/ketamine in the future-unknown if patient will be a good candidate.  Sleep apnea-unstable Patient is noncompliant with CPAP Agrees to get in touch with pulmonology  Elevated blood pressure reading-unstable Blood pressure repeated several times, will discontinue Ritalin.  Advised to follow-up with Dr. Ouida Sills for management and to let writer know.  Collateral information obtained from spouse as noted above.  Follow-up in clinic in 8 weeks or sooner in person.  This note was generated in part or whole with voice recognition software. Voice recognition is usually quite accurate but there are transcription errors that can and very often do occur. I apologize for any typographical errors that were not detected and corrected.     Ursula Alert, MD 11/04/2022, 7:47 AM

## 2022-11-04 ENCOUNTER — Telehealth: Payer: Self-pay | Admitting: Adult Health

## 2022-11-04 DIAGNOSIS — G4733 Obstructive sleep apnea (adult) (pediatric): Secondary | ICD-10-CM

## 2022-11-07 NOTE — Telephone Encounter (Signed)
Called and spoke with [facility]. Mallie Mussel stated that patients CPAP settings still hasn't been changed. Mallie Mussel stated that patient hasn't been able to wear her CPAP since last week because the settings have not been changed yet. Mallie Mussel also stated that he wanted to know if there was any way that the patient could be switched to the [place] office and see someone there instead of here because the [place] office is closer to them.   TP, please advise.

## 2022-11-08 NOTE — Telephone Encounter (Signed)
Yes can change to [facility] office for follow up.  Last note was to change CPAP pressure to 6 cm . Can we look to see why this was not done at DME .

## 2022-11-11 NOTE — Telephone Encounter (Signed)
Order was placed 08/18/22 for pt's settings to be changed to 6-12cm. No order was placed during pt's OV 09/29/22. I have placed an order for pt's settings to be changed to 6cm. Routing this to Tammy as an FYI so she is aware why the settings had not been changed.

## 2022-11-28 ENCOUNTER — Encounter: Payer: Medicare Other | Attending: Psychology | Admitting: Psychology

## 2022-11-28 DIAGNOSIS — F0390 Unspecified dementia without behavioral disturbance: Secondary | ICD-10-CM | POA: Insufficient documentation

## 2022-11-28 DIAGNOSIS — G4733 Obstructive sleep apnea (adult) (pediatric): Secondary | ICD-10-CM | POA: Diagnosis not present

## 2022-11-28 DIAGNOSIS — G894 Chronic pain syndrome: Secondary | ICD-10-CM | POA: Insufficient documentation

## 2022-11-28 DIAGNOSIS — F410 Panic disorder [episodic paroxysmal anxiety] without agoraphobia: Secondary | ICD-10-CM | POA: Insufficient documentation

## 2022-11-28 DIAGNOSIS — F3341 Major depressive disorder, recurrent, in partial remission: Secondary | ICD-10-CM | POA: Insufficient documentation

## 2022-11-29 ENCOUNTER — Encounter: Payer: Self-pay | Admitting: Psychology

## 2022-11-29 NOTE — Progress Notes (Signed)
Neuropsychological Consultation   Patient:   Jodi Parrish   DOB:   26-May-1946  MR Number:  [id number]  Location:  [facility] [place], [place] [id number] [place] [address] Dept: [phone]           Date of Service:   11/28/2022  Location of Service and Individuals present: Today's visit was an in person visit that was conducted in my outpatient clinic office with the patient, her husband and myself present.  1 hour and 30 minutes was spent in the face-to-face clinical interview and the other 30 minutes was spent with records review, report writing and setting up testing protocols.  Start Time:   3 PM End Time:   5 PM  Patient Consent and Confidentiality: Patient and husband both were reviewed issues of consent and confidentiality at the beginning of her visit today and consented for the appointment and were made aware of confidentiality issues with patient's neuropsychological evaluation ultimately being available in her EMR and records being available to her treating psychiatrist and other treating physicians.  Patient consented as well as husband consented.  Patient did demonstrate some confusion and clearly has memory and cognitive difficulties but appears to remain competent to make these decisions but the decision was also concurred by her husband.  Consent for Evaluation and Treatment:  Signed:  Yes Explanation of Privacy Policies:  Signed:  Yes Discussion of Confidentiality Limits:  Yes  Provider/Observer:  Ilean Skill, Psy.D.       Clinical Neuropsychologist       Billing Code/Service: [phone]  Chief Complaint:     Chief Complaint  Patient presents with   Memory Loss   Depression   Other    Geographic disorientation    Reason for Service:    Desare Diers is a 77 year old female referred for neuropsychological evaluation by her treating psychiatrist  Ursula Alert, MD due to ongoing and progressing cognitive difficulties with previous diagnosis of major neurocognitive disorder back in 2021 and longstanding history of depression, anxiety symptoms with panic events.  Patient has also been diagnosed with obstructive sleep apnea and has had difficulties recently with her CPAP machine around setting and difficulties with the mask.  She reports that they have been working on settings recently.  Patient has significant past medical history including previous diagnosis of major neurocognitive disorder made by Dr. 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  The patient has been followed by Ursula Alert, MD for psychiatric follow-up with previous diagnosis of depression and anxiety with previous diagnosis of bipolar affective disorder, alcohol use disorder in remission, panic disorder and major neurocognitive disorder.  The patient continues to take clonazepam, Seroquel, trazodone and Trintellix as prescribed by her attending psychiatrist.  Patient was also recently started on Aricept by her neurologist Dr. Melrose Nakayama and the patient's recent past prescription of Ritalin was discontinued.  On most recent psychiatry appointment the patient did appear alert and oriented but was inaccurate as to season and could not give month, day, year.  3 of 3 words initially and 2 of 3 words recalled after  delay.  Patient showed similar pattern as she did during today's clinical interview with repeating how anxious and overwhelmed she has felt but  Stipulating today how comfortable she felt in her meeting with me and how surprised she was as she typically is quite anxious and overwhelmed when meeting with her doctors.  The patient had difficulty describing her mood, sleep or other aspects during the visit with Dr. Shea Evans and today she also had significant difficulty organizing her thoughts to allow her to describe her most recent mood state and cognitive functioning.  Patient has been reporting feeling "overwhelmed" for essentially their entire time since they moved to this area.  This was more than 4 years ago when they moved to the area to be close to their daughters.  In medical records reviewed by both Dr. Nicole Kindred, Dr. Shea Evans, and others the patient has constantly had trouble organizing and unpacking their belongings for both of moved to [facility] as well as the most recent move into a progressive independent living situation.  During the clinical interview today the patient's husband reports that they have struggled particularly with the patient's ability to agree to get rid of various possessions and they still have a garage full of packed up items they have not gone through.  The patient has insisted on keeping her car even though she has agreed to not drive and has not driven for quite some time.  The patient has had difficulty letting go and even greater difficulty unpacking and going through items and making decisions.  These aspects of change have caused a great deal of stress between the patient and her husband and the patient's husband has fear doing things without his wife's consent to avoid her getting upset and angry.  The patient has been very angry about not driving in the past and blamed her husband even though this was an instructed decision by Dr. Nicole Kindred back in 2021.  The patient's husband is taking over almost all IADLs in the household but either defers to his wife often or avoids the subject matter completely to avoid conflict.   During the clinical interview today, the patient described moving to [place] and this area at 4 years ago to be near their daughters.  They stayed in the home for 3 years and are now transition to an assisted living facility and the patient has "still working on organizing their living situation."  The patient acknowledges that after they move that she just "stopped" doing everything attributing it to not knowing the neighbors, loss of social connection.  The patient's oldest daughter took her own life 10 years ago and the patient has continued to have struggles with that.  The patient reports that she is just stayed in the house or their apartment and the patient reports that her husband spends time just taking care of the house.  While IADLs are performed by the patient's husband the patient reports that she continues to take care of her ADLs.  Patient reports that over the last several years she has had a significant decrease in feelings of joy and happiness and she spends much of her day watching TV while her husband takes care of everything.  Patient reports that she has very little responsibilities in their home.  While patient reports that she has begun trying to get out and do more activities in their assisted living program the patient's husband reports that she avoids going down to the facility prepared group meals and insist  that he go get the food and bring it back to their apartment.  The patient has been noted to be extremely distractible and difficulty with motivation, following through on projects and admits to having extreme difficulty getting anything accomplished.  This distractibility and perseverative responses on some specific aspects was clearly noted during today's clinical interview.  The patient has been unable to organize or unpack her belongings with each move over the past several years.  Clinical records indicate that these difficulties have been present since around 12/26/13.   Memory difficulties were not related to rapid forgetting or deficits within coding or immediate recall but quickly loss of information over intermediate delay.  The patient has been noted on multiple occasions to have difficulty with year, season and day/week.  There have not been specific changes in behavioral style other than increasing anxiety and depressive symptoms.  No changes in personality style.  There is a great deal of avoidance behavior noted.  Changes in cognition were noted to be most pronounced around December 27, 2006 with the moved from [facility].  During the clinical interview today, the patient's husband reports that he has noted worsening and progression in the patient's memory difficulties and that the patient has had increasing difficulty staying interested in wanting to do things.  Motivation is been a significant change.  Her husband Richardson Landry reports that she just does not want to do things and becomes more easily tired.  Patient does attribute much of this to pain and has been describing not only neck and back pain but more recently gastrointestinal pain.  There have been difficulties with geographic orientation and the patient has gotten lost and turned around when returning from activities at their assisted living campus.  Patient is not driving now to be able to assess her ability to navigate in the world.  Patient's husband describes significant difficulty letting go of possessions with the move.  GI pain and distress was present prior to the initiation of Aricept.  Patient has been followed by pulmonology with diagnosis of mild obstructive sleep apnea and difficulty tolerating CPAP.  They have been working on reducing pressure to see if it is more tolerable and if it continues to decrease the number of obstructive events noted.  Patient was last seen by Gurney Maxin, MD, who is her treating neurologist through the [facility] clinic in [place] on 10/12/2022.  Mental status exam  performed during that evaluation was 16 of 30 with symptoms felt to be most consistent with moderate dementia.  It was this appointment where Aricept was started at 5 mg at night with consideration of increasing to 10 mg at night.  Continued recommendations of the patient not driving due to her previous memory evaluations and clinical symptoms presented on that visit.  Neurologist noted difficulties coping with move to the independent living facility the [facility].  The patient first saw Dr. Melrose Nakayama on 08/17/2020 for reference in EMR.  The patient has had several brain imaging procedures and most recent MRI was conducted on 03/15/2021 with clinical impressions by Dr. Debbrah Alar including mild nonspecific T2 hyperintense lesions of the white matter, most likely related to chronic microangiopathy/small vessel ischemia with mild volume loss.  Patient had a formal neuropsychological evaluation performed by Dr. Nicole Kindred in 12/27/2019.  Dr. Nicole Kindred noted that the patient's test taking behavior was marked with difficulties maintaining attention and mental set challenging interpretation of obtain data but did feel that interpretive information could be derived from testing material.  Patient's  most noted impairments were deficits in processing speed and executive functioning, amnestic/memory deficits, poor semantic fluency and deficits in confrontational naming.  Patient was felt to need a criterion for major neurocognitive disorder and patterns consistent with those typically seen with cortical dementia such as Alzheimer's disease.  It should be noted that the patient has not demonstrated any tremors or other motor deficits but Dr. Nicole Kindred did note there were major confounding variables including chronic pain, significant attentional issues and questions at times of effort on testing related to anxiety and a longstanding psychiatric history with panic events, mood disorder etc.  Below I have included the roll test  scores that were obtained during the neuropsychological evaluation in 2021 by Dr. Nicole Kindred.  As Dr. Nicole Kindred has left the [facility] neurology clinic she was referred to me for follow-up.  We will perform repeat testing for direct comparisons to the 2021 results for refinement of diagnostic considerations and therefore I will include the raw test data below for convenience.  06/17/2020:  TEST SCORES:      Note: This summary of test scores accompanies the interpretive report and should not be interpreted by unqualified individuals or in isolation without reference to the report. Test scores are relative to age, gender, and educational history as available and appropriate.   Performance Validity             ACS: Raw Descriptor      Word Choice: 41 Below Expectation                MSVT: Raw Descriptor      IR 85 Below Expectation      DR 85 Below Expectation      CNS 80 Below Expectation      PA 0 ---      FR 0 ---         The Dot Counting Test: Raw Descriptor      E-Score 16 Within Expectation         Embedded Measures: Raw Descriptor      RBANS Effort Index: 3 Within Expectation      WAIS-IV Reliable Digit Span 11 Within Expectation      WAIS-IV Reliable Digit Span Revised 14 Within Expectation         Expected Functioning             Wide Range Achievement Test (Word Reading): Standard/Scaled Score Percentile       Word Reading 107 68         Cognitive Testing             RBANS, Form : Standard/Scaled Score Percentile  Total Score 53 <1  Immediate Memory 49 <1      List Learning 1 <1      Story Memory 3 1  Visuospatial/Constructional 78 7      Figure Copy   (18) 10 50      Judgment of Line Orientation   (7) --- <2  Language 57 <1      Picture Naming --- <2      Semantic Fluency 4 2  Attention 79 8      Digit Span 10 50      Coding 3 1  Delayed Memory 44 <1      List Recall   (0) --- <2      List Recognition   (13) --- <2      Story Recall   (0) 1 <1      Figure Recall    (  3) 3 1         Wechsler Adult Intelligence Scale - IV: Standard/Scaled Score Percentile  Working Memory Index 86 18      Digit Span 9 37          Digit Span Forward 13 84          Digit Span Backward 10 50          Digit Span Sequencing 5 5      Arithmetic 6 9  Processing Speed Index 62 1      Symbol Search 2 <1      Coding 4 2         Neuropsychological Assessment Battery (Language Module): T-score Percentile      Naming   (12) 19 <1         Verbal Fluency: T-score Percentile      Controlled Oral Word Association (F-A-S) 47 38      Semantic Fluency (Animals) 7 <1         Trail Making Test: T-Score Percentile      Part A 34 5      Part B 14 <1         Modified Wisconsin Card Sorting Test (MWCST): Standard/T-Score Percentile      Number of Categories Correct 19 <1      Number of Perseverative Errors 34 5      Number of Total Errors 25 1      Percent Perseverative Errors 46 34  Executive Function Composite 61 <1         Boston Diagnostic Aphasia Exam: Raw Score Scaled Score      Complex Ideational Material 9 5         Rating Scales               Raw Score Descriptor  Patient Health Questionnaire - 9 7 Mild  GAD-7 15 Severe         Quick Dementia Rating System Raw Score Descriptor      Sum of Boxes 4.5 Mild Dementia      Total Score 10.5 Mild Dementia         Medical History:   Past Medical History:  Diagnosis Date   Ankle fracture    right   Anxiety    Arthritis    Bipolar disorder (HCC)    Depression    Diverticulitis    Hyperlipidemia    Hypertension    Osteoarthritis    Overactive bladder          Patient Active Problem List   Diagnosis Date Noted   Hordeolum 09/29/2022   Hypersomnia 06/20/2022   At risk for prolonged QT interval syndrome 05/19/2022   Moderate episode of recurrent major depressive disorder ([place]) 12/08/2021   Carotid artery disease ([place]) 08/05/2021   MDD (major depressive disorder), recurrent, in partial remission ([place])  06/01/2021   OSA (obstructive sleep apnea) 06/01/2021   MDD (major depressive disorder), recurrent, in full remission ([place]) 06/01/2021   Elevated blood pressure reading 06/01/2021   Prediabetes 02/02/2021   Major depressive disorder, recurrent episode with mixed features ([place]) 12/24/2020   Sleep disorder 12/24/2020   Fall 12/04/2020   MDD (major depressive disorder), recurrent episode, mild ([place]) 08/20/2020   Loss of memory 08/19/2020   Syncope 08/19/2020   Panic disorder 04/28/2020   Major neurocognitive disorder ([place]) 04/28/2020   History of bipolar disorder 04/28/2020   Alcohol use disorder, severe, in sustained remission ([place]) 04/28/2020  Osteoarthritis involving multiple joints 04/13/2020   Insomnia due to mental condition 04/13/2020   Elevated C-reactive protein (CRP) 04/13/2020   Cervical spondylosis without myelopathy 04/13/2020   DDD (degenerative disc disease), cervical 04/13/2020   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, C5-6) (Left) 04/13/2020   Cervical kyphosis 04/13/2020   Osteoarthritis of glenohumeral joints (Bilateral) 04/13/2020   Osteoarthritis of AC (acromioclavicular) joint (Right) 04/13/2020   Thoracic scoliosis (Right) 04/13/2020   Thoracic spondylosis without myelopathy 04/13/2020   DDD (degenerative disc disease), thoracic 04/13/2020   DDD (degenerative disc disease), lumbosacral 04/13/2020   Lumbar levoscoliosis 04/13/2020   Lumbar Grade 1 Retrolisthesis of L1/L2 04/13/2020   Severe Narrowing of lumbar intervertebral disc space from L1-4 (Right) 04/13/2020   Renal cyst 04/13/2020   Abnormal MRI, lumbar spine (05/18/2018) 04/13/2020   Lumbosacral facet hypertrophy (Multilevel) (Bilateral) 04/13/2020   Lumbosacral facet syndrome (Multilevel) (Bilateral) (L>R) 04/13/2020   Chronic generalized pain 01/29/2020   Depression 01/29/2020   Chronic low back pain (1ry area of Pain) (Bilateral) (R>L) w/o sciatica 01/29/2020   Chronic feet pain (2ry area of Pain)  (Bilateral) (R>L) 01/29/2020   Chronic shoulder pain (3ry area of Pain) (Bilateral) (L>R) 01/29/2020   Chronic upper extremity pain (4th area of Pain) (Bilateral) (L>R) 01/29/2020   Chronic mid back pain (5th area of Pain) (Bilateral) (L>R) 01/29/2020   Cervicalgia 01/29/2020   Chronic neck pain (6th area of Pain) (Bilateral) (R>L) 01/29/2020   Chronic knee pain (8th area of Pain) (Bilateral) (L>R) 01/29/2020   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) 01/29/2020   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) 01/29/2020   Chronic hip pain (Bilateral) (L>R) 01/29/2020   Aphasia 01/29/2020   Chronic pain syndrome 01/27/2020   High risk medication use 01/27/2020   Disorder of skeletal system 01/27/2020   Problems influencing health status 01/27/2020   Nonexertional chest pain 10/08/2019   Osteoarthritis of shoulder (SEVERE) (Left) 06/10/2019   Anxiety 04/04/2019   Benign esophageal stricture 07/19/2018   Esophageal dysphagia 07/19/2018   Use of cane as ambulatory aid 07/13/2018   Healthcare maintenance 02/14/2018   Hypercholesterolemia 02/16/2017   Osteoarthritis of hip (Right) 01/10/2017   Bipolar 1 disorder ([place]) 10/31/2016   Essential hypertension 10/31/2016   Generalized osteoarthritis of multiple sites 10/31/2016    Onset and Duration of Symptoms: Symptoms began to present with the moved from [facility] in 2008 and has become increasingly more pronounced over time.  Progression of Symptoms: Patient has had increasing anxiety and adjustment issues along with progressive memory loss over time but this changes been very slow and presentation.  Triggering Factors: The patient does have a past history of significant alcohol use but is in complete remission.  Patient reports that her oldest daughter committed suicide around 10 years ago and she had trouble coping with that.  The biggest change was noted with the moved from [place] to this area to be closer  to her remaining 2 daughters.  Associated Symptoms (e.g., cognitive, emotional, behavioral): The patient is increasingly avoided social interactions and attributes that to the moves but even when these opportunities are available in their new living situation the patient has avoided group settings.  Additional Tests and Measures from other records:  Neuroimaging Results: Patient has had previous neuroimaging including MRI 2 years ago that suggested some small vessel disease and mild cortical atrophy but no acute process or indications of significant abnormalities on MRI.  Laboratory Tests: Patient's laboratory work have been generally within normal limits and  she has had normal B12 assessment.  Patient has had formal sleep study with diagnosis of mild obstructive sleep apnea but has had great difficulty tolerating the CPAP machine and they are continuing to work on settings.  Patient has not been compliant with CPAP most of the time.  Other Relevant Assessments: Patient continues to be followed by psychiatry due to previous diagnosis of bipolar affective disorder, major depression and anxiety disorders etc.  Patient also has a history of alcohol use disorder which is in remission.  Current Typical Mood State:  Anxious, Anhedonia, and Apathetic  Sleep: Patient reports that she sleeps well and minimizes difficulties in sleep although she has been diagnosed with obstructive sleep apnea.  Diet Pattern: Patient has food prepared for the most part for her and eats a generally good diet.  Behavioral Observation/Mental Status:   TAMIKKA MCMASTER Ryke  presents as a 77 y.o.-year-old Right handed Caucasian Female who appeared her stated age. her dress was Appropriate and she was Well Groomed and her manners were Appropriate to the situation.  her participation was indicative of Appropriate and Redirectable behaviors.  There were not physical disabilities noted.  she displayed an appropriate level of cooperation  and motivation.    Interactions:    Active Appropriate and Redirectable  Attention:   abnormal and attention span appeared shorter than expected for age  Memory:   abnormal; remote memory intact, recent memory impaired  Visuo-spatial:   Formal assessment of visual spatial visual constructional abilities was not conducted today as it will be part of the formal neuropsychological evaluation.  However, the patient and her husband both indicate that there are some changes in geographic orientation and the patient has gotten turned around leaving activities at their assisted living facility when trying to navigate independently.  Patient essentially insist on her husband being around most of the time when she is out of the house.  Speech (Volume):  normal  Speech:   Some word finding difficulties noted particularly for naming locations and objects.  Patient was not able to come up with the [place] country that she and her husband lived in for quite some time [place]) and had difficulty naming other locations they have lived in.  Thought Process:  Coherent and Comptroller, Disorganized, and Preoccupied  Though Content:  WNL; not suicidal and not homicidal  Orientation:   person  Judgment:   Poor  Planning:   Poor  Affect:    Anxious and Irritable  Mood:    Anxious and Dysphoric  Insight:   Shallow  Intelligence:   very high  Marital Status/Living:  The patient was born and raised in [place] along with 2 siblings with no significant developmental history issues or major childhood illnesses.  Patient is married and continues to live with her husband.  The patient has had 3 children 1 of whom passed away roughly 10 years ago.  The patient and her husband have moved to a progressive assisted living facility and these changes both moving to this area from [facility] to here have been challenging for the patient.  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  Current Occupation:    Patient is retired.  Work History:   Patient taught English as a second language and other educational  opportunities in [place] while she and her husband lived in [place].  Hobbies and Interests: Patient is engaging in very little activities outside of her apartment and reports that she spends most of her day watching TV.  Impact of Symptoms on Social Functioning and Interpersonal Relationships: Patient reports and husband reports that there have been significant decrease in social functioning and interpersonal relationships with patient attributed to moves over the past several years with loss of social connections.   Psychiatric History:     Previous Diagnoses: Patient has been diagnosed previously with both bipolar 1 and bipolar 2 disorders at various times as well as diagnosis of major depressive disorder, anxiety disorder and panic disorder.  Past Psychiatric Treatments: Patient continues to be followed by psychiatry.  History of Substance Use or Abuse:  There is a documented history of alcohol abuse confirmed by the patient.  The patient is in remission and denies current alcohol use.  Family Med/Psych History:  Family History  Problem Relation Age of Onset   Parkinson's disease Mother    CAD Father    Hypertension Father    Anxiety disorder Daughter    Depression Daughter    Suicidality Daughter    Alcohol abuse Daughter    Anxiety disorder Daughter    Anxiety disorder Son    Breast cancer Neg Hx     Impression/DX:   Blakelee Certo is a 77 year old female referred for neuropsychological evaluation by her treating psychiatrist Ursula Alert, MD due to ongoing and progressing cognitive difficulties with previous diagnosis of major  neurocognitive disorder back in 2021 and longstanding history of depression, anxiety symptoms with panic events.  Patient has also been diagnosed with obstructive sleep apnea and has had difficulties recently with her CPAP machine around setting and difficulties with the mask.  She reports that they have been working on settings recently.  Patient has significant past medical history including previous diagnosis of major neurocognitive disorder made by Dr. 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  Disposition/Plan:  We will perform repeat neuropsychological testing to aid in more specific diagnostic considerations.  The patient had previous neuropsychological evaluation and we will mare that previous battery where appropriate.  The patient will be administered the RBANS, COWAT, [place] card sorting test, selective measures from the Wechsler Adult Intelligence Scale including digit span and symbol search/coding measures along with similarities, information and comprehension subtest, Block design subtest in the Trail Making Test part a and B.  Once these are completed a formal report will be prepared and made available to her referring physician as well as being made available in her EMR for her neurologist at [facility] clinic.  I will also sit down with the patient and her husband and provide direct feedback with specific recommendations relevant to them as well.  Diagnosis:    Major neurocognitive disorder (HCC)  Chronic pain syndrome  MDD (major depressive disorder),  recurrent, in partial remission (HCC)  OSA (obstructive sleep apnea)  Panic disorder        Note: This document was prepared using Dragon voice recognition software and may include unintentional dictation errors.   Electronically Signed   _______________________ Ilean Skill, Psy.D.  Clinical Neuropsychologist

## 2022-12-13 ENCOUNTER — Ambulatory Visit: Payer: Medicare Other | Admitting: Psychology

## 2022-12-28 ENCOUNTER — Other Ambulatory Visit: Payer: Self-pay | Admitting: Psychiatry

## 2022-12-28 DIAGNOSIS — Z8659 Personal history of other mental and behavioral disorders: Secondary | ICD-10-CM

## 2022-12-28 DIAGNOSIS — F0390 Unspecified dementia without behavioral disturbance: Secondary | ICD-10-CM

## 2022-12-28 DIAGNOSIS — F410 Panic disorder [episodic paroxysmal anxiety] without agoraphobia: Secondary | ICD-10-CM

## 2023-01-02 ENCOUNTER — Encounter: Payer: Self-pay | Admitting: Psychiatry

## 2023-01-02 ENCOUNTER — Ambulatory Visit (INDEPENDENT_AMBULATORY_CARE_PROVIDER_SITE_OTHER): Payer: Medicare Other | Admitting: Psychiatry

## 2023-01-02 VITALS — BP 135/77 | HR 73 | Temp 97.5°F | Ht 67.0 in | Wt 185.0 lb

## 2023-01-02 DIAGNOSIS — G4733 Obstructive sleep apnea (adult) (pediatric): Secondary | ICD-10-CM

## 2023-01-02 DIAGNOSIS — F3341 Major depressive disorder, recurrent, in partial remission: Secondary | ICD-10-CM

## 2023-01-02 DIAGNOSIS — F0390 Unspecified dementia without behavioral disturbance: Secondary | ICD-10-CM | POA: Diagnosis not present

## 2023-01-02 DIAGNOSIS — F1021 Alcohol dependence, in remission: Secondary | ICD-10-CM

## 2023-01-02 DIAGNOSIS — F410 Panic disorder [episodic paroxysmal anxiety] without agoraphobia: Secondary | ICD-10-CM

## 2023-01-02 DIAGNOSIS — Z8659 Personal history of other mental and behavioral disorders: Secondary | ICD-10-CM

## 2023-01-02 MED ORDER — CLONAZEPAM 0.5 MG PO TABS: 0.2500 mg | ORAL_TABLET | ORAL | 1 refills | Status: DC

## 2023-01-02 MED ORDER — VORTIOXETINE HBR 10 MG PO TABS: ORAL_TABLET | ORAL | 2 refills | Status: DC

## 2023-01-02 MED ORDER — QUETIAPINE FUMARATE 25 MG PO TABS: 25.0000 mg | ORAL_TABLET | Freq: Every day | ORAL | 1 refills | Status: DC

## 2023-01-02 NOTE — Progress Notes (Signed)
BH MD OP Progress Note  01/02/2023 12:54 PM Jodi Parrish  MRN:  [id number]  Chief Complaint:  Chief Complaint  Patient presents with   Follow-up   Anxiety   Depression   Medication Refill   HPI: Jodi Parrish is a 77 year old Caucasian female married, currently lives in [place] [place] [facility], has a history of major neurocognitive disorder, panic disorder, MDD, history of alcohol use disorder in remission was evaluated in office today.  Patient being a limited historian, information was obtained from spouse-Steve, interviewed separately.  Patient today appeared to be alert and oriented to self, situation.  Could not give the place, could not give the date.  Patient appeared to have trouble remembering medication names.  Patient today denies any concerns.  However per spouse patient has been decompensating when it comes to her memory.  She also tends to cling on to him a lot.  She has been canceling appointments.  Had her first visit for neuropsychological testing however did not go for the actual testing .  She was going to get one-on-one care at the facility, however she canceled that appointment for evaluation.  Patient tends to have mood swings and anxiety.  It has been hence very overwhelming for her spouse.  Patient also has been noncompliant with CPAP visits, currently not using CPAP.  She does take her medications.  Agreeable to medication changes to address her mood swings and anxiety.  Patient denies any suicidality, homicidality or perceptual disturbances.  Denies any other concerns today.  Visit Diagnosis:    ICD-10-CM   1. Major neurocognitive disorder  F03.90    Mild to moderate    2. Panic disorder  F41.0 clonazePAM (KLONOPIN) 0.5 MG tablet    3. Recurrent major depressive disorder, in partial remission  F33.41 QUEtiapine (SEROQUEL) 25 MG tablet    4. OSA (obstructive sleep apnea)  G47.33     5. Alcohol use disorder, severe, in sustained  remission  F10.21     6. History of bipolar disorder  Z86.59 vortioxetine HBr (TRINTELLIX) 10 MG TABS tablet      Past Psychiatric History: I have reviewed past psychiatric history from progress note on 04/28/2020.  Past trials of medications-Celexa, Prozac, Zoloft, Cymbalta, Abilify, Lamictal, mirtazapine, trazodone-multiple other medications.  Past Medical History:  Past Medical History:  Diagnosis Date   Ankle fracture    right   Anxiety    Arthritis    Bipolar disorder    Depression    Diverticulitis    Hyperlipidemia    Hypertension    Osteoarthritis    Overactive bladder     Past Surgical History:  Procedure Laterality Date   ABDOMINAL HYSTERECTOMY  1998   BARTHOLIN GLAND CYST EXCISION  1976   COLONOSCOPY     COLONOSCOPY WITH PROPOFOL N/A 04/10/2018   Procedure: COLONOSCOPY WITH PROPOFOL;  Surgeon: Toledo, Boykin Nearing, MD;  Location: [facility];  Service: Gastroenterology;  Laterality: N/A;   ELBOW SURGERY Right 1995   crushed elbow with hardware implanted   ESOPHAGOGASTRODUODENOSCOPY (EGD) WITH PROPOFOL N/A 04/10/2018   Procedure: ESOPHAGOGASTRODUODENOSCOPY (EGD) WITH PROPOFOL;  Surgeon: Toledo, Boykin Nearing, MD;  Location: [facility];  Service: Gastroenterology;  Laterality: N/A;   JOINT REPLACEMENT Left 2011   knee   JOINT REPLACEMENT Right 2009   elbow   KNEE SURGERY Left 2000   PELVIC FLOOR REPAIR  2009   PELVIC FLOOR LIFT   REPLACEMENT TOTAL KNEE Left 2016   TONSILLECTOMY  1958  TOTAL HIP ARTHROPLASTY Left 2008   TOTAL HIP ARTHROPLASTY Right 01/10/2017   Procedure: TOTAL HIP ARTHROPLASTY ANTERIOR APPROACH;  Surgeon: Kennedy Bucker, MD;  Location: [facility];  Service: Orthopedics;  Laterality: Right;    Family Psychiatric History: I have reviewed family psychiatric history from progress note on 04/28/2020.  Family History:  Family History  Problem Relation Age of Onset   Parkinson's disease Mother    CAD Father    Hypertension Father    Anxiety disorder  Daughter    Depression Daughter    Suicidality Daughter    Alcohol abuse Daughter    Anxiety disorder Daughter    Anxiety disorder Son    Breast cancer Neg Hx     Social History: I have reviewed social history from progress note on 04/28/2020. Social History   Socioeconomic History   Marital status: Married    Spouse name: Not on file   Number of children: Not on file   Years of education: Not on file   Highest education level: Not on file  Occupational History   Not on file  Tobacco Use   Smoking status: Former    Types: Cigarettes   Smokeless tobacco: Never   Tobacco comments:    Smoked in college.   Vaping Use   Vaping Use: Never used  Substance and Sexual Activity   Alcohol use: Not Currently    Comment: HEAVY USE IN THE PAST- QUIT 3 YEARS AGO   Drug use: No   Sexual activity: Yes  Other Topics Concern   Not on file  Social History Narrative   Not on file   Social Determinants of Health   Financial Resource Strain: Not on file  Food Insecurity: Not on file  Transportation Needs: Not on file  Physical Activity: Not on file  Stress: Not on file  Social Connections: Not on file    Allergies:  Allergies  Allergen Reactions   Budesonide-Formoterol Fumarate     Other reaction(s): Unknown   Abilify [Aripiprazole] Anxiety    SEVERE HYPERACTIVITY   Duloxetine Anxiety    Metabolic Disorder Labs: No results found for: "HGBA1C", "MPG" No results found for: "PROLACTIN" No results found for: "CHOL", "TRIG", "HDL", "CHOLHDL", "VLDL", "LDLCALC" Lab Results  Component Value Date   TSH 0.95 09/18/2013    Therapeutic Level Labs: No results found for: "LITHIUM" No results found for: "VALPROATE" No results found for: "CBMZ"  Current Medications: Current Outpatient Medications  Medication Sig Dispense Refill   Cholecalciferol (VITAMIN D-3) 5000 units TABS Take 1 tablet by mouth daily.     donepezil (ARICEPT) 5 MG tablet Take 1 tablet by mouth at bedtime.      folic acid (FOLVITE) 800 MCG tablet Take 400 mcg by mouth daily.     gabapentin (NEURONTIN) 100 MG capsule Take 200 mg by mouth 3 (three) times daily.     magnesium 30 MG tablet Take 250 mg by mouth daily.     pantoprazole (PROTONIX) 40 MG tablet Take 40 mg by mouth daily.     polyethylene glycol (MIRALAX / GLYCOLAX) 17 g packet Take 17 g by mouth daily.     pravastatin (PRAVACHOL) 40 MG tablet Take 40 mg by mouth at bedtime.     propranolol ER (INDERAL LA) 60 MG 24 hr capsule Take 60 mg by mouth daily. Takes differently     QUEtiapine (SEROQUEL) 100 MG tablet TAKE 1 TABLET(100 MG) BY MOUTH AT BEDTIME 30 tablet 2   QUEtiapine (SEROQUEL) 25 MG  tablet Take 1 tablet (25 mg total) by mouth at bedtime. Start taking along with 100 mg , total of 125 mg daily at bedtime 30 tablet 1   Simethicone (GAS-X EXTRA STRENGTH) 125 MG CAPS Take 125 mg by mouth. Twice a day as needed     tobramycin (TOBREX) 0.3 % ophthalmic solution Place 1 drop into the left eye every 6 (six) hours. 5 mL 0   traZODone (DESYREL) 100 MG tablet Take 1-1.5 tablets (100-150 mg total) by mouth at bedtime as needed for sleep. 45 tablet 2   UNABLE TO FIND 1 Device by Other route as directed. 1 Device O   vitamin B-12 (CYANOCOBALAMIN) 1000 MCG tablet Take 1,000 mcg by mouth daily.     clonazePAM (KLONOPIN) 0.5 MG tablet Take 0.5-1 tablets (0.25-0.5 mg total) by mouth as directed. Take daily in the morning and at bedtime as needed for anxiety 60 tablet 1   vortioxetine HBr (TRINTELLIX) 10 MG TABS tablet TAKE 1 TABLET(10 MG) BY MOUTH DAILY. STOP TRINTELLIX 20 MG 30 tablet 2   No current facility-administered medications for this visit.     Musculoskeletal: Strength & Muscle Tone: within normal limits Gait & Station: normal Patient leans: N/A  Psychiatric Specialty Exam: Review of Systems  Unable to perform ROS: Dementia    Blood pressure 135/77, pulse 73, temperature (!) 97.5 F (36.4 C), temperature source Skin, height 5\' 7"   (1.702 m), weight 185 lb (83.9 kg).Body mass index is 28.98 kg/m.  General Appearance: Casual  Eye Contact:  Fair  Speech:  Normal Rate  Volume:  Normal  Mood:  Anxious  Affect:  Congruent  Thought Process:  Linear and Descriptions of Associations: Intact  Orientation:  Other:  To person, situation  Thought Content: Logical   Suicidal Thoughts:  No  Homicidal Thoughts:  No  Memory:  Immediate;   Poor Recent;   Poor Remote;   Poor  Judgement:  Poor  Insight:  Shallow  Psychomotor Activity:  Normal  Concentration:  Concentration: Poor and Attention Span: Poor  Recall:  Poor  Fund of Knowledge: Poor  Language: Fair  Akathisia:  No  Handed:  Right  AIMS (if indicated): done  Assets:  Housing Social Support Talents/Skills Transportation  ADL's:  Intact  Cognition: Impaired,  Mild  Sleep:   restless at night, excessive on and off during day , noncompliant with CPAP for OSA   Screenings: AIMS    Flowsheet Row Office Visit from 11/03/2022 in [facility] Office Visit from 09/15/2022 in [facility] Office Visit from 07/18/2022 in [facility] Office Visit from 06/20/2022 in [facility] Office Visit from 05/19/2022 in [facility]  AIMS Total Score 0 0 0 0 0      GAD-7    Flowsheet Row Office Visit from 11/03/2022 in [facility] Office Visit from 12/08/2021 in [facility]  Total GAD-7 Score 8 1      PHQ2-9    Flowsheet Row Office Visit from 11/03/2022 in [facility] Office Visit from 09/15/2022 in [facility] Office Visit from 07/18/2022 in [facility] Office Visit from 06/20/2022 in  [facility] Office Visit from 05/19/2022 in [facility]  PHQ-2 Total Score 1 4 2 1  4  PHQ-9 Total Score 7 9 4 5 16       Flowsheet Row Office Visit from 01/02/2023 in [facility] Office Visit from 11/03/2022 in [facility] Office Visit from 09/15/2022 in [facility]  C-SSRS RISK CATEGORY No Risk No Risk No Risk        Assessment and Plan: NAVID MUSACCHIO Ryke is a 77 year old Caucasian female, married, has a history of major neurocognitive disorder, MDD, panic disorder, obstructive sleep apnea noncompliant on CPAP was evaluated in office today.  Patient is currently struggling with memory problems, noncompliant with CPAP, noncompliant with neuropsychological testing, will benefit from following plan to address her mood symptoms.  Plan as noted below.  Plan Major neurocognitive disorder-unstable Neuropsychological testing-06/26/2020-Dr. Roseanne Reno. Patient had initial evaluation for neuropsychological testing by Dr. Kieth Brightly recently.  However she canceled testing.  I have reached out to Dr. Kieth Brightly regarding this. Continue follow-up with neurology-Dr. Malvin Johns.  Continue Aricept 5 mg p.o. daily Encouraged compliance.  GAD-unstable Increase Klonopin to 0.25-0.5 mg twice a day as needed, daily in the morning as well as at bedtime for anxiety Gabapentin 200 mg 3 times daily Seroquel increased to 125 mg p.o. nightly   MDD-unstable Trintellix 10 mg p.o. daily Patient was referred for CBT   Sleep apnea-unstable She is noncompliant with CPAP Was referred to pulmonology Provided patient education.  Encouraged compliance.   Collateral information obtained from spouse, patient as well as spouse where interviewed separately.  Follow-up in clinic in 1 month or sooner if  needed.    Collaboration of Care: Collaboration of Care: Other I have communicated with Dr. Kieth Brightly regarding testing for this patient.  Patient/Guardian was advised Release of Information must be obtained prior to any record release in order to collaborate their care with an outside provider. Patient/Guardian was advised if they have not already done so to contact the registration department to sign all necessary forms in order for [place] to release information regarding their care.   Consent: Patient/Guardian gives verbal consent for treatment and assignment of benefits for services provided during this visit. Patient/Guardian expressed understanding and agreed to proceed.   I have spent atleast 40 minutes face to face with patient today which includes the time spent for preparing to see the patient ( e.g., review of test, records ), obtaining and to review and separately obtained history , ordering medications and test ,psychoeducation and supportive psychotherapy and care coordination,as well as documenting clinical information in electronic health record.    This note was generated in part or whole with voice recognition software. Voice recognition is usually quite accurate but there are transcription errors that can and very often do occur. I apologize for any typographical errors that were not detected and corrected.    Jomarie Longs, MD 01/02/2023, 3:41 PM

## 2023-01-02 NOTE — Patient Instructions (Signed)
  Please call for testing for memory changes  Hershal Coria, Psyd [address] [place], [place], [place] [address]  [phone]

## 2023-01-10 ENCOUNTER — Telehealth: Payer: Self-pay

## 2023-01-10 NOTE — Telephone Encounter (Signed)
Called the patients spouse to make him aware that I have left messages for him to call the office gave him the message from Dr Elna Breslow he gave the phone to the patient she did not know what I was talking about I then asked to speak to her husband he explained to her she stated that she did not want to go back to Dr Kieth Brightly for further testing I let the patient know that if she decide to go back she could just call his office and reschedule they voiced understanding.

## 2023-01-23 ENCOUNTER — Other Ambulatory Visit: Payer: Self-pay | Admitting: Psychiatry

## 2023-01-23 DIAGNOSIS — F3341 Major depressive disorder, recurrent, in partial remission: Secondary | ICD-10-CM

## 2023-02-09 ENCOUNTER — Other Ambulatory Visit
Admission: RE | Admit: 2023-02-09 | Discharge: 2023-02-09 | Disposition: A | Discharge status: Home / Self Care | Payer: Medicare Other | Source: Ambulatory Visit | Attending: Internal Medicine | Admitting: Internal Medicine

## 2023-02-09 DIAGNOSIS — Z87898 Personal history of other specified conditions: Secondary | ICD-10-CM | POA: Diagnosis present

## 2023-02-09 LAB — TROPONIN I (HIGH SENSITIVITY): Troponin I (High Sensitivity): <2 ng/L (ref <18)

## 2023-02-17 ENCOUNTER — Ambulatory Visit: Payer: Medicare Other | Admitting: Psychiatry

## 2023-02-20 ENCOUNTER — Ambulatory Visit (INDEPENDENT_AMBULATORY_CARE_PROVIDER_SITE_OTHER): Payer: Medicare Other | Admitting: Psychiatry

## 2023-02-20 ENCOUNTER — Encounter: Payer: Self-pay | Admitting: Psychiatry

## 2023-02-20 VITALS — BP 124/82 | HR 77 | Temp 99.2°F | Ht 67.0 in | Wt 189.0 lb

## 2023-02-20 DIAGNOSIS — F410 Panic disorder [episodic paroxysmal anxiety] without agoraphobia: Secondary | ICD-10-CM | POA: Diagnosis not present

## 2023-02-20 DIAGNOSIS — F0390 Unspecified dementia without behavioral disturbance: Secondary | ICD-10-CM | POA: Diagnosis not present

## 2023-02-20 DIAGNOSIS — F3341 Major depressive disorder, recurrent, in partial remission: Secondary | ICD-10-CM | POA: Diagnosis not present

## 2023-02-20 DIAGNOSIS — G4733 Obstructive sleep apnea (adult) (pediatric): Secondary | ICD-10-CM

## 2023-02-20 DIAGNOSIS — Z91199 Patient's noncompliance with other medical treatment and regimen due to unspecified reason: Secondary | ICD-10-CM

## 2023-02-20 DIAGNOSIS — F1021 Alcohol dependence, in remission: Secondary | ICD-10-CM

## 2023-02-20 DIAGNOSIS — Z8659 Personal history of other mental and behavioral disorders: Secondary | ICD-10-CM

## 2023-02-20 MED ORDER — CLONAZEPAM 0.5 MG PO TABS
0.2500 mg | ORAL_TABLET | ORAL | 1 refills | Status: DC
Start: 2023-03-08 — End: 2023-05-15

## 2023-02-20 NOTE — Progress Notes (Unsigned)
BH MD OP Progress Note  02/20/2023 3:04 PM Jodi Parrish  MRN:  [id number]  Chief Complaint:  Chief Complaint  Patient presents with   Follow-up   Anxiety   Depression   Medication Refill   HPI: Jodi Parrish is a 77 year old Caucasian female, married, currently lives in [place], [facility], has a history of major neurocognitive disorder, panic disorder, MDD, history of alcohol use disorder in remission was evaluated in office today.  Patient being a limited historian collateral information was obtained from spouse-Steve, interviewed separately.  Patient today appeared to be alert and oriented to self and situation.  She could not give the place, the date.  Patient reported she was doing well although spouse did not agree with that.  According to spouse patient continues to have significant memory problems.  She does not like to be left alone.  She spends a lot of time in bed.  She is not interested in much activities anymore.  She does have a therapist who comes in, however he is not part of the visits and he hence does not know how much that is helpful or what they are working on.  Patient also unable to say much about this.  According to spouse patient was never able to go back for neuropsychological testing although they contacted them back to schedule an appointment.  Patient is currently following up with a neurologist.  Recently Aricept was increased per neurology,Dr.Potter, I have reviewed notes-01/16/2023.  Patient did not express any suicidality, homicidality or perceptual disturbances.  Patient is compliant on medications,spouse currently manages medications and gives them to her.  Did not express any side effects.    Visit Diagnosis:    ICD-10-CM   1. Major neurocognitive disorder (HCC)  F03.90    Mild to moderate    2. Panic disorder  F41.0 clonazePAM (KLONOPIN) 0.5 MG tablet    3. Recurrent major depressive disorder, in partial remission  (HCC)  F33.41     4. OSA (obstructive sleep apnea)  G47.33     5. Alcohol use disorder, severe, in sustained remission (HCC)  F10.21     6. Noncompliance with treatment plan  Z91.199     7. History of bipolar disorder  Z86.59       Past Psychiatric History: I have reviewed past psychiatric history from progress note on 04/28/2020.  Past trials of medications-Celexa, Prozac, Zoloft, Cymbalta, Abilify, Lamictal, mirtazapine, trazodone-multiple other medications.  Past Medical History:  Past Medical History:  Diagnosis Date   Ankle fracture    right   Anxiety    Arthritis    Bipolar disorder (HCC)    Depression    Diverticulitis    Hyperlipidemia    Hypertension    Osteoarthritis    Overactive bladder     Past Surgical History:  Procedure Laterality Date   ABDOMINAL HYSTERECTOMY  1998   BARTHOLIN GLAND CYST EXCISION  1976   COLONOSCOPY     COLONOSCOPY WITH PROPOFOL N/A 04/10/2018   Procedure: COLONOSCOPY WITH PROPOFOL;  Surgeon: Toledo, Boykin Nearing, MD;  Location: [facility];  Service: Gastroenterology;  Laterality: N/A;   ELBOW SURGERY Right 1995   crushed elbow with hardware implanted   ESOPHAGOGASTRODUODENOSCOPY (EGD) WITH PROPOFOL N/A 04/10/2018   Procedure: ESOPHAGOGASTRODUODENOSCOPY (EGD) WITH PROPOFOL;  Surgeon: Toledo, Boykin Nearing, MD;  Location: [facility];  Service: Gastroenterology;  Laterality: N/A;   JOINT REPLACEMENT Left 2011   knee   JOINT REPLACEMENT Right 2009  elbow   KNEE SURGERY Left 2000   PELVIC FLOOR REPAIR  2009   PELVIC FLOOR LIFT   REPLACEMENT TOTAL KNEE Left 2016   TONSILLECTOMY  1958   TOTAL HIP ARTHROPLASTY Left 2008   TOTAL HIP ARTHROPLASTY Right 01/10/2017   Procedure: TOTAL HIP ARTHROPLASTY ANTERIOR APPROACH;  Surgeon: Kennedy Bucker, MD;  Location: [facility];  Service: Orthopedics;  Laterality: Right;    Family Psychiatric History: I have reviewed family psychiatric history from progress note on 04/28/2020.  Family History:  Family  History  Problem Relation Age of Onset   Parkinson's disease Mother    CAD Father    Hypertension Father    Anxiety disorder Daughter    Depression Daughter    Suicidality Daughter    Alcohol abuse Daughter    Anxiety disorder Daughter    Anxiety disorder Son    Breast cancer Neg Hx     Social History: I have reviewed social history from progress note on 04/28/2020. Social History   Socioeconomic History   Marital status: Married    Spouse name: Not on file   Number of children: Not on file   Years of education: Not on file   Highest education level: Not on file  Occupational History   Not on file  Tobacco Use   Smoking status: Former    Types: Cigarettes   Smokeless tobacco: Never   Tobacco comments:    Smoked in college.   Vaping Use   Vaping Use: Never used  Substance and Sexual Activity   Alcohol use: Not Currently    Comment: HEAVY USE IN THE PAST- QUIT 3 YEARS AGO   Drug use: No   Sexual activity: Yes  Other Topics Concern   Not on file  Social History Narrative   Not on file   Social Determinants of Health   Financial Resource Strain: Not on file  Food Insecurity: Not on file  Transportation Needs: Not on file  Physical Activity: Not on file  Stress: Not on file  Social Connections: Not on file    Allergies:  Allergies  Allergen Reactions   Budesonide-Formoterol Fumarate     Other reaction(s): Unknown   Abilify [Aripiprazole] Anxiety    SEVERE HYPERACTIVITY   Duloxetine Anxiety    Metabolic Disorder Labs: No results found for: "HGBA1C", "MPG" No results found for: "PROLACTIN" No results found for: "CHOL", "TRIG", "HDL", "CHOLHDL", "VLDL", "LDLCALC" Lab Results  Component Value Date   TSH 0.95 09/18/2013    Therapeutic Level Labs: No results found for: "LITHIUM" No results found for: "VALPROATE" No results found for: "CBMZ"  Current Medications: Current Outpatient Medications  Medication Sig Dispense Refill   acetaminophen (TYLENOL)  650 MG CR tablet Take 650 mg by mouth every 8 (eight) hours as needed for pain.     amoxicillin (AMOXIL) 500 MG capsule Take 500 mg by mouth as directed. Prior to dental procedure     Cholecalciferol (VITAMIN D-3) 5000 units TABS Take 1 tablet by mouth daily.     [START ON 03/08/2023] clonazePAM (KLONOPIN) 0.5 MG tablet Take 0.5-1 tablets (0.25-0.5 mg total) by mouth as directed. Take daily in the morning and at bedtime as needed for anxiety 60 tablet 1   donepezil (ARICEPT) 5 MG tablet Take 1 tablet by mouth at bedtime.     folic acid (FOLVITE) 800 MCG tablet Take 400 mcg by mouth daily.     gabapentin (NEURONTIN) 100 MG capsule Take 200 mg by mouth 3 (three) times  daily.     magnesium 30 MG tablet Take 250 mg by mouth daily.     pantoprazole (PROTONIX) 40 MG tablet Take 40 mg by mouth daily.     polyethylene glycol (MIRALAX / GLYCOLAX) 17 g packet Take 17 g by mouth daily.     pravastatin (PRAVACHOL) 40 MG tablet Take 40 mg by mouth at bedtime.     propranolol ER (INDERAL LA) 60 MG 24 hr capsule Take 60 mg by mouth daily. Takes differently     QUEtiapine (SEROQUEL) 100 MG tablet TAKE 1 TABLET(100 MG) BY MOUTH AT BEDTIME 30 tablet 2   QUEtiapine (SEROQUEL) 25 MG tablet Take 1 tablet (25 mg total) by mouth at bedtime. Start taking along with 100 mg , total of 125 mg daily at bedtime 30 tablet 1   Simethicone (GAS-X EXTRA STRENGTH) 125 MG CAPS Take 125 mg by mouth. Twice a day as needed     tobramycin (TOBREX) 0.3 % ophthalmic solution Place 1 drop into the left eye every 6 (six) hours. 5 mL 0   traZODone (DESYREL) 100 MG tablet TAKE 1 TO 1 AND 1/2 TABLETS(100 TO 150 MG) BY MOUTH AT BEDTIME AS NEEDED FOR SLEEP 45 tablet 2   UNABLE TO FIND 1 Device by Other route as directed. 1 Device O   vitamin B-12 (CYANOCOBALAMIN) 1000 MCG tablet Take 1,000 mcg by mouth daily.     vortioxetine HBr (TRINTELLIX) 10 MG TABS tablet TAKE 1 TABLET(10 MG) BY MOUTH DAILY. STOP TRINTELLIX 20 MG 30 tablet 2   No current  facility-administered medications for this visit.     Musculoskeletal: Strength & Muscle Tone: within normal limits Gait & Station:  slow Patient leans: N/A  Psychiatric Specialty Exam: Review of Systems  Unable to perform ROS: Psychiatric disorder     Blood pressure 124/82, pulse 77, temperature 99.2 F (37.3 C), temperature source Skin, height 5\' 7"  (1.702 m), weight 189 lb (85.7 kg).Body mass index is 29.6 kg/m.  General Appearance: Fairly Groomed  Eye Contact:  Fair  Speech:  Slow  Volume:  Decreased  Mood:   not depressed  Affect:  Appropriate  Thought Process:  Goal Directed and Descriptions of Associations: Intact  Orientation:  Other:  self, situation  Thought Content: Logical   Suicidal Thoughts:  No  Homicidal Thoughts:  No  Memory:  Immediate;   Fair Recent;   Poor Remote;   Poor  Judgement:  Fair  Insight:  Fair  Psychomotor Activity:  Decreased  Concentration:  Concentration: Poor and Attention Span: Poor  Recall:  Poor  Fund of Knowledge: Fair  Language: Fair  Akathisia:  No  Handed:  Right  AIMS (if indicated):  done  Assets:, Access to healthcare ,social support  ADL's:  Intact  Cognition: baseline  Sleep:  Fair per report however has been reports excessive sleepiness during the day.  Patient also noncompliant on CPAP.   Screenings: AIMS    Flowsheet Row Office Visit from 02/20/2023 in [facility] Visit from 11/03/2022 in [facility] Office Visit from 09/15/2022 in [facility] Office Visit from 07/18/2022 in [facility] Office Visit from 06/20/2022 in [facility]  AIMS Total Score 0 0 0 0 0      GAD-7    Flowsheet Row Office Visit from 11/03/2022 in [facility] Office Visit from 12/08/2021 in [facility]  Total GAD-7 Score 8 1      PHQ2-9    Flowsheet Row Office Visit from 11/03/2022 in [facility] Office Visit from 09/15/2022 in [facility] Office Visit from 07/18/2022 in [facility] Office Visit from 06/20/2022 in [facility] Office Visit from 05/19/2022 in [facility]  PHQ-2 Total Score 1 4 2 1 4   PHQ-9 Total Score 7 9 4 5 16       Flowsheet Row Office Visit from 02/20/2023 in [facility] Office Visit from 01/02/2023 in [facility] Office Visit from 11/03/2022 in [facility]  C-SSRS RISK CATEGORY No Risk No Risk No Risk        Assessment and Plan: Jodi Parrish is a 77 year old Caucasian female, married, has a history of major neurocognitive disorder, MDD, panic disorder, obstructive sleep apnea noncompliant on CPAP was evaluated in office today.  Patient continues to have significant memory problems, patient noncompliant with treatment plan including referral for neuropsychological testing, will benefit from the following plan.  Plan Major neurocognitive disorder-unstable Neuropsychological testing-06/26/2020-Dr. Roseanne Reno.  Patient had initial evaluation for neuropsychological testing by Dr. Kieth Brightly however patient canceled the actual testing.  I had reached out to Dr. Kieth Brightly after patient's last visit with this provider and they were agreeable to schedule this patient for testing again however per spouse patient declined although Dr. Marvetta Gibbons office reached out to schedule an appointment.  Some time was spent with spouse as well as patient, encouraged to schedule an appointment for testing. Patient to continue to  follow-up with Dr. Potter-neurologist-currently on Aricept 10 mg p.o. daily  GAD-improving Klonopin 0.25 to 0.5 mg p.o. twice daily as needed for anxiety Gabapentin 200 mg p.o. 3 times daily Seroquel 125 mg p.o. nightly  MDD-some improvement Patient denies depression although appeared to have psychomotor retardation, and per collateral information obtained likely has apathy unknown from depression symptoms versus cognitive issues. Trintellix 10 mg p.o. daily Seroquel 125 mg p.o. nightly  Obstructive sleep apnea-unstable Patient noncompliant with CPAP Encouraged her to do so.  Alcohol use disorder in remission Will monitor closely  Collateral information obtained from spouse as noted above.  I have reviewed notes per Dr. Kenna Gilbert 01/02/2023 as noted above.    Collaboration of Care: Collaboration of Care: Referral or follow-up with counselor/therapist AEB encouraged to continue CBT.  Will coordinate care.  Patient/Guardian was advised Release of Information must be obtained prior to any record release in order to collaborate their care with an outside provider. Patient/Guardian was advised if they have not already done so to contact the registration department to sign all necessary forms in order for [place] to release information regarding their care.   Consent: Patient/Guardian gives verbal consent for treatment and assignment of benefits for services provided during this visit. Patient/Guardian expressed understanding and agreed to proceed.   This note was generated in part or whole with voice recognition software. Voice recognition is usually quite accurate but there are transcription errors that can and very often do occur. I apologize for any typographical errors that were not detected and corrected.    Jomarie Longs, MD 02/21/2023, 9:44 AM

## 2023-02-21 DIAGNOSIS — Z91199 Patient's noncompliance with other medical treatment and regimen due to unspecified reason: Secondary | ICD-10-CM | POA: Insufficient documentation

## 2023-02-27 ENCOUNTER — Other Ambulatory Visit: Payer: Self-pay | Admitting: Psychiatry

## 2023-02-27 DIAGNOSIS — F3341 Major depressive disorder, recurrent, in partial remission: Secondary | ICD-10-CM

## 2023-03-24 ENCOUNTER — Other Ambulatory Visit: Payer: Self-pay | Admitting: Psychiatry

## 2023-03-24 DIAGNOSIS — F0390 Unspecified dementia without behavioral disturbance: Secondary | ICD-10-CM

## 2023-03-24 DIAGNOSIS — Z8659 Personal history of other mental and behavioral disorders: Secondary | ICD-10-CM

## 2023-03-24 DIAGNOSIS — F410 Panic disorder [episodic paroxysmal anxiety] without agoraphobia: Secondary | ICD-10-CM

## 2023-04-11 ENCOUNTER — Other Ambulatory Visit: Payer: Self-pay | Admitting: Psychiatry

## 2023-04-11 DIAGNOSIS — Z8659 Personal history of other mental and behavioral disorders: Secondary | ICD-10-CM

## 2023-04-20 ENCOUNTER — Other Ambulatory Visit: Payer: Self-pay | Admitting: Psychiatry

## 2023-04-20 DIAGNOSIS — F3341 Major depressive disorder, recurrent, in partial remission: Secondary | ICD-10-CM

## 2023-04-26 ENCOUNTER — Other Ambulatory Visit: Payer: Self-pay | Admitting: Psychiatry

## 2023-04-26 DIAGNOSIS — F3341 Major depressive disorder, recurrent, in partial remission: Secondary | ICD-10-CM

## 2023-05-14 ENCOUNTER — Other Ambulatory Visit: Payer: Self-pay | Admitting: Psychiatry

## 2023-05-14 DIAGNOSIS — F410 Panic disorder [episodic paroxysmal anxiety] without agoraphobia: Secondary | ICD-10-CM

## 2023-05-15 ENCOUNTER — Telehealth: Payer: Self-pay | Admitting: Psychiatry

## 2023-05-15 NOTE — Telephone Encounter (Signed)
Patient states pharmacy will not fill the clonazepam. Advised patient to call pharmacy today to verify. Please look to see if filled.

## 2023-05-15 NOTE — Telephone Encounter (Signed)
Yes was approved this AM , sent to pharmacy.

## 2023-05-23 ENCOUNTER — Ambulatory Visit (INDEPENDENT_AMBULATORY_CARE_PROVIDER_SITE_OTHER): Payer: Medicare Other | Admitting: Psychiatry

## 2023-05-23 ENCOUNTER — Encounter: Payer: Self-pay | Admitting: Psychiatry

## 2023-05-23 VITALS — BP 119/79 | HR 77 | Temp 97.5°F | Ht 67.0 in | Wt 189.0 lb

## 2023-05-23 DIAGNOSIS — Z8659 Personal history of other mental and behavioral disorders: Secondary | ICD-10-CM

## 2023-05-23 DIAGNOSIS — G4733 Obstructive sleep apnea (adult) (pediatric): Secondary | ICD-10-CM

## 2023-05-23 DIAGNOSIS — F0390 Unspecified dementia without behavioral disturbance: Secondary | ICD-10-CM

## 2023-05-23 DIAGNOSIS — Z91199 Patient's noncompliance with other medical treatment and regimen due to unspecified reason: Secondary | ICD-10-CM

## 2023-05-23 DIAGNOSIS — F410 Panic disorder [episodic paroxysmal anxiety] without agoraphobia: Secondary | ICD-10-CM | POA: Diagnosis not present

## 2023-05-23 DIAGNOSIS — F3342 Major depressive disorder, recurrent, in full remission: Secondary | ICD-10-CM | POA: Diagnosis not present

## 2023-05-23 DIAGNOSIS — F1021 Alcohol dependence, in remission: Secondary | ICD-10-CM

## 2023-05-23 NOTE — Progress Notes (Signed)
BH MD OP Progress Note  05/23/2023 2:23 PM Jodi Parrish  MRN:  [id number]  Chief Complaint:  Chief Complaint  Patient presents with   Follow-up   Depression   Memory Loss   Anxiety   Medication Refill   HPI: Jodi Parrish Jodi Parrish is a 77 year old Caucasian female, married, currently lives in [place], lives at [employer], has a history of major neurocognitive disorder, panic disorder, MDD, history of alcohol use disorder in remission was evaluated in office today.  Patient being a limited historian, collateral information was obtained from husband, Brett Canales.  Patient as well as spouse were interviewed separately and together.  Patient today appeared to be alert, oriented to person, situation, was able to give the day as Tuesday and the season as fall however could not give the date, the month, year.  3 word memory immediate 3 out of 3, after 5 minutes 0 out of 3.  Patient was able to spell the word 'WORLD' forward but not backward.  Patient could not do calculation.  Patient unable to give any details about her medications.  Her husband provides it to her.  Patient reports she has a therapist who comes in to talk to her on a weekly basis.  She however could not elaborate further.  She overall feels she is doing well.  She reports her appetite is good.  She does sleep okay at night when she takes her medications like Seroquel and trazodone.  According to spouse patient takes naps throughout the day.  She continues to feel tired and hence goes back to bed midmorning and sleeps till around 1 PM.  She does not use her CPAP since it is not comfortable and she did not feel it helped.  Patient had an incident when she had chest pain recently after walking for a short distance through a parking lot on a hot day recently.  However once she drank water and took a dose of Klonopin she felt better and hence did not go to the emergency department.  Patient did not appear to be  delusional, did not appear to be responding to internal stimuli.  Denies any suicidality, homicidality or perceptual disturbances.  Patient continues to follow-up with neurology and is on medication-Aricept.  Denies any other concerns today.  Visit Diagnosis:    ICD-10-CM   1. Major neurocognitive disorder (HCC)  F03.90    Mild to moderate    2. Panic disorder  F41.0     3. Recurrent major depressive disorder, in full remission (HCC)  F33.42     4. OSA (obstructive sleep apnea)  G47.33    noncompliant on CPAP    5. Alcohol use disorder, severe, in sustained remission (HCC)  F10.21     6. Noncompliance with treatment plan  Z91.199     7. History of bipolar disorder  Z86.59       Past Psychiatric History: I have reviewed past psychiatric history from progress note on 04/28/2020.  Past trials of medications-Celexa, Prozac, Zoloft, Cymbalta, Abilify, Lamictal, mirtazapine, trazodone-multiple other medications.  Past Medical History:  Past Medical History:  Diagnosis Date   Ankle fracture    right   Anxiety    Arthritis    Bipolar disorder (HCC)    Depression    Diverticulitis    Hyperlipidemia    Hypertension    Osteoarthritis    Overactive bladder     Past Surgical History:  Procedure Laterality Date   ABDOMINAL HYSTERECTOMY  1998   BARTHOLIN GLAND CYST EXCISION  1976   COLONOSCOPY     COLONOSCOPY WITH PROPOFOL N/A 04/10/2018   Procedure: COLONOSCOPY WITH PROPOFOL;  Surgeon: Toledo, Boykin Nearing, MD;  Location: [facility];  Service: Gastroenterology;  Laterality: N/A;   ELBOW SURGERY Right 1995   crushed elbow with hardware implanted   ESOPHAGOGASTRODUODENOSCOPY (EGD) WITH PROPOFOL N/A 04/10/2018   Procedure: ESOPHAGOGASTRODUODENOSCOPY (EGD) WITH PROPOFOL;  Surgeon: Toledo, Boykin Nearing, MD;  Location: [facility] ENDOSCOPY;  Service: Gastroenterology;  Laterality: N/A;   JOINT REPLACEMENT Left 2011   knee   JOINT REPLACEMENT Right 2009   elbow   KNEE SURGERY Left 2000    PELVIC FLOOR REPAIR  2009   PELVIC FLOOR LIFT   REPLACEMENT TOTAL KNEE Left 2016   TONSILLECTOMY  1958   TOTAL HIP ARTHROPLASTY Left 2008   TOTAL HIP ARTHROPLASTY Right 01/10/2017   Procedure: TOTAL HIP ARTHROPLASTY ANTERIOR APPROACH;  Surgeon: Kennedy Bucker, MD;  Location: [facility] ORS;  Service: Orthopedics;  Laterality: Right;    Family Psychiatric History: I have reviewed family psychiatric history from progress note on 04/28/2020.  Family History:  Family History  Problem Relation Age of Onset   Parkinson's disease Mother    CAD Father    Hypertension Father    Anxiety disorder Daughter    Depression Daughter    Suicidality Daughter    Alcohol abuse Daughter    Anxiety disorder Daughter    Anxiety disorder Son    Breast cancer Neg Hx     Social History: I have reviewed social history from progress note on 04/28/2020. Social History   Socioeconomic History   Marital status: Married    Spouse name: Not on file   Number of children: Not on file   Years of education: Not on file   Highest education level: Not on file  Occupational History   Not on file  Tobacco Use   Smoking status: Former    Types: Cigarettes   Smokeless tobacco: Never   Tobacco comments:    Smoked in college.   Vaping Use   Vaping status: Never Used  Substance and Sexual Activity   Alcohol use: Not Currently    Comment: HEAVY USE IN THE PAST- QUIT 3 YEARS AGO   Drug use: No   Sexual activity: Yes  Other Topics Concern   Not on file  Social History Narrative   Not on file   Social Determinants of Health   Financial Resource Strain: Low Risk  (02/09/2023)   Received from [facility], [facility]   Overall Financial Resource Strain (CARDIA)    Difficulty of Paying Living Expenses: Not hard at all  Food Insecurity: No Food Insecurity (02/09/2023)   Received from [facility], [facility]   Hunger Vital Sign    Worried About  Running Out of Food in the Last Year: Never true    Ran Out of Food in the Last Year: Never true  Transportation Needs: No Transportation Needs (02/09/2023)   Received from [facility], [facility]   [facility] - Transportation    In the past 12 months, has lack of transportation kept you from medical appointments or from getting medications?: No    Lack of Transportation (Non-Medical): No  Physical Activity: Not on file  Stress: Not on file  Social Connections: Unknown (08/23/2022)   Received from [facility], [facility]   Social Network    Social  Network: Not on file    Allergies:  Allergies  Allergen Reactions   Budesonide-Formoterol Fumarate     Other reaction(s): Unknown   Abilify [Aripiprazole] Anxiety    SEVERE HYPERACTIVITY   Duloxetine Anxiety    Metabolic Disorder Labs: No results found for: "HGBA1C", "MPG" No results found for: "PROLACTIN" No results found for: "CHOL", "TRIG", "HDL", "CHOLHDL", "VLDL", "LDLCALC" Lab Results  Component Value Date   TSH 0.95 09/18/2013    Therapeutic Level Labs: No results found for: "LITHIUM" No results found for: "VALPROATE" No results found for: "CBMZ"  Current Medications: Current Outpatient Medications  Medication Sig Dispense Refill   acetaminophen (TYLENOL) 650 MG CR tablet Take 650 mg by mouth every 8 (eight) hours as needed for pain.     amoxicillin (AMOXIL) 500 MG capsule Take 500 mg by mouth as directed. Prior to dental procedure     Cholecalciferol (VITAMIN D-3) 5000 units TABS Take 1 tablet by mouth daily.     clonazePAM (KLONOPIN) 0.5 MG tablet TAKE 1/2 TO 1 TABLET(0.25 TO 0.5 MG) BY MOUTH DAILY IN THE MORNING AND AT BEDTIME AS DIRECTED AS NEEDED FOR ANXIETY 60 tablet 2   donepezil (ARICEPT) 5 MG tablet Take 10 tablets by mouth at bedtime.     folic acid (FOLVITE) 800 MCG tablet Take 400 mcg by mouth daily.     gabapentin (NEURONTIN) 100 MG capsule Take 200 mg by mouth 3  (three) times daily.     magnesium 30 MG tablet Take 250 mg by mouth daily.     pantoprazole (PROTONIX) 40 MG tablet Take 40 mg by mouth daily.     polyethylene glycol (MIRALAX / GLYCOLAX) 17 g packet Take 17 g by mouth daily.     pravastatin (PRAVACHOL) 40 MG tablet Take 40 mg by mouth at bedtime.     propranolol ER (INDERAL LA) 60 MG 24 hr capsule Take 60 mg by mouth daily. Takes differently     QUEtiapine (SEROQUEL) 100 MG tablet Take 1 tablet (100 mg total) by mouth at bedtime. Take along with 125 mg daily. 30 tablet 2   QUEtiapine (SEROQUEL) 25 MG tablet TAKE 1 TABLET(25 MG) BY MOUTH AT BEDTIME. START TAKING ALONG WITH 100 MG, TOTAL OF 125 MG DAILY AT BEDTIME 30 tablet 1   Simethicone (GAS-X EXTRA STRENGTH) 125 MG CAPS Take 125 mg by mouth. Twice a day as needed     tobramycin (TOBREX) 0.3 % ophthalmic solution Place 1 drop into the left eye every 6 (six) hours. 5 mL 0   traZODone (DESYREL) 100 MG tablet TAKE 1 TO 1 AND 1/2 TABLETS(100 TO 150 MG) BY MOUTH AT BEDTIME AS NEEDED FOR SLEEP 45 tablet 2   TRINTELLIX 10 MG TABS tablet TAKE 1 TABLET(10 MG) BY MOUTH DAILY. STOP TRINTELLIX 20 MG 30 tablet 2   UNABLE TO FIND 1 Device by Other route as directed. 1 Device O   vitamin B-12 (CYANOCOBALAMIN) 1000 MCG tablet Take 1,000 mcg by mouth daily.     No current facility-administered medications for this visit.     Musculoskeletal: Strength & Muscle Tone: within normal limits Gait & Station: shuffle Patient leans: N/A  Psychiatric Specialty Exam: Review of Systems  Unable to perform ROS: Psychiatric disorder    Blood pressure 119/79, pulse 77, temperature (!) 97.5 F (36.4 C), temperature source Skin, height 5\' 7"  (1.702 m), weight 189 lb (85.7 kg).Body mass index is 29.6 kg/m.  General Appearance: Casual  Eye Contact:  Fair  Speech:  Slow  Volume:  Decreased  Mood:  Euthymic  Affect:  Appropriate  Thought Process:  Goal Directed and Descriptions of Associations: Intact   Orientation:  Full (Time, Place, and Person)  Thought Content: Logical   Suicidal Thoughts:  No  Homicidal Thoughts:  No  Memory:  Immediate;   Fair Recent;   Poor Remote;   Poor  Judgement:  Fair  Insight:  Shallow  Psychomotor Activity:  Shuffling Gait  Concentration:  Concentration: Poor and Attention Span: Poor  Recall:  Poor  Fund of Knowledge: Fair  Language: Fair  Akathisia:  No  Handed:  Right  AIMS (if indicated): not done  Assets:  Communication Skills Desire for Improvement Financial Resources/Insurance Housing Intimacy  ADL's:  Intact  Cognition: baseline  Sleep:   Excessive during the day, noncompliant on CPAP   Screenings: AIMS    Loss adjuster, chartered Office Visit from 02/20/2023 in [facility] Office Visit from 11/03/2022 in [facility] Office Visit from 09/15/2022 in [facility] Office Visit from 07/18/2022 in [facility] Office Visit from 06/20/2022 in [facility]  AIMS Total Score 0 0 0 0 0      GAD-7    Flowsheet Row Office Visit from 11/03/2022 in [facility] Office Visit from 12/08/2021 in [facility]  Total GAD-7 Score 8 1      PHQ2-9    Flowsheet Row Office Visit from 11/03/2022 in [facility] Office Visit from 09[facility]15/2022 in [facility] Office Visit from 07/18/2022 in [facility] Office Visit from 06/20/2022 in [facility] Office Visit from 05/19/2022 in [facility]  PHQ-2 Total Score 1 4 2 1 4   PHQ-9 Total Score 7 9 4 5 16       Flowsheet Row Office Visit from 05/23/2023 in [facility] Office Visit from 02/20/2023 in [facility] Office Visit from 01/02/2023 in [facility]  C-SSRS RISK CATEGORY No Risk No Risk No Risk        Assessment and Plan: CHI CRISE Jodi Parrish is a 77 year old Caucasian female, married, has a history of major neurocognitive disorder, MDD, panic disorder, obstructive sleep apnea noncompliant on CPAP was evaluated in office today.  Patient is currently improving with regards to her depression, anxiety although continues to have memory problems, chronic, progressing, currently under the care of neurology, will benefit from continued support, currently lives in a senior living community, has good support system from spouse, will benefit from the following plan.  Plan Major neurocognitive disorder-chronic-progressive Neuropsychological testing-06/26/2020-Per Dr. Roseanne Reno.   Patient had another repeat initial evaluation for neuropsychological testing by Dr. Kieth Brightly her patient canceled the actual testing. Continue Aricept 10 mg p.o. daily per Dr. Malvin Johns.  GAD-improving Klonopin 0.25 -0.5 mg p.o. twice daily as needed for anxiety Gabapentin 200 mg p.o. 3 times daily-prescribed by primary care provider Seroquel 125 mg p.o. nightly   MDD-in remission Trintellix 10 mg p.o. daily Seroquel 125 mg p.o. nightly  Obstructive sleep apnea-unstable Patient noncompliant with CPAP, patient is not interested in using it.  Attempted counseling and education.   Alcohol use disorder in remission Will monitor closely, currently sober   Patient will benefit from repeat TSH-patient  has upcoming appointment with primary care provider.  If not we will consider ordering this at next visit.  Collaboration of Care: Collaboration of Care: Other collateral and family history obtained from spouse as noted above.  Patient encouraged to continue follow-up  with neurology.  Patient to continue psychotherapy at the [facility]-I have advised patient to sign an ROI to communicate with therapist.  Patient/Guardian was advised Release of Information must be obtained prior to any record release in order to collaborate their care with an outside provider. Patient/Guardian was advised if they have not already done so to contact the registration department to sign all necessary forms in order for [place] to release information regarding their care.   Consent: Patient/Guardian gives verbal consent for treatment and assignment of benefits for services provided during this visit. Patient/Guardian expressed understanding and agreed to proceed.     Follow-up in clinic in 3 months or sooner if needed.   I have spent atleast 40 minutes face to face with patient today which includes the time spent for preparing to see the patient ( e.g., review of test, records ), obtaining and to review and separately obtained history , ordering medications and test ,psychoeducation and supportive psychotherapy and care coordination,as well as documenting clinical information in electronic health record,interpreting and communication of test results  This note was generated in part or whole with voice recognition software. Voice recognition is usually quite accurate but there are transcription errors that can and very often do occur. I apologize for any typographical errors that were not detected and corrected.    Jomarie Longs, MD 05/24/2023, 1:06 PM

## 2023-07-18 ENCOUNTER — Other Ambulatory Visit: Payer: Self-pay | Admitting: Psychiatry

## 2023-07-18 DIAGNOSIS — Z8659 Personal history of other mental and behavioral disorders: Secondary | ICD-10-CM

## 2023-08-22 ENCOUNTER — Other Ambulatory Visit: Payer: Self-pay | Admitting: Psychiatry

## 2023-08-22 ENCOUNTER — Ambulatory Visit: Payer: Medicare Other | Admitting: Psychiatry

## 2023-08-22 DIAGNOSIS — F410 Panic disorder [episodic paroxysmal anxiety] without agoraphobia: Secondary | ICD-10-CM

## 2023-08-27 ENCOUNTER — Other Ambulatory Visit: Payer: Self-pay | Admitting: Psychiatry

## 2023-08-27 DIAGNOSIS — Z8659 Personal history of other mental and behavioral disorders: Secondary | ICD-10-CM

## 2023-08-27 DIAGNOSIS — F3341 Major depressive disorder, recurrent, in partial remission: Secondary | ICD-10-CM

## 2023-08-27 DIAGNOSIS — F410 Panic disorder [episodic paroxysmal anxiety] without agoraphobia: Secondary | ICD-10-CM

## 2023-08-27 DIAGNOSIS — F0390 Unspecified dementia without behavioral disturbance: Secondary | ICD-10-CM

## 2023-08-30 ENCOUNTER — Ambulatory Visit: Payer: Medicare Other | Admitting: Psychology

## 2023-09-04 ENCOUNTER — Other Ambulatory Visit: Payer: Self-pay | Admitting: Gastroenterology

## 2023-09-04 DIAGNOSIS — R1013 Epigastric pain: Secondary | ICD-10-CM

## 2023-09-04 DIAGNOSIS — R1310 Dysphagia, unspecified: Secondary | ICD-10-CM

## 2023-09-04 DIAGNOSIS — R140 Abdominal distension (gaseous): Secondary | ICD-10-CM

## 2023-09-04 DIAGNOSIS — K219 Gastro-esophageal reflux disease without esophagitis: Secondary | ICD-10-CM

## 2023-09-04 DIAGNOSIS — R110 Nausea: Secondary | ICD-10-CM

## 2023-09-06 ENCOUNTER — Ambulatory Visit
Admission: RE | Admit: 2023-09-06 | Discharge: 2023-09-06 | Disposition: A | Discharge status: Home / Self Care | Payer: Medicare Other | Source: Ambulatory Visit | Attending: Gastroenterology | Admitting: Gastroenterology

## 2023-09-06 DIAGNOSIS — K219 Gastro-esophageal reflux disease without esophagitis: Secondary | ICD-10-CM | POA: Insufficient documentation

## 2023-09-06 DIAGNOSIS — R1013 Epigastric pain: Secondary | ICD-10-CM | POA: Insufficient documentation

## 2023-09-06 DIAGNOSIS — R140 Abdominal distension (gaseous): Secondary | ICD-10-CM | POA: Insufficient documentation

## 2023-09-06 DIAGNOSIS — R1310 Dysphagia, unspecified: Secondary | ICD-10-CM | POA: Diagnosis present

## 2023-09-06 DIAGNOSIS — R110 Nausea: Secondary | ICD-10-CM | POA: Insufficient documentation

## 2023-10-14 ENCOUNTER — Other Ambulatory Visit: Payer: Self-pay | Admitting: Psychiatry

## 2023-10-14 DIAGNOSIS — Z8659 Personal history of other mental and behavioral disorders: Secondary | ICD-10-CM

## 2023-10-31 ENCOUNTER — Other Ambulatory Visit: Payer: Self-pay | Admitting: Gastroenterology

## 2023-10-31 DIAGNOSIS — R1013 Epigastric pain: Secondary | ICD-10-CM

## 2023-10-31 DIAGNOSIS — R110 Nausea: Secondary | ICD-10-CM

## 2023-11-13 ENCOUNTER — Ambulatory Visit
Admission: RE | Admit: 2023-11-13 | Discharge: 2023-11-13 | Disposition: A | Discharge status: Home / Self Care | Payer: Medicare Other | Source: Ambulatory Visit | Attending: Gastroenterology | Admitting: Gastroenterology

## 2023-11-13 DIAGNOSIS — R110 Nausea: Secondary | ICD-10-CM | POA: Insufficient documentation

## 2023-11-13 DIAGNOSIS — R1013 Epigastric pain: Secondary | ICD-10-CM | POA: Diagnosis present

## 2023-11-15 ENCOUNTER — Other Ambulatory Visit: Payer: Self-pay | Admitting: Gastroenterology

## 2023-11-15 DIAGNOSIS — R1013 Epigastric pain: Secondary | ICD-10-CM

## 2023-11-15 DIAGNOSIS — R110 Nausea: Secondary | ICD-10-CM

## 2023-11-16 ENCOUNTER — Other Ambulatory Visit: Payer: Self-pay | Admitting: Psychiatry

## 2023-11-16 DIAGNOSIS — Z8659 Personal history of other mental and behavioral disorders: Secondary | ICD-10-CM

## 2023-11-18 ENCOUNTER — Other Ambulatory Visit: Payer: Self-pay | Admitting: Psychiatry

## 2023-11-18 DIAGNOSIS — F410 Panic disorder [episodic paroxysmal anxiety] without agoraphobia: Secondary | ICD-10-CM

## 2023-11-18 DIAGNOSIS — F3341 Major depressive disorder, recurrent, in partial remission: Secondary | ICD-10-CM

## 2023-11-18 DIAGNOSIS — Z8659 Personal history of other mental and behavioral disorders: Secondary | ICD-10-CM

## 2023-11-18 DIAGNOSIS — F0390 Unspecified dementia without behavioral disturbance: Secondary | ICD-10-CM

## 2023-11-21 ENCOUNTER — Ambulatory Visit
Admission: RE | Admit: 2023-11-21 | Discharge: 2023-11-21 | Disposition: A | Discharge status: Home / Self Care | Payer: Medicare Other | Source: Ambulatory Visit | Attending: Gastroenterology | Admitting: Gastroenterology

## 2023-11-21 DIAGNOSIS — R1013 Epigastric pain: Secondary | ICD-10-CM | POA: Diagnosis present

## 2023-11-21 DIAGNOSIS — R110 Nausea: Secondary | ICD-10-CM | POA: Insufficient documentation

## 2023-11-21 MED ORDER — TECHNETIUM TC 99M MEBROFENIN IV KIT
5.0600 | PACK | Freq: Once | INTRAVENOUS | Status: AC | PRN
  Administered 2023-11-21: 5.06 via INTRAVENOUS

## 2023-11-24 ENCOUNTER — Other Ambulatory Visit: Payer: Self-pay | Admitting: Gastroenterology

## 2023-11-24 DIAGNOSIS — R112 Nausea with vomiting, unspecified: Secondary | ICD-10-CM

## 2023-11-24 DIAGNOSIS — R110 Nausea: Secondary | ICD-10-CM

## 2023-11-24 DIAGNOSIS — R1013 Epigastric pain: Secondary | ICD-10-CM

## 2023-12-01 ENCOUNTER — Other Ambulatory Visit

## 2023-12-01 ENCOUNTER — Ambulatory Visit
Admission: RE | Admit: 2023-12-01 | Discharge: 2023-12-01 | Disposition: A | Discharge status: Home / Self Care | Source: Ambulatory Visit | Attending: Gastroenterology | Admitting: Gastroenterology

## 2023-12-01 DIAGNOSIS — R112 Nausea with vomiting, unspecified: Secondary | ICD-10-CM | POA: Insufficient documentation

## 2023-12-01 DIAGNOSIS — R1013 Epigastric pain: Secondary | ICD-10-CM | POA: Insufficient documentation

## 2023-12-01 DIAGNOSIS — R110 Nausea: Secondary | ICD-10-CM | POA: Diagnosis present

## 2023-12-01 MED ORDER — IOHEXOL 300 MG/ML  SOLN
100.0000 mL | Freq: Once | INTRAMUSCULAR | Status: AC | PRN
  Administered 2023-12-01: 100 mL via INTRAVENOUS

## 2023-12-16 ENCOUNTER — Other Ambulatory Visit: Payer: Self-pay | Admitting: Psychiatry

## 2023-12-16 DIAGNOSIS — F410 Panic disorder [episodic paroxysmal anxiety] without agoraphobia: Secondary | ICD-10-CM

## 2023-12-16 DIAGNOSIS — F3341 Major depressive disorder, recurrent, in partial remission: Secondary | ICD-10-CM

## 2023-12-16 DIAGNOSIS — Z8659 Personal history of other mental and behavioral disorders: Secondary | ICD-10-CM

## 2023-12-16 DIAGNOSIS — F0390 Unspecified dementia without behavioral disturbance: Secondary | ICD-10-CM

## 2024-04-29 ENCOUNTER — Other Ambulatory Visit: Payer: Self-pay | Admitting: Psychiatry

## 2024-04-29 DIAGNOSIS — F410 Panic disorder [episodic paroxysmal anxiety] without agoraphobia: Secondary | ICD-10-CM

## 2024-04-29 DIAGNOSIS — F3341 Major depressive disorder, recurrent, in partial remission: Secondary | ICD-10-CM

## 2024-04-29 DIAGNOSIS — F0390 Unspecified dementia without behavioral disturbance: Secondary | ICD-10-CM

## 2024-04-29 DIAGNOSIS — Z8659 Personal history of other mental and behavioral disorders: Secondary | ICD-10-CM

## 2024-06-11 ENCOUNTER — Other Ambulatory Visit: Payer: Self-pay

## 2024-06-11 ENCOUNTER — Emergency Department
Admission: EM | Admit: 2024-06-11 | Discharge: 2024-06-11 | Disposition: A | Discharge status: Home / Self Care | Attending: Emergency Medicine | Admitting: Emergency Medicine

## 2024-06-11 DIAGNOSIS — R109 Unspecified abdominal pain: Secondary | ICD-10-CM | POA: Diagnosis present

## 2024-06-11 DIAGNOSIS — F419 Anxiety disorder, unspecified: Secondary | ICD-10-CM | POA: Insufficient documentation

## 2024-06-11 DIAGNOSIS — E876 Hypokalemia: Secondary | ICD-10-CM | POA: Insufficient documentation

## 2024-06-11 LAB — CBC
HCT: 43.2 % (ref 36.0–46.0)
Hemoglobin: 14.4 g/dL (ref 12.0–15.0)
MCH: 31.2 pg (ref 26.0–34.0)
MCHC: 33.3 g/dL (ref 30.0–36.0)
MCV: 93.7 fL (ref 80.0–100.0)
Platelets: 256 K/uL (ref 150–400)
RBC: 4.61 MIL/uL (ref 3.87–5.11)
RDW: 13.6 % (ref 11.5–15.5)
WBC: 7.2 K/uL (ref 4.0–10.5)
nRBC: 0 % (ref 0.0–0.2)

## 2024-06-11 LAB — COMPREHENSIVE METABOLIC PANEL WITH GFR
ALT: 17 U/L (ref 0–44)
AST: 26 U/L (ref 15–41)
Albumin: 4.4 g/dL (ref 3.5–5.0)
Alkaline Phosphatase: 67 U/L (ref 38–126)
Anion gap: 13 (ref 5–15)
BUN: 12 mg/dL (ref 8–23)
CO2: 23 mmol/L (ref 22–32)
Calcium: 10.3 mg/dL (ref 8.9–10.3)
Chloride: 103 mmol/L (ref 98–111)
Creatinine, Ser: 0.84 mg/dL (ref 0.44–1.00)
GFR, Estimated: >60 mL/min (ref >60)
Glucose, Bld: 108 mg/dL — ABNORMAL HIGH (ref 70–99)
Potassium: 3.3 mmol/L — ABNORMAL LOW (ref 3.5–5.1)
Sodium: 139 mmol/L (ref 135–145)
Total Bilirubin: 1.1 mg/dL (ref 0.0–1.2)
Total Protein: 7.4 g/dL (ref 6.5–8.1)

## 2024-06-11 LAB — LIPASE, BLOOD: Lipase: 35 U/L (ref 11–51)

## 2024-06-11 MED ORDER — DICYCLOMINE HCL 10 MG PO CAPS
10.0000 mg | ORAL_CAPSULE | Freq: Once | ORAL | Status: AC
  Administered 2024-06-11: 10 mg via ORAL
  Filled 2024-06-11: qty 1

## 2024-06-11 NOTE — ED Provider Notes (Signed)
 [facility] Psychiatric Provider Note    Event Date/Time   First MD Initiated Contact with Patient 06/11/24 1533     (approximate)   History   Abdominal pain   HPI  ROSABELL GEYER Jodi Parrish is a 78 y.o. female presents to the emergency department today accompanied by husband because of concerns for continued abdominal pain.  History is primarily obtained from husband.  He does state that they are thinking the patient herself has dementia.  He states that she has been having daily abdominal pain for months.  It does wax and wane.  He says the only thing that makes it better is clonazepam .  She also has issues with anxiety.  She has seen and followed up with GI doctor seeing them last week.  Additionally she is seeing psychiatry as an outpatient.      Physical Exam   Triage Vital Signs: ED Triage Vitals  Encounter Vitals Group     BP 06/11/24 1345 (!) 148/100     Girls Systolic BP Percentile --      Girls Diastolic BP Percentile --      Boys Systolic BP Percentile --      Boys Diastolic BP Percentile --      Pulse Rate 06/11/24 1345 96     Resp 06/11/24 1345 18     Temp 06/11/24 1345 98.5 F (36.9 C)     Temp Source 06/11/24 1345 Oral     SpO2 06/11/24 1345 98 %     Weight 06/11/24 1346 173 lb (78.5 kg)     Height 06/11/24 1346 5' 8 (1.727 m)     Head Circumference --      Peak Flow --      Pain Score 06/11/24 1345 0     Pain Loc --      Pain Education --      Exclude from Growth Chart --     Most recent vital signs: Vitals:   06/11/24 1345  BP: (!) 148/100  Pulse: 96  Resp: 18  Temp: 98.5 F (36.9 C)  SpO2: 98%   General: Awake, alert, not oriented. CV:  Good peripheral perfusion. Regular rate and rhythm. Resp:  Normal effort. Lungs clear. Abd:  No distention. Non tender.   ED Results / Procedures / Treatments   Labs (all labs ordered are listed, but only abnormal results are displayed) Labs Reviewed  COMPREHENSIVE METABOLIC PANEL WITH GFR -  Abnormal; Notable for the following components:      Result Value   Potassium 3.3 (*)    Glucose, Bld 108 (*)    All other components within normal limits  CBC  LIPASE, BLOOD  URINALYSIS, ROUTINE W REFLEX MICROSCOPIC     EKG  None   RADIOLOGY None  PROCEDURES:  Critical Care performed: No  MEDICATIONS ORDERED IN ED: Medications - No data to display   IMPRESSION / MDM / ASSESSMENT AND PLAN / ED COURSE  I reviewed the triage vital signs and the nursing notes.                              Differential diagnosis includes, but is not limited to, chronic abdominal pain, intraabdominal infection, pancreatitis, gastritis  Patient's presentation is most consistent with acute presentation with potential threat to life or bodily function.  Patient presented to the emergency department today companied by husband because of concerns for continued abdominal pain and some  anxiety.  It is somewhat unclear to myself why they decided to come to the emergency department today for this.  They do follow-up with both psychiatry and GI as outpatients.  Blood work was checked without any concerning leukocytosis.  No anemia or concerning electrolyte abnormality.  Patient did feel somewhat improved after Bentyl .  At this time I think is reasonable for patient be discharged to continue workup with outpatient providers.     FINAL CLINICAL IMPRESSION(S) / ED DIAGNOSES   Final diagnoses:  Abdominal pain, unspecified abdominal location      Note:  This document was prepared using Dragon voice recognition software and may include unintentional dictation errors.    Floy Roberts, MD 06/11/24 2121

## 2024-06-11 NOTE — ED Triage Notes (Signed)
 Pt husband sts that pt has been complaining of abd pain for the last several weeks. Husband sts that pt has been confused for the last several months also which pt is seeing psychiatry for. Pt is very anxious at this time and it not able to complete sentences or thoughts just sts  I dont know why I am like this.
